# Patient Record
Sex: Female | Born: 1945 | Race: White | Hispanic: No | Marital: Married | Smoking: Former smoker
Health system: Southern US, Community
[De-identification: ages and names within clinical notes are randomized; demographics above are authoritative.]

## PROBLEM LIST (undated history)

## (undated) DIAGNOSIS — R5381 Other malaise: Secondary | ICD-10-CM

## (undated) DIAGNOSIS — K589 Irritable bowel syndrome without diarrhea: Secondary | ICD-10-CM

## (undated) DIAGNOSIS — R252 Cramp and spasm: Secondary | ICD-10-CM

## (undated) DIAGNOSIS — R569 Unspecified convulsions: Secondary | ICD-10-CM

## (undated) DIAGNOSIS — G4700 Insomnia, unspecified: Secondary | ICD-10-CM

## (undated) DIAGNOSIS — E119 Type 2 diabetes mellitus without complications: Secondary | ICD-10-CM

## (undated) DIAGNOSIS — Z8619 Personal history of other infectious and parasitic diseases: Secondary | ICD-10-CM

## (undated) DIAGNOSIS — IMO0001 Reserved for inherently not codable concepts without codable children: Secondary | ICD-10-CM

## (undated) DIAGNOSIS — C50919 Malignant neoplasm of unspecified site of unspecified female breast: Secondary | ICD-10-CM

## (undated) DIAGNOSIS — J069 Acute upper respiratory infection, unspecified: Secondary | ICD-10-CM

## (undated) DIAGNOSIS — R5383 Other fatigue: Secondary | ICD-10-CM

## (undated) DIAGNOSIS — F329 Major depressive disorder, single episode, unspecified: Secondary | ICD-10-CM

## (undated) DIAGNOSIS — Z853 Personal history of malignant neoplasm of breast: Secondary | ICD-10-CM

## (undated) DIAGNOSIS — I10 Essential (primary) hypertension: Secondary | ICD-10-CM

## (undated) DIAGNOSIS — K648 Other hemorrhoids: Secondary | ICD-10-CM

## (undated) DIAGNOSIS — K222 Esophageal obstruction: Secondary | ICD-10-CM

## (undated) DIAGNOSIS — J189 Pneumonia, unspecified organism: Secondary | ICD-10-CM

## (undated) DIAGNOSIS — K5792 Diverticulitis of intestine, part unspecified, without perforation or abscess without bleeding: Secondary | ICD-10-CM

## (undated) DIAGNOSIS — E538 Deficiency of other specified B group vitamins: Secondary | ICD-10-CM

## (undated) DIAGNOSIS — R079 Chest pain, unspecified: Secondary | ICD-10-CM

## (undated) DIAGNOSIS — G2581 Restless legs syndrome: Secondary | ICD-10-CM

## (undated) DIAGNOSIS — F3289 Other specified depressive episodes: Secondary | ICD-10-CM

## (undated) DIAGNOSIS — M1990 Unspecified osteoarthritis, unspecified site: Secondary | ICD-10-CM

## (undated) DIAGNOSIS — R51 Headache: Secondary | ICD-10-CM

## (undated) DIAGNOSIS — IMO0002 Reserved for concepts with insufficient information to code with codable children: Secondary | ICD-10-CM

## (undated) DIAGNOSIS — M792 Neuralgia and neuritis, unspecified: Secondary | ICD-10-CM

## (undated) DIAGNOSIS — K449 Diaphragmatic hernia without obstruction or gangrene: Secondary | ICD-10-CM

## (undated) DIAGNOSIS — F411 Generalized anxiety disorder: Secondary | ICD-10-CM

## (undated) HISTORY — DX: Deficiency of other specified B group vitamins: E53.8

## (undated) HISTORY — DX: Acute upper respiratory infection, unspecified: J06.9

## (undated) HISTORY — DX: Restless legs syndrome: G25.81

## (undated) HISTORY — DX: Diaphragmatic hernia without obstruction or gangrene: K44.9

## (undated) HISTORY — PX: ABDOMINAL HYSTERECTOMY: SHX81

## (undated) HISTORY — DX: Neuralgia and neuritis, unspecified: M79.2

## (undated) HISTORY — DX: Generalized anxiety disorder: F41.1

## (undated) HISTORY — DX: Other specified depressive episodes: F32.89

## (undated) HISTORY — DX: Reserved for inherently not codable concepts without codable children: IMO0001

## (undated) HISTORY — PX: MASTECTOMY: SHX3

## (undated) HISTORY — DX: Unspecified osteoarthritis, unspecified site: M19.90

## (undated) HISTORY — DX: Chest pain, unspecified: R07.9

## (undated) HISTORY — DX: Personal history of other infectious and parasitic diseases: Z86.19

## (undated) HISTORY — DX: Esophageal obstruction: K22.2

## (undated) HISTORY — DX: Insomnia, unspecified: G47.00

## (undated) HISTORY — PX: OTHER SURGICAL HISTORY: SHX169

## (undated) HISTORY — PX: TONSILLECTOMY: SUR1361

## (undated) HISTORY — DX: Other malaise: R53.81

## (undated) HISTORY — DX: Other hemorrhoids: K64.8

## (undated) HISTORY — DX: Type 2 diabetes mellitus without complications: E11.9

## (undated) HISTORY — DX: Major depressive disorder, single episode, unspecified: F32.9

## (undated) HISTORY — DX: Malignant neoplasm of unspecified site of unspecified female breast: C50.919

## (undated) HISTORY — DX: Diverticulitis of intestine, part unspecified, without perforation or abscess without bleeding: K57.92

## (undated) HISTORY — DX: Essential (primary) hypertension: I10

## (undated) HISTORY — DX: Cramp and spasm: R25.2

## (undated) HISTORY — DX: Other fatigue: R53.83

## (undated) HISTORY — DX: Irritable bowel syndrome, unspecified: K58.9

## (undated) HISTORY — PX: CHOLECYSTECTOMY: SHX55

## (undated) HISTORY — DX: Headache: R51

## (undated) HISTORY — DX: Personal history of malignant neoplasm of breast: Z85.3

## (undated) HISTORY — DX: Reserved for concepts with insufficient information to code with codable children: IMO0002

---

## 1998-05-31 ENCOUNTER — Ambulatory Visit: Admission: RE | Admit: 1998-05-31 | Discharge: 1998-05-31 | Payer: Self-pay | Admitting: Pulmonary Disease

## 1998-11-08 ENCOUNTER — Other Ambulatory Visit: Admission: RE | Admit: 1998-11-08 | Discharge: 1998-11-08 | Payer: Self-pay | Admitting: Gastroenterology

## 2001-09-15 ENCOUNTER — Encounter: Payer: Self-pay | Admitting: Internal Medicine

## 2001-09-15 ENCOUNTER — Ambulatory Visit (HOSPITAL_COMMUNITY): Admission: RE | Admit: 2001-09-15 | Discharge: 2001-09-15 | Payer: Self-pay | Admitting: Internal Medicine

## 2002-12-29 ENCOUNTER — Ambulatory Visit (HOSPITAL_COMMUNITY): Admission: RE | Admit: 2002-12-29 | Discharge: 2002-12-29 | Payer: Self-pay | Admitting: Gastroenterology

## 2002-12-29 ENCOUNTER — Encounter (INDEPENDENT_AMBULATORY_CARE_PROVIDER_SITE_OTHER): Payer: Self-pay | Admitting: Specialist

## 2002-12-29 ENCOUNTER — Encounter: Payer: Self-pay | Admitting: Gastroenterology

## 2003-09-09 ENCOUNTER — Encounter: Payer: Self-pay | Admitting: Internal Medicine

## 2003-09-09 ENCOUNTER — Encounter: Admission: RE | Admit: 2003-09-09 | Discharge: 2003-09-09 | Payer: Self-pay | Admitting: Internal Medicine

## 2004-08-16 ENCOUNTER — Ambulatory Visit (HOSPITAL_COMMUNITY): Admission: RE | Admit: 2004-08-16 | Discharge: 2004-08-16 | Payer: Self-pay | Admitting: Internal Medicine

## 2005-05-09 ENCOUNTER — Ambulatory Visit: Payer: Self-pay | Admitting: Internal Medicine

## 2005-06-05 ENCOUNTER — Ambulatory Visit (HOSPITAL_COMMUNITY): Admission: RE | Admit: 2005-06-05 | Discharge: 2005-06-05 | Payer: Self-pay | Admitting: Neurology

## 2005-07-10 ENCOUNTER — Ambulatory Visit: Payer: Self-pay | Admitting: Internal Medicine

## 2005-09-19 ENCOUNTER — Ambulatory Visit: Payer: Self-pay | Admitting: Internal Medicine

## 2006-08-19 ENCOUNTER — Ambulatory Visit: Payer: Self-pay | Admitting: Internal Medicine

## 2006-08-19 ENCOUNTER — Observation Stay (HOSPITAL_COMMUNITY): Admission: AD | Admit: 2006-08-19 | Discharge: 2006-08-20 | Payer: Self-pay | Admitting: Internal Medicine

## 2006-08-19 ENCOUNTER — Ambulatory Visit: Payer: Self-pay | Admitting: Cardiology

## 2006-08-25 ENCOUNTER — Ambulatory Visit: Payer: Self-pay

## 2006-09-02 ENCOUNTER — Ambulatory Visit: Payer: Self-pay | Admitting: Cardiology

## 2006-09-21 ENCOUNTER — Encounter: Admission: RE | Admit: 2006-09-21 | Discharge: 2006-09-21 | Payer: Self-pay | Admitting: Radiology

## 2006-09-24 ENCOUNTER — Encounter (INDEPENDENT_AMBULATORY_CARE_PROVIDER_SITE_OTHER): Payer: Self-pay | Admitting: Specialist

## 2006-09-24 ENCOUNTER — Encounter: Admission: RE | Admit: 2006-09-24 | Discharge: 2006-09-24 | Payer: Self-pay | Admitting: Radiology

## 2006-11-03 ENCOUNTER — Ambulatory Visit (HOSPITAL_COMMUNITY): Admission: RE | Admit: 2006-11-03 | Discharge: 2006-11-05 | Payer: Self-pay | Admitting: Surgery

## 2006-11-03 ENCOUNTER — Encounter (INDEPENDENT_AMBULATORY_CARE_PROVIDER_SITE_OTHER): Payer: Self-pay | Admitting: Specialist

## 2006-11-03 ENCOUNTER — Encounter (INDEPENDENT_AMBULATORY_CARE_PROVIDER_SITE_OTHER): Payer: Self-pay | Admitting: Surgery

## 2006-11-18 ENCOUNTER — Ambulatory Visit: Payer: Self-pay | Admitting: Oncology

## 2006-12-11 LAB — COMPREHENSIVE METABOLIC PANEL
ALT: 16 U/L (ref 0–35)
Albumin: 4.4 g/dL (ref 3.5–5.2)
CO2: 27 mEq/L (ref 19–32)
Calcium: 9.3 mg/dL (ref 8.4–10.5)
Chloride: 106 mEq/L (ref 96–112)
Sodium: 142 mEq/L (ref 135–145)
Total Protein: 6.5 g/dL (ref 6.0–8.3)

## 2006-12-11 LAB — CBC WITH DIFFERENTIAL/PLATELET
BASO%: 0.4 % (ref 0.0–2.0)
Eosinophils Absolute: 0.2 10*3/uL (ref 0.0–0.5)
HCT: 38.4 % (ref 34.8–46.6)
MCHC: 34.2 g/dL (ref 32.0–36.0)
MONO#: 0.3 10*3/uL (ref 0.1–0.9)
NEUT#: 2.9 10*3/uL (ref 1.5–6.5)
RBC: 4.29 10*6/uL (ref 3.70–5.32)
WBC: 5.3 10*3/uL (ref 3.9–10.0)
lymph#: 1.8 10*3/uL (ref 0.9–3.3)

## 2006-12-11 LAB — CANCER ANTIGEN 27.29: CA 27.29: 27 U/mL (ref 0–39)

## 2007-03-09 ENCOUNTER — Ambulatory Visit: Payer: Self-pay | Admitting: Oncology

## 2007-03-12 LAB — CBC WITH DIFFERENTIAL/PLATELET
BASO%: 0.2 % (ref 0.0–2.0)
Basophils Absolute: 0 10*3/uL (ref 0.0–0.1)
EOS%: 1.7 % (ref 0.0–7.0)
MCH: 30.4 pg (ref 26.0–34.0)
MCHC: 34.6 g/dL (ref 32.0–36.0)
MCV: 87.9 fL (ref 81.0–101.0)
MONO%: 6.5 % (ref 0.0–13.0)
RBC: 4.53 10*6/uL (ref 3.70–5.32)
RDW: 13.5 % (ref 11.3–14.5)

## 2007-03-12 LAB — COMPREHENSIVE METABOLIC PANEL
AST: 25 U/L (ref 0–37)
Albumin: 4.2 g/dL (ref 3.5–5.2)
Alkaline Phosphatase: 68 U/L (ref 39–117)
BUN: 28 mg/dL — ABNORMAL HIGH (ref 6–23)
Potassium: 4 mEq/L (ref 3.5–5.3)

## 2007-05-20 ENCOUNTER — Ambulatory Visit: Payer: Self-pay | Admitting: Internal Medicine

## 2007-05-21 ENCOUNTER — Encounter: Payer: Self-pay | Admitting: Internal Medicine

## 2007-05-21 LAB — CONVERTED CEMR LAB
ALT: 20 units/L (ref 0–35)
Albumin: 3.3 g/dL — ABNORMAL LOW (ref 3.5–5.2)
Alkaline Phosphatase: 55 units/L (ref 39–117)
BUN: 22 mg/dL (ref 6–23)
Basophils Relative: 0.3 % (ref 0.0–1.0)
CO2: 29 meq/L (ref 19–32)
Calcium: 8.9 mg/dL (ref 8.4–10.5)
Crystals: NEGATIVE
Eosinophils Absolute: 0.1 10*3/uL (ref 0.0–0.6)
Eosinophils Relative: 2.5 % (ref 0.0–5.0)
GFR calc Af Amer: 81 mL/min
GFR calc non Af Amer: 67 mL/min
Ketones, ur: NEGATIVE mg/dL
LDL Cholesterol: 102 mg/dL — ABNORMAL HIGH (ref 0–99)
Mucus, UA: NEGATIVE
Platelets: 227 10*3/uL (ref 150–400)
RBC: 4.33 M/uL (ref 3.87–5.11)
RDW: 12.8 % (ref 11.5–14.6)
Total CHOL/HDL Ratio: 4.8
Triglycerides: 118 mg/dL (ref 0–149)
VLDL: 24 mg/dL (ref 0–40)
Vit D, 1,25-Dihydroxy: 41 (ref 20–57)
WBC: 5.5 10*3/uL (ref 4.5–10.5)

## 2007-06-01 ENCOUNTER — Ambulatory Visit: Payer: Self-pay | Admitting: Oncology

## 2007-06-03 LAB — CBC WITH DIFFERENTIAL/PLATELET
Basophils Absolute: 0 10*3/uL (ref 0.0–0.1)
EOS%: 1.1 % (ref 0.0–7.0)
HCT: 39.7 % (ref 34.8–46.6)
HGB: 13.9 g/dL (ref 11.6–15.9)
MCH: 31.1 pg (ref 26.0–34.0)
MCV: 88.4 fL (ref 81.0–101.0)
MONO%: 5.2 % (ref 0.0–13.0)
NEUT%: 62.7 % (ref 39.6–76.8)
RDW: 13.6 % (ref 11.3–14.5)

## 2007-06-03 LAB — COMPREHENSIVE METABOLIC PANEL
AST: 19 U/L (ref 0–37)
Alkaline Phosphatase: 59 U/L (ref 39–117)
BUN: 21 mg/dL (ref 6–23)
Creatinine, Ser: 1.28 mg/dL — ABNORMAL HIGH (ref 0.40–1.20)
Total Bilirubin: 0.6 mg/dL (ref 0.3–1.2)

## 2007-06-03 LAB — APTT: aPTT: 28 seconds (ref 24–37)

## 2007-06-09 ENCOUNTER — Inpatient Hospital Stay (HOSPITAL_COMMUNITY): Admission: RE | Admit: 2007-06-09 | Discharge: 2007-06-12 | Payer: Self-pay | Admitting: Plastic Surgery

## 2007-07-23 ENCOUNTER — Encounter: Payer: Self-pay | Admitting: Internal Medicine

## 2007-07-23 DIAGNOSIS — G2581 Restless legs syndrome: Secondary | ICD-10-CM | POA: Insufficient documentation

## 2007-07-23 DIAGNOSIS — IMO0002 Reserved for concepts with insufficient information to code with codable children: Secondary | ICD-10-CM | POA: Insufficient documentation

## 2007-07-23 DIAGNOSIS — M797 Fibromyalgia: Secondary | ICD-10-CM | POA: Insufficient documentation

## 2007-07-23 DIAGNOSIS — G4700 Insomnia, unspecified: Secondary | ICD-10-CM | POA: Insufficient documentation

## 2007-07-23 DIAGNOSIS — I10 Essential (primary) hypertension: Secondary | ICD-10-CM | POA: Insufficient documentation

## 2007-09-08 ENCOUNTER — Ambulatory Visit: Payer: Self-pay | Admitting: Oncology

## 2007-09-10 ENCOUNTER — Encounter: Payer: Self-pay | Admitting: Internal Medicine

## 2007-09-10 LAB — CBC WITH DIFFERENTIAL/PLATELET
Basophils Absolute: 0 10*3/uL (ref 0.0–0.1)
Eosinophils Absolute: 0.1 10*3/uL (ref 0.0–0.5)
HGB: 12.7 g/dL (ref 11.6–15.9)
LYMPH%: 27.4 % (ref 14.0–48.0)
MCV: 85.9 fL (ref 81.0–101.0)
MONO#: 0.3 10*3/uL (ref 0.1–0.9)
MONO%: 5.9 % (ref 0.0–13.0)
NEUT#: 3.8 10*3/uL (ref 1.5–6.5)
Platelets: 255 10*3/uL (ref 145–400)
RBC: 4.38 10*6/uL (ref 3.70–5.32)
RDW: 15.3 % — ABNORMAL HIGH (ref 11.3–14.5)
WBC: 5.9 10*3/uL (ref 3.9–10.0)

## 2007-11-25 ENCOUNTER — Ambulatory Visit: Payer: Self-pay | Admitting: Internal Medicine

## 2007-11-25 DIAGNOSIS — R252 Cramp and spasm: Secondary | ICD-10-CM | POA: Insufficient documentation

## 2007-11-25 DIAGNOSIS — F32A Depression, unspecified: Secondary | ICD-10-CM | POA: Insufficient documentation

## 2007-11-25 DIAGNOSIS — F329 Major depressive disorder, single episode, unspecified: Secondary | ICD-10-CM

## 2007-11-25 DIAGNOSIS — Z853 Personal history of malignant neoplasm of breast: Secondary | ICD-10-CM | POA: Insufficient documentation

## 2007-11-25 DIAGNOSIS — F419 Anxiety disorder, unspecified: Secondary | ICD-10-CM | POA: Insufficient documentation

## 2007-11-25 DIAGNOSIS — M1990 Unspecified osteoarthritis, unspecified site: Secondary | ICD-10-CM | POA: Insufficient documentation

## 2007-11-25 DIAGNOSIS — F411 Generalized anxiety disorder: Secondary | ICD-10-CM

## 2007-12-03 ENCOUNTER — Encounter: Admission: RE | Admit: 2007-12-03 | Discharge: 2007-12-03 | Payer: Self-pay | Admitting: Oncology

## 2008-02-11 ENCOUNTER — Ambulatory Visit: Payer: Self-pay | Admitting: Internal Medicine

## 2008-02-11 LAB — CONVERTED CEMR LAB
ALT: 18 units/L (ref 0–35)
Albumin: 3.3 g/dL — ABNORMAL LOW (ref 3.5–5.2)
Alkaline Phosphatase: 55 units/L (ref 39–117)
BUN: 20 mg/dL (ref 6–23)
CO2: 30 meq/L (ref 19–32)
Calcium: 8.6 mg/dL (ref 8.4–10.5)
Eosinophils Relative: 2.7 % (ref 0.0–5.0)
GFR calc Af Amer: 64 mL/min
Glucose, Bld: 131 mg/dL — ABNORMAL HIGH (ref 70–99)
HCT: 41.3 % (ref 36.0–46.0)
Hemoglobin: 13.3 g/dL (ref 12.0–15.0)
Monocytes Absolute: 0.4 10*3/uL (ref 0.2–0.7)
Monocytes Relative: 5.9 % (ref 3.0–11.0)
Neutro Abs: 4.1 10*3/uL (ref 1.4–7.7)
RBC: 4.6 M/uL (ref 3.87–5.11)
Total Protein: 6 g/dL (ref 6.0–8.3)
WBC: 6.9 10*3/uL (ref 4.5–10.5)

## 2008-02-23 ENCOUNTER — Ambulatory Visit: Payer: Self-pay | Admitting: Internal Medicine

## 2008-03-08 ENCOUNTER — Ambulatory Visit: Payer: Self-pay | Admitting: Oncology

## 2008-03-10 ENCOUNTER — Encounter: Payer: Self-pay | Admitting: Internal Medicine

## 2008-03-10 LAB — COMPREHENSIVE METABOLIC PANEL
Albumin: 4 g/dL (ref 3.5–5.2)
BUN: 18 mg/dL (ref 6–23)
CO2: 21 mEq/L (ref 19–32)
Glucose, Bld: 143 mg/dL — ABNORMAL HIGH (ref 70–99)
Sodium: 142 mEq/L (ref 135–145)
Total Bilirubin: 0.6 mg/dL (ref 0.3–1.2)
Total Protein: 6.7 g/dL (ref 6.0–8.3)

## 2008-03-10 LAB — CBC WITH DIFFERENTIAL/PLATELET
Basophils Absolute: 0 10*3/uL (ref 0.0–0.1)
Eosinophils Absolute: 0.1 10*3/uL (ref 0.0–0.5)
HCT: 40.7 % (ref 34.8–46.6)
HGB: 13.9 g/dL (ref 11.6–15.9)
LYMPH%: 28.2 % (ref 14.0–48.0)
MCV: 86.7 fL (ref 81.0–101.0)
MONO#: 0.3 10*3/uL (ref 0.1–0.9)
MONO%: 4.1 % (ref 0.0–13.0)
NEUT#: 4.3 10*3/uL (ref 1.5–6.5)
Platelets: 247 10*3/uL (ref 145–400)
RBC: 4.69 10*6/uL (ref 3.70–5.32)
WBC: 6.6 10*3/uL (ref 3.9–10.0)

## 2008-03-10 LAB — CANCER ANTIGEN 27.29: CA 27.29: 27 U/mL (ref 0–39)

## 2008-05-05 ENCOUNTER — Ambulatory Visit: Payer: Self-pay | Admitting: Internal Medicine

## 2008-05-05 LAB — CONVERTED CEMR LAB
BUN: 21 mg/dL (ref 6–23)
Calcium: 8.7 mg/dL (ref 8.4–10.5)
GFR calc Af Amer: 64 mL/min
GFR calc non Af Amer: 53 mL/min
Potassium: 4.3 meq/L (ref 3.5–5.1)
Sodium: 144 meq/L (ref 135–145)
Vitamin B-12: 668 pg/mL (ref 211–911)

## 2008-05-11 ENCOUNTER — Ambulatory Visit: Payer: Self-pay | Admitting: Internal Medicine

## 2008-08-16 ENCOUNTER — Ambulatory Visit: Payer: Self-pay | Admitting: Internal Medicine

## 2008-08-16 DIAGNOSIS — D510 Vitamin B12 deficiency anemia due to intrinsic factor deficiency: Secondary | ICD-10-CM | POA: Insufficient documentation

## 2008-08-16 DIAGNOSIS — R5382 Chronic fatigue, unspecified: Secondary | ICD-10-CM | POA: Insufficient documentation

## 2008-08-22 LAB — CONVERTED CEMR LAB
ALT: 22 units/L (ref 0–35)
AST: 27 units/L (ref 0–37)
Albumin: 3.7 g/dL (ref 3.5–5.2)
Alkaline Phosphatase: 57 units/L (ref 39–117)
BUN: 18 mg/dL (ref 6–23)
Bacteria, UA: NEGATIVE
Bilirubin Urine: NEGATIVE
CO2: 29 meq/L (ref 19–32)
Chloride: 105 meq/L (ref 96–112)
Creatinine, Ser: 1 mg/dL (ref 0.4–1.2)
Glucose, Bld: 124 mg/dL — ABNORMAL HIGH (ref 70–99)
Ketones, ur: NEGATIVE mg/dL
Leukocytes, UA: NEGATIVE
Potassium: 4.3 meq/L (ref 3.5–5.1)
Specific Gravity, Urine: 1.02 (ref 1.000–1.03)
Urine Glucose: NEGATIVE mg/dL
Vitamin B-12: 615 pg/mL (ref 211–911)
pH: 6 (ref 5.0–8.0)

## 2008-09-20 ENCOUNTER — Encounter: Payer: Self-pay | Admitting: Internal Medicine

## 2008-10-18 ENCOUNTER — Telehealth: Payer: Self-pay | Admitting: Internal Medicine

## 2008-11-01 ENCOUNTER — Ambulatory Visit: Payer: Self-pay | Admitting: Internal Medicine

## 2008-11-01 DIAGNOSIS — R0789 Other chest pain: Secondary | ICD-10-CM | POA: Insufficient documentation

## 2008-11-01 DIAGNOSIS — R079 Chest pain, unspecified: Secondary | ICD-10-CM

## 2008-11-01 DIAGNOSIS — J069 Acute upper respiratory infection, unspecified: Secondary | ICD-10-CM | POA: Insufficient documentation

## 2008-11-22 ENCOUNTER — Telehealth: Payer: Self-pay | Admitting: Internal Medicine

## 2008-11-23 ENCOUNTER — Telehealth: Payer: Self-pay | Admitting: Internal Medicine

## 2008-11-23 DIAGNOSIS — K648 Other hemorrhoids: Secondary | ICD-10-CM | POA: Insufficient documentation

## 2008-11-23 DIAGNOSIS — K222 Esophageal obstruction: Secondary | ICD-10-CM | POA: Insufficient documentation

## 2008-11-23 DIAGNOSIS — K589 Irritable bowel syndrome without diarrhea: Secondary | ICD-10-CM | POA: Insufficient documentation

## 2008-11-23 DIAGNOSIS — K449 Diaphragmatic hernia without obstruction or gangrene: Secondary | ICD-10-CM | POA: Insufficient documentation

## 2008-11-24 ENCOUNTER — Ambulatory Visit: Payer: Self-pay | Admitting: Gastroenterology

## 2008-11-25 ENCOUNTER — Telehealth (INDEPENDENT_AMBULATORY_CARE_PROVIDER_SITE_OTHER): Payer: Self-pay | Admitting: *Deleted

## 2008-11-30 ENCOUNTER — Ambulatory Visit: Payer: Self-pay | Admitting: Gastroenterology

## 2008-11-30 ENCOUNTER — Encounter: Payer: Self-pay | Admitting: Gastroenterology

## 2008-11-30 LAB — CONVERTED CEMR LAB
Creatinine, Ser: 1 mg/dL (ref 0.4–1.2)
UREASE: NEGATIVE

## 2008-12-01 ENCOUNTER — Telehealth: Payer: Self-pay | Admitting: Gastroenterology

## 2008-12-02 ENCOUNTER — Encounter: Payer: Self-pay | Admitting: Gastroenterology

## 2008-12-05 ENCOUNTER — Encounter: Payer: Self-pay | Admitting: Gastroenterology

## 2008-12-06 ENCOUNTER — Encounter: Payer: Self-pay | Admitting: Internal Medicine

## 2008-12-06 ENCOUNTER — Telehealth: Payer: Self-pay | Admitting: Internal Medicine

## 2008-12-06 ENCOUNTER — Ambulatory Visit: Payer: Self-pay | Admitting: Cardiology

## 2008-12-07 ENCOUNTER — Encounter: Payer: Self-pay | Admitting: Gastroenterology

## 2008-12-07 DIAGNOSIS — K8020 Calculus of gallbladder without cholecystitis without obstruction: Secondary | ICD-10-CM | POA: Insufficient documentation

## 2008-12-08 ENCOUNTER — Encounter: Payer: Self-pay | Admitting: Internal Medicine

## 2008-12-12 ENCOUNTER — Telehealth: Payer: Self-pay | Admitting: Gastroenterology

## 2008-12-28 ENCOUNTER — Ambulatory Visit (HOSPITAL_COMMUNITY): Admission: RE | Admit: 2008-12-28 | Discharge: 2008-12-29 | Payer: Self-pay | Admitting: Surgery

## 2008-12-28 ENCOUNTER — Encounter (INDEPENDENT_AMBULATORY_CARE_PROVIDER_SITE_OTHER): Payer: Self-pay | Admitting: Surgery

## 2009-01-27 ENCOUNTER — Encounter: Payer: Self-pay | Admitting: Internal Medicine

## 2009-01-27 ENCOUNTER — Encounter: Payer: Self-pay | Admitting: Gastroenterology

## 2009-02-08 ENCOUNTER — Ambulatory Visit: Payer: Self-pay | Admitting: Internal Medicine

## 2009-02-08 ENCOUNTER — Telehealth: Payer: Self-pay | Admitting: Gastroenterology

## 2009-03-10 ENCOUNTER — Ambulatory Visit: Payer: Self-pay | Admitting: Oncology

## 2009-03-14 ENCOUNTER — Encounter: Payer: Self-pay | Admitting: Internal Medicine

## 2009-03-14 LAB — COMPREHENSIVE METABOLIC PANEL
Alkaline Phosphatase: 63 U/L (ref 39–117)
BUN: 19 mg/dL (ref 6–23)
CO2: 27 mEq/L (ref 19–32)
Creatinine, Ser: 0.96 mg/dL (ref 0.40–1.20)
Glucose, Bld: 131 mg/dL — ABNORMAL HIGH (ref 70–99)
Sodium: 143 mEq/L (ref 135–145)
Total Bilirubin: 0.6 mg/dL (ref 0.3–1.2)
Total Protein: 6.2 g/dL (ref 6.0–8.3)

## 2009-03-14 LAB — CBC WITH DIFFERENTIAL/PLATELET
Eosinophils Absolute: 0.1 10*3/uL (ref 0.0–0.5)
HCT: 38.5 % (ref 34.8–46.6)
LYMPH%: 31.6 % (ref 14.0–49.7)
MCV: 92.1 fL (ref 79.5–101.0)
MONO#: 0.3 10*3/uL (ref 0.1–0.9)
MONO%: 5.4 % (ref 0.0–14.0)
NEUT#: 3 10*3/uL (ref 1.5–6.5)
NEUT%: 59.5 % (ref 38.4–76.8)
Platelets: 210 10*3/uL (ref 145–400)
RBC: 4.19 10*6/uL (ref 3.70–5.45)
WBC: 5 10*3/uL (ref 3.9–10.3)

## 2009-03-27 ENCOUNTER — Telehealth: Payer: Self-pay | Admitting: Internal Medicine

## 2009-03-28 ENCOUNTER — Ambulatory Visit: Payer: Self-pay | Admitting: Internal Medicine

## 2009-03-28 DIAGNOSIS — M542 Cervicalgia: Secondary | ICD-10-CM | POA: Insufficient documentation

## 2009-03-31 ENCOUNTER — Telehealth: Payer: Self-pay | Admitting: Internal Medicine

## 2009-04-13 ENCOUNTER — Telehealth: Payer: Self-pay | Admitting: Internal Medicine

## 2009-05-11 ENCOUNTER — Telehealth: Payer: Self-pay | Admitting: Internal Medicine

## 2009-05-31 ENCOUNTER — Ambulatory Visit: Payer: Self-pay | Admitting: Internal Medicine

## 2009-05-31 LAB — CONVERTED CEMR LAB
AST: 27 units/L (ref 0–37)
Albumin: 3.7 g/dL (ref 3.5–5.2)
Alkaline Phosphatase: 58 units/L (ref 39–117)
Basophils Absolute: 0 10*3/uL (ref 0.0–0.1)
Basophils Relative: 0.4 % (ref 0.0–3.0)
Bilirubin, Direct: 0.2 mg/dL (ref 0.0–0.3)
CO2: 31 meq/L (ref 19–32)
Calcium: 8.9 mg/dL (ref 8.4–10.5)
Eosinophils Absolute: 0.1 10*3/uL (ref 0.0–0.7)
GFR calc non Af Amer: 66.7 mL/min (ref >60)
Glucose, Bld: 116 mg/dL — ABNORMAL HIGH (ref 70–99)
Hemoglobin: 13.7 g/dL (ref 12.0–15.0)
MCHC: 34.6 g/dL (ref 30.0–36.0)
MCV: 91.4 fL (ref 78.0–100.0)
Monocytes Absolute: 0.3 10*3/uL (ref 0.1–1.0)
Neutro Abs: 3.3 10*3/uL (ref 1.4–7.7)
Potassium: 4.2 meq/L (ref 3.5–5.1)
RBC: 4.34 M/uL (ref 3.87–5.11)
RDW: 12.7 % (ref 11.5–14.6)
Sodium: 145 meq/L (ref 135–145)
Total Protein: 6.3 g/dL (ref 6.0–8.3)

## 2009-06-06 ENCOUNTER — Ambulatory Visit: Payer: Self-pay | Admitting: Internal Medicine

## 2009-06-07 ENCOUNTER — Telehealth: Payer: Self-pay | Admitting: Internal Medicine

## 2009-06-09 ENCOUNTER — Telehealth (INDEPENDENT_AMBULATORY_CARE_PROVIDER_SITE_OTHER): Payer: Self-pay | Admitting: *Deleted

## 2009-07-19 ENCOUNTER — Ambulatory Visit: Payer: Self-pay | Admitting: Internal Medicine

## 2009-08-17 ENCOUNTER — Telehealth: Payer: Self-pay | Admitting: Internal Medicine

## 2009-08-31 ENCOUNTER — Telehealth: Payer: Self-pay | Admitting: Internal Medicine

## 2009-09-26 ENCOUNTER — Ambulatory Visit: Payer: Self-pay | Admitting: Internal Medicine

## 2009-10-16 ENCOUNTER — Telehealth: Payer: Self-pay | Admitting: Internal Medicine

## 2009-10-18 ENCOUNTER — Ambulatory Visit: Payer: Self-pay | Admitting: Endocrinology

## 2009-10-20 ENCOUNTER — Telehealth (INDEPENDENT_AMBULATORY_CARE_PROVIDER_SITE_OTHER): Payer: Self-pay | Admitting: *Deleted

## 2009-10-23 ENCOUNTER — Ambulatory Visit: Payer: Self-pay | Admitting: Internal Medicine

## 2009-12-12 ENCOUNTER — Ambulatory Visit: Payer: Self-pay | Admitting: Internal Medicine

## 2009-12-25 ENCOUNTER — Ambulatory Visit (HOSPITAL_BASED_OUTPATIENT_CLINIC_OR_DEPARTMENT_OTHER): Payer: Medicare Other | Admitting: Oncology

## 2009-12-27 LAB — CBC WITH DIFFERENTIAL/PLATELET
BASO%: 0.4 % (ref 0.0–2.0)
Basophils Absolute: 0 10*3/uL (ref 0.0–0.1)
Eosinophils Absolute: 0.1 10*3/uL (ref 0.0–0.5)
HCT: 40.3 % (ref 34.8–46.6)
HGB: 13.8 g/dL (ref 11.6–15.9)
MONO#: 0.3 10*3/uL (ref 0.1–0.9)
NEUT#: 3.7 10*3/uL (ref 1.5–6.5)
NEUT%: 64.3 % (ref 38.4–76.8)
Platelets: 220 10*3/uL (ref 145–400)
WBC: 5.7 10*3/uL (ref 3.9–10.3)
lymph#: 1.6 10*3/uL (ref 0.9–3.3)

## 2009-12-27 LAB — COMPREHENSIVE METABOLIC PANEL
ALT: 16 U/L (ref 0–35)
CO2: 29 mEq/L (ref 19–32)
Calcium: 8.6 mg/dL (ref 8.4–10.5)
Chloride: 113 mEq/L — ABNORMAL HIGH (ref 96–112)
Creatinine, Ser: 0.96 mg/dL (ref 0.40–1.20)
Glucose, Bld: 69 mg/dL — ABNORMAL LOW (ref 70–99)

## 2009-12-27 LAB — CANCER ANTIGEN 27.29: CA 27.29: 13 U/mL (ref 0–39)

## 2010-01-01 ENCOUNTER — Encounter: Payer: Self-pay | Admitting: Internal Medicine

## 2010-01-09 ENCOUNTER — Encounter: Payer: Self-pay | Admitting: Internal Medicine

## 2010-01-29 ENCOUNTER — Ambulatory Visit: Payer: Self-pay | Admitting: Internal Medicine

## 2010-01-29 LAB — CONVERTED CEMR LAB
BUN: 12 mg/dL (ref 6–23)
Calcium: 8.7 mg/dL (ref 8.4–10.5)
Creatinine, Ser: 0.9 mg/dL (ref 0.4–1.2)
GFR calc non Af Amer: 66.57 mL/min (ref >60)
Glucose, Bld: 92 mg/dL (ref 70–99)
TSH: 1.89 microintl units/mL (ref 0.35–5.50)

## 2010-02-06 ENCOUNTER — Ambulatory Visit: Payer: Self-pay | Admitting: Internal Medicine

## 2010-02-06 DIAGNOSIS — R42 Dizziness and giddiness: Secondary | ICD-10-CM | POA: Insufficient documentation

## 2010-02-26 ENCOUNTER — Telehealth: Payer: Self-pay | Admitting: Gastroenterology

## 2010-05-09 ENCOUNTER — Ambulatory Visit: Payer: Self-pay | Admitting: Internal Medicine

## 2010-05-09 DIAGNOSIS — R1013 Epigastric pain: Secondary | ICD-10-CM | POA: Insufficient documentation

## 2010-05-09 DIAGNOSIS — R350 Frequency of micturition: Secondary | ICD-10-CM | POA: Insufficient documentation

## 2010-05-09 DIAGNOSIS — R634 Abnormal weight loss: Secondary | ICD-10-CM | POA: Insufficient documentation

## 2010-05-14 ENCOUNTER — Telehealth: Payer: Self-pay | Admitting: Internal Medicine

## 2010-05-14 LAB — CONVERTED CEMR LAB
BUN: 23 mg/dL (ref 6–23)
CO2: 30 meq/L (ref 19–32)
Chloride: 111 meq/L (ref 96–112)
Glucose, Bld: 88 mg/dL (ref 70–99)
Potassium: 4 meq/L (ref 3.5–5.1)
Specific Gravity, Urine: 1.025 (ref 1.000–1.030)
Urine Glucose: NEGATIVE mg/dL
Urobilinogen, UA: 0.2 (ref 0.0–1.0)
Vitamin B-12: 567 pg/mL (ref 211–911)
pH: 5.5 (ref 5.0–8.0)

## 2010-05-22 ENCOUNTER — Ambulatory Visit: Payer: Self-pay | Admitting: Gastroenterology

## 2010-05-28 ENCOUNTER — Telehealth: Payer: Self-pay | Admitting: Gastroenterology

## 2010-06-13 ENCOUNTER — Ambulatory Visit: Payer: Self-pay | Admitting: Internal Medicine

## 2010-06-13 LAB — CONVERTED CEMR LAB
Ketones, ur: NEGATIVE mg/dL
Specific Gravity, Urine: 1.025 (ref 1.000–1.030)
Urine Glucose: NEGATIVE mg/dL
Urobilinogen, UA: 0.2 (ref 0.0–1.0)
pH: 6 (ref 5.0–8.0)

## 2010-06-14 ENCOUNTER — Telehealth: Payer: Self-pay | Admitting: Internal Medicine

## 2010-06-26 ENCOUNTER — Encounter: Payer: Self-pay | Admitting: Internal Medicine

## 2010-06-26 ENCOUNTER — Encounter: Payer: Self-pay | Admitting: Gastroenterology

## 2010-06-27 ENCOUNTER — Telehealth: Payer: Self-pay | Admitting: Internal Medicine

## 2010-07-16 ENCOUNTER — Ambulatory Visit: Payer: Self-pay | Admitting: Internal Medicine

## 2010-07-16 DIAGNOSIS — R109 Unspecified abdominal pain: Secondary | ICD-10-CM | POA: Insufficient documentation

## 2010-07-18 ENCOUNTER — Encounter: Admission: RE | Admit: 2010-07-18 | Discharge: 2010-07-18 | Payer: Self-pay | Admitting: Internal Medicine

## 2010-07-30 ENCOUNTER — Telehealth (INDEPENDENT_AMBULATORY_CARE_PROVIDER_SITE_OTHER): Payer: Self-pay | Admitting: *Deleted

## 2010-08-14 ENCOUNTER — Telehealth: Payer: Self-pay | Admitting: Internal Medicine

## 2010-08-27 ENCOUNTER — Ambulatory Visit: Payer: Self-pay | Admitting: Internal Medicine

## 2010-08-27 DIAGNOSIS — R319 Hematuria, unspecified: Secondary | ICD-10-CM | POA: Insufficient documentation

## 2010-08-28 ENCOUNTER — Encounter: Payer: Self-pay | Admitting: Internal Medicine

## 2010-08-28 LAB — CONVERTED CEMR LAB
Specific Gravity, Urine: 1.025 (ref 1.000–1.030)
Urobilinogen, UA: 0.2 (ref 0.0–1.0)

## 2010-09-11 ENCOUNTER — Telehealth: Payer: Self-pay | Admitting: Internal Medicine

## 2010-09-28 ENCOUNTER — Encounter: Payer: Self-pay | Admitting: Gastroenterology

## 2010-10-17 ENCOUNTER — Ambulatory Visit: Payer: Self-pay | Admitting: Internal Medicine

## 2010-10-17 DIAGNOSIS — D179 Benign lipomatous neoplasm, unspecified: Secondary | ICD-10-CM | POA: Insufficient documentation

## 2010-10-18 LAB — CONVERTED CEMR LAB
AST: 24 units/L (ref 0–37)
Alkaline Phosphatase: 62 units/L (ref 39–117)
BUN: 23 mg/dL (ref 6–23)
Basophils Absolute: 0 10*3/uL (ref 0.0–0.1)
Calcium: 8.8 mg/dL (ref 8.4–10.5)
GFR calc non Af Amer: 59.5 mL/min (ref >60)
Glucose, Bld: 101 mg/dL — ABNORMAL HIGH (ref 70–99)
Ketones, ur: NEGATIVE mg/dL
Lymphocytes Relative: 36.9 % (ref 12.0–46.0)
Lymphs Abs: 2.1 10*3/uL (ref 0.7–4.0)
Monocytes Relative: 5.3 % (ref 3.0–12.0)
Nitrite: NEGATIVE
Platelets: 204 10*3/uL (ref 150.0–400.0)
RDW: 13.6 % (ref 11.5–14.6)
Specific Gravity, Urine: 1.025 (ref 1.000–1.030)
TSH: 2.32 microintl units/mL (ref 0.35–5.50)
Total Bilirubin: 0.7 mg/dL (ref 0.3–1.2)
pH: 6 (ref 5.0–8.0)

## 2010-10-19 ENCOUNTER — Ambulatory Visit: Payer: Self-pay | Admitting: Internal Medicine

## 2010-12-09 ENCOUNTER — Encounter: Payer: Self-pay | Admitting: Neurology

## 2010-12-09 ENCOUNTER — Encounter: Payer: Self-pay | Admitting: Internal Medicine

## 2010-12-18 NOTE — Progress Notes (Signed)
Summary: Triage  Phone Note Call from Patient Call back at Home Phone [phone]   Caller: Patient Call For: Dr. Jarold Motto Reason for Call: Talk to Nurse Summary of Call: pt. is calling about a referral appt. that is to be sch'd at [facility] Initial call taken by: Karna Christmas,  May 28, 2010 10:23 AM  Follow-up for Phone Call        Appt sch for pt to see Dr. Gennie Alma on Aug 9 at 1:30.  New pt paper work will be mailed to her. Follow-up by: Ashok Cordia RN,  May 28, 2010 10:43 AM  Additional Follow-up for Phone Call Additional follow up Details #1::        Pt notified of appt. Additional Follow-up by: Ashok Cordia RN,  May 28, 2010 10:58 AM

## 2010-12-18 NOTE — Progress Notes (Signed)
Summary: Dexilant/Cymbalta  Phone Note Call from Patient Call back at Home Phone [phone]   Summary of Call: Patient was given samples of dexilant at last office visit. Pt says that she did not take the dexilant. She wants to stay on cymbalta 60mg  and needs refill thru mail order pharmacy. Also would like samples if avail.   Ok for mail order refill of cymbalta? Dexilant is not to replace cymbalta correct? It would be alt to nexium?  Initial call taken by: Lamar Sprinkles, CMA,  August 14, 2010 2:45 PM  Follow-up for Phone Call        ok cymbalta samples if we have  Follow-up by: Tresa Garter MD,  August 14, 2010 6:08 PM  Additional Follow-up for Phone Call Additional follow up Details #1::        Left detailed vm on pt's home #, samples and rx is ready Additional Follow-up by: Lamar Sprinkles, CMA,  August 15, 2010 10:57 AM    New/Updated Medications: CYMBALTA 60 MG CPEP (DULOXETINE HCL) 1 by mouth once daily for depression Prescriptions: CYMBALTA 60 MG CPEP (DULOXETINE HCL) 1 by mouth once daily for depression  #90 x 1   Entered and Authorized by:   Tresa Garter MD   Signed by:   Lamar Sprinkles, CMA on 08/15/2010   Method used:   Print then Give to Patient   RxID:   [id number]

## 2010-12-18 NOTE — Assessment & Plan Note (Signed)
Summary: yearly check up...em   History of Present Illness Visit Type: Follow-up Visit Primary GI MD: Sheryn Bison MD FACP FAGA Primary Provider: Sonda Primes, MD Requesting Provider: Sonda Primes, MD Chief Complaint: Acid reflux , having severe chest pain History of Present Illness:   65 year old Caucasian female with multiple medical problems including fibromyalgia, osteoporosis, previous breast carcinoma with bilateral mastectomies, cholelithiasis with laparoscopic cholecystectomy, chronic anxiety and depression, hyperlipidemia, chronic fatigue syndrome, IBS, and a long, long history of noncardiac chest pain with multiple recurrent cardiac and gastrointestinal workups which have been negative.  A year after day she had CT scan of the chest and abdomen that showed cholelithiasis, she underwent laparoscopic cholecystectomy by Dr. Jamey Ripa. She continues with atypical chest pain with a positive review of system in terms of pain with deep breathing, movement, swallowing, eating, etc. etc. nothing seemed to help her chest pain including twice a day Nexium, Carafate suspension, sedatives, and NSAID's,gabapentin, or Librax. Despite continued daily chest pain she has had no anorexia or weight loss.  Previous esophageal manometry and 24-hour pH probe testing years ago was unremarkable. She had endoscopy with esophageal biopsies 18 months ago that was unremarkable. Previous evaluations for age the lower abdomen negative. She has chronic anxiety and depression with recent exacerbation related to environmental situations. She also complains of a bulge in her upper abdomen, lower abdomen, and was told by her surgeon that she may have a common duct stone because of apparently a small" air bubble" seen at the time of operative cholangiogram. She's had no hepatobiliary symptomatology whatsoever.   GI Review of Systems    Reports acid reflux, chest pain, and  weight loss.      Denies abdominal pain,  belching, bloating, dysphagia with liquids, dysphagia with solids, heartburn, loss of appetite, nausea, vomiting, vomiting blood, and  weight gain.        Denies anal fissure, black tarry stools, change in bowel habit, constipation, diarrhea, diverticulosis, fecal incontinence, heme positive stool, hemorrhoids, irritable bowel syndrome, jaundice, light color stool, liver problems, rectal bleeding, and  rectal pain.    Current Medications (verified): 1)  Nexium 40 Mg  Cpdr (Esomeprazole Magnesium) .... Two Times A Day 2)  Tamoxifen Citrate 20 Mg  Tabs (Tamoxifen Citrate) .Marland Kitchen.. 1 By Mouth Qd 3)  Gabapentin 600 Mg  Tabs (Gabapentin) .Marland Kitchen.. 1 By Mouth Tid 4)  Meloxicam 15 Mg  Tabs (Meloxicam) .Marland Kitchen.. 1 By Mouth Once Daily Pc Prn 5)  Ocuvite Adult 50+   Caps (Multiple Vitamins-Minerals) .... Once Daily 6)  Zolpidem Tartrate 10 Mg  Tabs (Zolpidem Tartrate) .... 1/2 or 1 By Mouth At [facility] Prn 7)  Vitamin D3 1000 Unit  Tabs (Cholecalciferol) .Marland Kitchen.. 1 Qd 8)  Carafate 1 Gm Tabs (Sucralfate) .Marland Kitchen.. 1 By Mouth Qid  As Needed 9)  Alprazolam 0.25 Mg Tabs (Alprazolam) .Marland Kitchen.. 1 By Mouth Two Times A Day As Needed 10)  Cobal-1000 1000 Mcg/ml Soln (Cyanocobalamin) .Marland Kitchen.. 1 Ml Every 2 Wks To 1 Mth 11)  Bd Insulin Syringe Ultrafine 29g X 1/2" 1 Ml Misc (Insulin Syringe-Needle U-100) .... As Dirrected For Vit B12 12)  Loratadine 10 Mg  Tabs (Loratadine) .... Once Daily As Needed Allergies 13)  Valtrex 500 Mg Tabs (Valacyclovir Hcl) .Marland Kitchen.. 1 By Mouth Two Times A Day Prn 14)  Losartan Potassium 100 Mg Tabs (Losartan Potassium) .Marland Kitchen.. 1 By Mouth Once Daily For Blood Pressure 15)  Fluoxetine Hcl 20 Mg Tabs (Fluoxetine Hcl) .Marland Kitchen.. 1 By Mouth Qd 16)  Clidinium-Chlordiazepoxide 2.5-5  Mg Caps (Clidinium-Chlordiazepoxide) .Marland Kitchen.. 1 By Mouth Tid  As Needed 17)  Gelnique 10 % Gel (Oxybutynin Chloride) .... Use On Skin Once Daily For Bladder Issues  Allergies (verified): 1)  ! Sodium Lauryl Sulfate (Sodium Lauryl Sulfate) 2)  ! Lovastatin  (Lovastatin) 3)  ! Sulfa 4)  Symbyax (Olanzapine-Fluoxetine Hcl) 5)  Wellbutrin Xl (Bupropion Hcl)  Past History:  Past medical, surgical, family and social histories (including risk factors) reviewed for relevance to current acute and chronic problems.  Past Medical History: Reviewed history from 07/19/2009 and no changes required. Osteoarthritis Chest wall neuralgia, post-op FMS  Current Problems:  HEMORRHOIDS, INTERNAL (ICD-455.0) IBS (ICD-564.1) ESOPHAGEAL STRICTURE (ICD-530.3) HIATAL HERNIA (ICD-553.3) CHEST PAIN, UNSPECIFIED (ICD-786.50) UPPER RESPIRATORY INFECTION (URI) (ICD-465.9) B12 DEFICIENCY (ICD-266.2) FATIGUE (ICD-780.79) OSTEOARTHRITIS (ICD-715.90) CRAMPS,LEG (ICD-729.82) DEPRESSION (ICD-311) BREAST CANCER, HX OF (ICD-V10.3) ANXIETY (ICD-300.00) NEURALGIA (ICD-729.2) SYMPTOM, HEADACHE (ICD-784.0) RESTLESS LEG SYNDROME (ICD-333.94) INSOMNIA (ICD-780.52) FIBROMYALGIA (ICD-729.1) HYPERTENSION (ICD-401.9) Cold sores    Past Surgical History: Hysterectomy Tonsillectomy Mastectomy B  total Cholecystectomy 2010  Family History: Reviewed history from 11/24/2008 and no changes required. Mother: lymphoma B MG, CAD Breast cancer: Paternal grandmother Family History of Diabetes: Mother, Brother, sister Family History of Heart Disease: Sister Family History of Kidney Disease:Mother, brother  Social History: Reviewed history from 05/09/2010 and no changes required. Occupation: Psychologist, forensic - retired again 2011 Married Never Smoked Alcohol Use - no Illicit Drug Use - no Regular exercise-yes walking  Review of Systems       The patient complains of anxiety-new, arthritis/joint pain, confusion, depression-new, and fatigue.  The patient denies allergy/sinus, anemia, back pain, blood in urine, breast changes/lumps, change in vision, cough, coughing up blood, fainting, fever, headaches-new, hearing problems, heart murmur, heart rhythm changes, itching,  menstrual pain, muscle pains/cramps, night sweats, nosebleeds, pregnancy symptoms, shortness of breath, skin rash, sleeping problems, sore throat, swelling of feet/legs, swollen lymph glands, thirst - excessive , urination - excessive , urination changes/pain, urine leakage, vision changes, and voice change.    Vital Signs:  Patient profile:   65 year old female Height:      67 inches Weight:      156 pounds BMI:     24.52 BSA:     1.82 Pulse rate:   68 / minute Pulse rhythm:   regular BP sitting:   102 / 70  (left arm)  Vitals Entered By: Merri Ray CMA Duncan Dull) (May 22, 2010 11:16 AM)  Physical Exam  General:  Well developed, well nourished, no acute distress. Head:  Normocephalic and atraumatic. Eyes:  PERRLA, no icterus.exam deferred to patient's ophthalmologist.   Neck:  Supple; no masses or thyromegaly. Lungs:  Clear throughout to auscultation. Heart:  Regular rate and rhythm; no murmurs, rubs,  or bruits. Abdomen:  Soft, nontender and nondistended. No masses, hepatosplenomegaly or hernias noted. Normal bowel sounds. Extremities:  No clubbing, cyanosis, edema or deformities noted. Neurologic:  Alert and  oriented x4;  grossly normal neurologically. Cervical Nodes:  No significant cervical adenopathy. Psych:  easily distracted, poor concentration, and hyperactive.     Impression & Recommendations:  Problem # 1:  CHEST PAIN, UNSPECIFIED (ICD-786.50) Assessment Deteriorated Noncardiac chest pain refractory to all medical therapy. I placed her on p.r.n. GI cocktail and he requested a second GI opinion from Dr.Koch at [facility] in [place]. Perhaps, she would benefit from 24-hour Bravo pH probe testing and repeat endoscopy and repeat manometry. I do not think that her current symptomatology is related to any specific  hepatobiliary problem. Also, I do not think she is having significant acid reflux with continued pain on twice a day Nexium therapy. Obviously,  chronic anxiety and depression exacerbates her symptomatology.  Problem # 2:  IBS (ICD-564.1) Assessment: Unchanged Recommendations for possible acupuncture therapy has been suggested. The patient is not interested in any diagnostic or therapeutic interventions that caused any out of pocket expense.  Problem # 3:  B12 DEFICIENCY (ICD-266.2) Assessment: Improved Continue monthly parenteral replacement therapy.  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  Problem # 5:  BREAST CANCER, HX OF (ICD-V10.3) Assessment: Unchanged Status Post bilateral mastectomies on Tamoxifen 20 mg a day and follow closely by oncology and Dr. Posey Rea and primary care.  Problem # 6:  FIBROMYALGIA (ICD-729.1) Assessment: Unchanged  Patient Instructions: 1)  A prescription will will be sent to your pharmacy for a GI cocktail to use as needed. 2)  A referral will be made for you to see Dr. Alycia Rossetti.  We will call you with an appt. 3)  The medication list was reviewed and reconciled.  All changed / newly prescribed medications were explained.  A complete medication list was provided to the patient / caregiver. 4)  Copy sent to : Dr.Koch at [facility] in [place], Dr. Sonda Primes, Dr. Cyndia Bent at [facility] surgery, Dr. Fayrene Fearing love in neurology and Dr. Marikay Alar Magrinat in oncology. 5)  Please continue current medications.  Prescriptions: GI COCKTAIL (EQUAL PARTS MAALOX, DONNATAL, XYLOCAINE) 1 tbsp 1 4-6 hrs as needed  #1 pt x 3   Entered by:   Ashok Cordia RN   Authorized by:   Mardella Layman MD [facility]   Signed by:   Ashok Cordia RN on 05/22/2010   Method used:   Faxed to ...       [facility]* (retail)       [address] [place], [place]  [address]       Ph: [phone]       Fax: [phone]   RxID:   [id number]   Appended  Document: yearly check up...em    Clinical Lists Changes  Orders: Added new Test order of [facility]) - Signed

## 2010-12-18 NOTE — Progress Notes (Signed)
Summary: Cipro  Phone Note Other Incoming   Summary of Call: see append from 05-09-10 labs Initial call taken by: Lanier Prude, [facility]),  May 14, 2010 2:26 PM    New/Updated Medications: CIPRO 250 MG TABS (CIPROFLOXACIN HCL) 1 by mouth two times a day Prescriptions: CIPRO 250 MG TABS (CIPROFLOXACIN HCL) 1 by mouth two times a day  #10 x 0   Entered by:   Lanier Prude, CMA(AAMA)   Authorized by:   Tresa Garter MD   Signed by:   Lanier Prude, [facility]) on 05/14/2010   Method used:   Electronically to        [facility]* (retail)       [address] [place], [place]  [address]       Ph: [phone]       Fax: [phone]   RxID:   [id number]

## 2010-12-18 NOTE — Assessment & Plan Note (Signed)
Summary: 2 MO ROV /NWS   Vital Signs:  Patient profile:   65 year old female Weight:      167 pounds BMI:     26.25 Temp:     97.4 degrees F oral Pulse rate:   65 / minute BP sitting:   102 / 64  (left arm)  Vitals Entered By: Tora Perches (December 12, 2009 2:43 PM) CC: f/u Is Patient Diabetic? No   Primary Care Provider:  Sonda Primes, MD  CC:  f/u.  History of Present Illness: The patient presents for a follow up of FMS, back and hip pain, anxiety, depression and depression.   Preventive Screening-Counseling & Management  Alcohol-Tobacco     Smoking Status: never  Current Medications (verified): 1)  Nexium 40 Mg  Cpdr (Esomeprazole Magnesium) .... Two Times A Day 2)  Tamoxifen Citrate 20 Mg  Tabs (Tamoxifen Citrate) .Marland Kitchen.. 1 By Mouth Qd 3)  Gabapentin 600 Mg  Tabs (Gabapentin) .Marland Kitchen.. 1 By Mouth Tid 4)  Diovan 320 Mg Tabs (Valsartan) .Marland Kitchen.. 1 By Mouth Qd 5)  Meloxicam 15 Mg  Tabs (Meloxicam) .Marland Kitchen.. 1 By Mouth Once Daily Pc Prn 6)  Darvocet-N 100 100-650 Mg Tabs (Propoxyphene N-Apap) .Marland Kitchen.. 1 By Mouth Qid As Needed Pain 7)  Ocuvite Adult 50+   Caps (Multiple Vitamins-Minerals) .... Once Daily 8)  Zolpidem Tartrate 10 Mg  Tabs (Zolpidem Tartrate) .... 1/2 or 1 By Mouth At [facility] Prn 9)  Vitamin D3 1000 Unit  Tabs (Cholecalciferol) .Marland Kitchen.. 1 Qd 10)  Carafate 1 Gm Tabs (Sucralfate) .Marland Kitchen.. 1 By Mouth Qid 11)  Alprazolam 0.25 Mg Tabs (Alprazolam) .Marland Kitchen.. 1 By Mouth Bid 12)  Cobal-1000 1000 Mcg/ml Soln (Cyanocobalamin) .Marland Kitchen.. 1 Ml Every 2 Wks To 1 Mth 13)  Fluticasone Propionate 50 Mcg/act  Susp (Fluticasone Propionate) .... 2 Sprays Each Nostril Once Daily 14)  Bd Insulin Syringe Ultrafine 29g X 1/2" 1 Ml Misc (Insulin Syringe-Needle U-100) .... As Dirrected For Vit B12 15)  Loratadine 10 Mg  Tabs (Loratadine) .... Once Daily As Needed Allergies 16)  Valtrex 500 Mg Tabs (Valacyclovir Hcl) .Marland Kitchen.. 1 By Mouth Two Times A Day Prn  Allergies: 1)  ! Sodium Lauryl Sulfate (Sodium Lauryl Sulfate) 2)  !  Lovastatin (Lovastatin) 3)  Cymbalta (Duloxetine Hcl) 4)  Symbyax (Olanzapine-Fluoxetine Hcl)  Past History:  Past Medical History: Last updated: 07/19/2009 Osteoarthritis Chest wall neuralgia, post-op FMS  Current Problems:  HEMORRHOIDS, INTERNAL (ICD-455.0) IBS (ICD-564.1) ESOPHAGEAL STRICTURE (ICD-530.3) HIATAL HERNIA (ICD-553.3) CHEST PAIN, UNSPECIFIED (ICD-786.50) UPPER RESPIRATORY INFECTION (URI) (ICD-465.9) B12 DEFICIENCY (ICD-266.2) FATIGUE (ICD-780.79) OSTEOARTHRITIS (ICD-715.90) CRAMPS,LEG (ICD-729.82) DEPRESSION (ICD-311) BREAST CANCER, HX OF (ICD-V10.3) ANXIETY (ICD-300.00) NEURALGIA (ICD-729.2) SYMPTOM, HEADACHE (ICD-784.0) RESTLESS LEG SYNDROME (ICD-333.94) INSOMNIA (ICD-780.52) FIBROMYALGIA (ICD-729.1) HYPERTENSION (ICD-401.9) Cold sores    Family History: Last updated: 11/24/2008 Mother: lymphoma B MG, CAD Breast cancer: Paternal grandmother Family History of Diabetes: Mother, Brother, sister Family History of Heart Disease: Sister Family History of Kidney Disease:Mother, brother  Social History: Last updated: 11/24/2008 Occupation: Psychologist, 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  The patient denies anorexia, fever, weight loss, weight gain, and headaches.    Physical Exam  General:  Well developed, well nourished, no acute distress. Not sad  Ears:  External ear exam shows no significant lesions or deformities.  Otoscopic examination reveals clear canals, tympanic membranes are intact bilaterally without bulging, retraction, inflammation or  discharge. Hearing is grossly normal bilaterally. Mouth:  Oral mucosa and oropharynx without lesions or exudates.  Teeth in good repair. Neck:  No deformities, masses, or tenderness noted. Lungs:  Normal respiratory effort, chest expands symmetrically. Lungs are clear to auscultation, no crackles or wheezes. Heart:   Normal rate and regular rhythm. S1 and S2 normal without gallop, murmur, click, rub or other extra sounds. Abdomen:  S/NT Msk:  No deformity or scoliosis noted of thoracic or lumbar spine.  Decr ROM LS spine Extremities:  No clubbing, cyanosis, edema, or deformity noted with normal full range of motion of all joints.   Neurologic:  No cranial nerve deficits noted. Station and gait are normal. Plantar reflexes are down-going bilaterally. DTRs are symmetrical throughout. Sensory, motor and coordinative functions appear intact. Skin:  Intact without suspicious lesions or rashes Psych:  Oriented X3, not suicidal, not homicidal, and depressed affect.     Impression & Recommendations:  Problem # 1:  DEPRESSION (ICD-311) Assessment Improved  Her updated medication list for this problem includes:    Alprazolam 0.25 Mg Tabs (Alprazolam) .Marland Kitchen... 1 by mouth bid  Problem # 2:  ANXIETY (ICD-300.00) Assessment: Improved  Her updated medication list for this problem includes:    Alprazolam 0.25 Mg Tabs (Alprazolam) .Marland Kitchen... 1 by mouth bid  Problem # 3:  B12 DEFICIENCY (ICD-266.2) Assessment: Comment Only On prescription therapy   Problem # 4:  FIBROMYALGIA (ICD-729.1) Assessment: Unchanged  The following medications were removed from the medication list:    Darvocet-n 100 100-650 Mg Tabs (Propoxyphene n-apap) .Marland Kitchen... 1 by mouth qid as needed pain Her updated medication list for this problem includes:    Meloxicam 15 Mg Tabs (Meloxicam) .Marland Kitchen... 1 by mouth once daily pc prn  Problem # 5:  INSOMNIA (ICD-780.52) Assessment: Comment Only  Her updated medication list for this problem includes:    Zolpidem Tartrate 10 Mg Tabs (Zolpidem tartrate) .Marland Kitchen... 1/2 or 1 by mouth at hs prn  Complete Medication List: 1)  Nexium 40 Mg Cpdr (Esomeprazole magnesium) .... Two times a day 2)  Tamoxifen Citrate 20 Mg Tabs (Tamoxifen citrate) .Marland Kitchen.. 1 by mouth qd 3)  Gabapentin 600 Mg Tabs (Gabapentin) .Marland Kitchen.. 1 by mouth  tid 4)  Diovan 320 Mg Tabs (Valsartan) .Marland Kitchen.. 1 by mouth qd 5)  Meloxicam 15 Mg Tabs (Meloxicam) .Marland Kitchen.. 1 by mouth once daily pc prn 6)  Ocuvite Adult 50+ Caps (Multiple vitamins-minerals) .... Once daily 7)  Zolpidem Tartrate 10 Mg Tabs (Zolpidem tartrate) .... 1/2 or 1 by mouth at hs prn 8)  Vitamin D3 1000 Unit Tabs (Cholecalciferol) .Marland Kitchen.. 1 qd 9)  Carafate 1 Gm Tabs (Sucralfate) .Marland Kitchen.. 1 by mouth qid 10)  Alprazolam 0.25 Mg Tabs (Alprazolam) .Marland Kitchen.. 1 by mouth bid 11)  Cobal-1000 1000 Mcg/ml Soln (Cyanocobalamin) .Marland Kitchen.. 1 ml every 2 wks to 1 mth 12)  Fluticasone Propionate 50 Mcg/act Susp (Fluticasone propionate) .... 2 sprays each nostril once daily 13)  Bd Insulin Syringe Ultrafine 29g X 1/2" 1 Ml Misc (Insulin syringe-needle u-100) .... As dirrected for vit b12 14)  Loratadine 10 Mg Tabs (Loratadine) .... Once daily as needed allergies 15)  Valtrex 500 Mg Tabs (Valacyclovir hcl) .Marland Kitchen.. 1 by mouth two times a day prn  Patient Instructions: 1)  Please schedule a follow-up appointment in 2 months. 2)  BMP prior to visit, ICD-9: 3)  TSH prior to visit, ICD-9: 4)  Vit B12 266.20 995.20  Prescriptions: ALPRAZOLAM 0.25 MG TABS (ALPRAZOLAM) 1 by mouth bid  #  60 x 3   Entered and Authorized by:   Tresa Garter MD   Signed by:   Tresa Garter MD on 12/12/2009   Method used:   Print then Give to Patient   RxID:   [id number] ZOLPIDEM TARTRATE 10 MG  TABS (ZOLPIDEM TARTRATE) 1/2 or 1 by mouth at hs prn  #90 x 1   Entered and Authorized by:   Tresa Garter MD   Signed by:   Tresa Garter MD on 12/12/2009   Method used:   Print then Give to Patient   RxID:   [id number] NEXIUM 40 MG  CPDR (ESOMEPRAZOLE MAGNESIUM) two times a day  #180 x 3   Entered and Authorized by:   Tresa Garter MD   Signed by:   Tresa Garter MD on 12/12/2009   Method used:   Print then Give to Patient   RxID:   [id number]

## 2010-12-18 NOTE — Letter (Signed)
Summary: Out of Work  [facility]  [address] [place], [place] [address]   Phone: [phone]  Fax: [phone]    December 12, 2009   Employee:  Claudia [name]    To Whom It May Concern:   For Medical reasons, please excuse the above named employee from work for another 6 weeks past 01/18/2010:   If you need additional information, please feel free to contact our office.         Sincerely,    Tresa Garter MD

## 2010-12-18 NOTE — Procedures (Signed)
Summary: pH Study/[facility] Alycia Rossetti MD/[facility]  pH [place] Alycia Rossetti MD/[facility]   Imported By: Lester Waterloo 10/05/2010 08:45:59  _____________________________________________________________________  External Attachment:    Type:   Image     Comment:   External Document

## 2010-12-18 NOTE — Procedures (Signed)
Summary: Manometry/Kenneth Alycia Rossetti MD/[facility]  Manometry/Kenneth Alycia Rossetti MD/[facility]   Imported By: Lester Lacona 10/05/2010 08:44:20  _____________________________________________________________________  External Attachment:    Type:   Image     Comment:   External Document

## 2010-12-18 NOTE — Consult Note (Signed)
Summary: GI/[facility]  GI/[facility]   Imported By: Sherian Rein 07/24/2010 07:26:30  _____________________________________________________________________  External Attachment:    Type:   Image     Comment:   External Document

## 2010-12-18 NOTE — Assessment & Plan Note (Signed)
Summary: 2 mos f/u #/cd   Vital Signs:  Patient profile:   65 year old female Weight:      167 pounds Temp:     98.4 degrees F oral Pulse rate:   76 / minute BP sitting:   124 / 64  (left arm)  Vitals Entered By: Tora Perches (February 06, 2010 3:10 PM) CC: f/u Is Patient Diabetic? No   Primary Care Provider:  Sonda Primes, MD  CC:  f/u.  History of Present Illness: The patient presents for a follow up of back pain, anxiety,  and HTN, GERD. C/o depression. C/o vertigo off and on x wks  Preventive Screening-Counseling & Management  Alcohol-Tobacco     Smoking Status: never  Caffeine-Diet-Exercise     Does Patient Exercise: yes  Current Medications (verified): 1)  Nexium 40 Mg  Cpdr (Esomeprazole Magnesium) .... Two Times A Day 2)  Tamoxifen Citrate 20 Mg  Tabs (Tamoxifen Citrate) .Marland Kitchen.. 1 By Mouth Qd 3)  Gabapentin 600 Mg  Tabs (Gabapentin) .Marland Kitchen.. 1 By Mouth Tid 4)  Diovan 320 Mg Tabs (Valsartan) .Marland Kitchen.. 1 By Mouth Qd 5)  Meloxicam 15 Mg  Tabs (Meloxicam) .Marland Kitchen.. 1 By Mouth Once Daily Pc Prn 6)  Ocuvite Adult 50+   Caps (Multiple Vitamins-Minerals) .... Once Daily 7)  Zolpidem Tartrate 10 Mg  Tabs (Zolpidem Tartrate) .... 1/2 or 1 By Mouth At [facility] Prn 8)  Vitamin D3 1000 Unit  Tabs (Cholecalciferol) .Marland Kitchen.. 1 Qd 9)  Carafate 1 Gm Tabs (Sucralfate) .Marland Kitchen.. 1 By Mouth Qid 10)  Alprazolam 0.25 Mg Tabs (Alprazolam) .Marland Kitchen.. 1 By Mouth Bid 11)  Cobal-1000 1000 Mcg/ml Soln (Cyanocobalamin) .Marland Kitchen.. 1 Ml Every 2 Wks To 1 Mth 12)  Fluticasone Propionate 50 Mcg/act  Susp (Fluticasone Propionate) .... 2 Sprays Each Nostril Once Daily 13)  Bd Insulin Syringe Ultrafine 29g X 1/2" 1 Ml Misc (Insulin Syringe-Needle U-100) .... As Dirrected For Vit B12 14)  Loratadine 10 Mg  Tabs (Loratadine) .... Once Daily As Needed Allergies 15)  Valtrex 500 Mg Tabs (Valacyclovir Hcl) .Marland Kitchen.. 1 By Mouth Two Times A Day Prn  Allergies: 1)  ! Sodium Lauryl Sulfate (Sodium Lauryl Sulfate) 2)  ! Lovastatin (Lovastatin) 3)   Symbyax (Olanzapine-Fluoxetine Hcl) 4)  Wellbutrin Xl (Bupropion Hcl)  Past History:  Past Medical History: Last updated: 07/19/2009 Osteoarthritis Chest wall neuralgia, post-op FMS  Current Problems:  HEMORRHOIDS, INTERNAL (ICD-455.0) IBS (ICD-564.1) ESOPHAGEAL STRICTURE (ICD-530.3) HIATAL HERNIA (ICD-553.3) CHEST PAIN, UNSPECIFIED (ICD-786.50) UPPER RESPIRATORY INFECTION (URI) (ICD-465.9) B12 DEFICIENCY (ICD-266.2) FATIGUE (ICD-780.79) OSTEOARTHRITIS (ICD-715.90) CRAMPS,LEG (ICD-729.82) DEPRESSION (ICD-311) BREAST CANCER, HX OF (ICD-V10.3) ANXIETY (ICD-300.00) NEURALGIA (ICD-729.2) SYMPTOM, HEADACHE (ICD-784.0) RESTLESS LEG SYNDROME (ICD-333.94) INSOMNIA (ICD-780.52) FIBROMYALGIA (ICD-729.1) HYPERTENSION (ICD-401.9) Cold sores    Past Surgical History: Last updated: 02/08/2009 Hysterectomy Tonsillectomy Mastectomy B Cholecystectomy 2010  Family History: Last updated: 11/24/2008 Mother: lymphoma B MG, CAD Breast cancer: Paternal grandmother Family History of Diabetes: Mother, Brother, sister Family History of Heart Disease: Sister Family History of Kidney Disease:Mother, brother  Social History: Occupation: Psychologist, forensic Married Never Smoked Alcohol Use - no Illicit Drug Use - no Regular exercise-yes walking Does Patient Exercise:  yes  Review of Systems       The patient complains of depression.  The patient denies fever, chest pain, abdominal pain, and melena.         Stressed.  Physical Exam  General:  Well developed, well nourished, no acute distress. Not sad  Ears:  External ear exam shows no significant lesions  or deformities.  Otoscopic examination reveals clear canals, tympanic membranes are intact bilaterally without bulging, retraction, inflammation or discharge. Hearing is grossly normal bilaterally. Mouth:  Oral mucosa and oropharynx without lesions or exudates.  Teeth in good repair. Lungs:  Normal respiratory effort, chest expands  symmetrically. Lungs are clear to auscultation, no crackles or wheezes. Heart:  Normal rate and regular rhythm. S1 and S2 normal without gallop, murmur, click, rub or other extra sounds. Abdomen:  S/NT Msk:  No deformity or scoliosis noted of thoracic or lumbar spine.  Decr ROM LS spine Extremities:  No clubbing, cyanosis, edema, or deformity noted with normal full range of motion of all joints.   Neurologic:  No cranial nerve deficits noted. Station and gait are normal. Plantar reflexes are down-going bilaterally. DTRs are symmetrical throughout. Sensory, motor and coordinative functions appear intact. Skin:  Intact without suspicious lesions or rashes Psych:  Oriented X3, not suicidal, not homicidal, and depressed affect.     Impression & Recommendations:  Problem # 1:  FATIGUE (ICD-780.79) Assessment Improved  Problem # 2:  ANXIETY (ICD-300.00) Assessment: Improved  Her updated medication list for this problem includes:    Alprazolam 0.25 Mg Tabs (Alprazolam) .Marland Kitchen... 1 by mouth bid    Fluoxetine Hcl 20 Mg Tabs (Fluoxetine hcl) .Marland Kitchen... 1 by mouth qd The office visit took longer than 45 min with patient councelling for more than 50% of the 45 min  Problem # 3:  DEPRESSION (ICD-311) Assessment: Improved  Her updated medication list for this problem includes:    Alprazolam 0.25 Mg Tabs (Alprazolam) .Marland Kitchen... 1 by mouth bid    Fluoxetine Hcl 20 Mg Tabs (Fluoxetine hcl) .Marland Kitchen... 1 by mouth qd  Problem # 4:  B12 DEFICIENCY (ICD-266.2) Assessment: Comment Only On prescription therapy   Problem # 5:  VERTIGO (ICD-780.4) BPV Assessment: Deteriorated Francee Piccolo - Daroff exercise was given to the patient  Her updated medication list for this problem includes:    Loratadine 10 Mg Tabs (Loratadine) ..... Once daily as needed allergies  Complete Medication List: 1)  Nexium 40 Mg Cpdr (Esomeprazole magnesium) .... Two times a day 2)  Tamoxifen Citrate 20 Mg Tabs (Tamoxifen citrate) .Marland Kitchen.. 1 by mouth  qd 3)  Gabapentin 600 Mg Tabs (Gabapentin) .Marland Kitchen.. 1 by mouth tid 4)  Meloxicam 15 Mg Tabs (Meloxicam) .Marland Kitchen.. 1 by mouth once daily pc prn 5)  Ocuvite Adult 50+ Caps (Multiple vitamins-minerals) .... Once daily 6)  Zolpidem Tartrate 10 Mg Tabs (Zolpidem tartrate) .... 1/2 or 1 by mouth at hs prn 7)  Vitamin D3 1000 Unit Tabs (Cholecalciferol) .Marland Kitchen.. 1 qd 8)  Carafate 1 Gm Tabs (Sucralfate) .Marland Kitchen.. 1 by mouth qid 9)  Alprazolam 0.25 Mg Tabs (Alprazolam) .Marland Kitchen.. 1 by mouth bid 10)  Cobal-1000 1000 Mcg/ml Soln (Cyanocobalamin) .Marland Kitchen.. 1 ml every 2 wks to 1 mth 11)  Fluticasone Propionate 50 Mcg/act Susp (Fluticasone propionate) .... 2 sprays each nostril once daily 12)  Bd Insulin Syringe Ultrafine 29g X 1/2" 1 Ml Misc (Insulin syringe-needle u-100) .... As dirrected for vit b12 13)  Loratadine 10 Mg Tabs (Loratadine) .... Once daily as needed allergies 14)  Valtrex 500 Mg Tabs (Valacyclovir hcl) .Marland Kitchen.. 1 by mouth two times a day prn 15)  Losartan Potassium 100 Mg Tabs (Losartan potassium) .Marland Kitchen.. 1 by mouth once daily for blood pressure 16)  Fluoxetine Hcl 20 Mg Tabs (Fluoxetine hcl) .Marland Kitchen.. 1 by mouth qd 17)  Zithromax Z-pak 250 Mg Tabs (Azithromycin) .... As dirrected  Patient Instructions:  1)  Please schedule a follow-up appointment in 2 months. 2)  Francee Piccolo - Daroff exercise once daily  Prescriptions: ZITHROMAX Z-PAK 250 MG TABS (AZITHROMYCIN) as dirrected  #1 x 0   Entered and Authorized by:   Tresa Garter MD   Signed by:   Tresa Garter MD on 02/06/2010   Method used:   Print then Give to Patient   RxID:   [id number] FLUOXETINE HCL 20 MG TABS (FLUOXETINE HCL) 1 by mouth qd  #90 x 3   Entered and Authorized by:   Tresa Garter MD   Signed by:   Tresa Garter MD on 02/06/2010   Method used:   Print then Give to Patient   RxID:   [id number] LOSARTAN POTASSIUM 100 MG TABS (LOSARTAN POTASSIUM) 1 by mouth once daily for blood pressure  #90 x 3   Entered and Authorized by:    Tresa Garter MD   Signed by:   Tresa Garter MD on 02/06/2010   Method used:   Print then Give to Patient   RxID:   [id number]

## 2010-12-18 NOTE — Progress Notes (Signed)
Summary: PHARM ?  Phone Note From Pharmacy   Caller: [facility] - [phone] Ref # [id number] Summary of Call: There is a drug interaction between tamoxifen and cymbalta. Is MD aware? Ok for patient to fill medication?  Initial call taken by: Lamar Sprinkles, CMA,  September 11, 2010 3:13 PM  Follow-up for Phone Call        Pls call pt - we will have to change either antidepressant or Tamoxifen due to interaction potential. I can change her Cymbalta to smthg else Follow-up by: Tresa Garter MD,  September 12, 2010 2:34 PM  Additional Follow-up for Phone Call Additional follow up Details #1::        left mess to call office back..................Marland KitchenLamar Sprinkles, CMA  September 12, 2010 3:02 PM   Pt called back. She had her daughter to call [facility] Monas - they advised that interaction was the cymbalta causes tamoxifen to leave her system 5% faster. Dr Magrinat's office said there was no problem if cymbalta was working. Ok for pt to continue both meds?  Additional Follow-up by: Lamar Sprinkles, CMA,  September 13, 2010 10:44 AM    Additional Follow-up for Phone Call Additional follow up Details #2::    I'm certainly OK with it.  Follow-up by: Tresa Garter MD,  September 13, 2010 1:23 PM  Additional Follow-up for Phone Call Additional follow up Details #3:: Details for Additional Follow-up Action Taken: Patient & pharm informed.  Additional Follow-up by: Lamar Sprinkles, CMA,  September 13, 2010 4:08 PM

## 2010-12-18 NOTE — Assessment & Plan Note (Signed)
Summary: 3 mos f/u / # / cd   Vital Signs:  Patient profile:   65 year old female Height:      67 inches Weight:      156 pounds O2 Sat:      97 % on Room air Pulse rate:   63 / minute Resp:     16 per minute BP sitting:   118 / 64  (left arm) Cuff size:   regular  Vitals Entered By: Lanier Prude, CMA(AAMA) (May 09, 2010 3:16 PM)  O2 Flow:  Room air CC: f/u, Lipid Management   Primary Care Provider:  Sonda Primes, MD  CC:  f/u and Lipid Management.  History of Present Illness: C/o RUQ pain - lost 10 lbs.  Pain is worse after eating certain things. The patient presents for a follow up of OA, anxiety, depression and hyperactivity  Lipid Management History:      Positive NCEP/ATP III risk factors include female age 65 years old or older, HDL cholesterol less than 40, and hypertension.  Negative NCEP/ATP III risk factors include non-tobacco-user status.    Current Medications (verified): 1)  Nexium 40 Mg  Cpdr (Esomeprazole Magnesium) .... Two Times A Day 2)  Tamoxifen Citrate 20 Mg  Tabs (Tamoxifen Citrate) .Marland Kitchen.. 1 By Mouth Qd 3)  Gabapentin 600 Mg  Tabs (Gabapentin) .Marland Kitchen.. 1 By Mouth Tid 4)  Meloxicam 15 Mg  Tabs (Meloxicam) .Marland Kitchen.. 1 By Mouth Once Daily Pc Prn 5)  Ocuvite Adult 50+   Caps (Multiple Vitamins-Minerals) .... Once Daily 6)  Zolpidem Tartrate 10 Mg  Tabs (Zolpidem Tartrate) .... 1/2 or 1 By Mouth At [facility] Prn 7)  Vitamin D3 1000 Unit  Tabs (Cholecalciferol) .Marland Kitchen.. 1 Qd 8)  Carafate 1 Gm Tabs (Sucralfate) .Marland Kitchen.. 1 By Mouth Qid 9)  Alprazolam 0.25 Mg Tabs (Alprazolam) .Marland Kitchen.. 1 By Mouth Bid 10)  Cobal-1000 1000 Mcg/ml Soln (Cyanocobalamin) .Marland Kitchen.. 1 Ml Every 2 Wks To 1 Mth 11)  Fluticasone Propionate 50 Mcg/act  Susp (Fluticasone Propionate) .... 2 Sprays Each Nostril Once Daily 12)  Bd Insulin Syringe Ultrafine 29g X 1/2" 1 Ml Misc (Insulin Syringe-Needle U-100) .... As Dirrected For Vit B12 13)  Loratadine 10 Mg  Tabs (Loratadine) .... Once Daily As Needed Allergies 14)   Valtrex 500 Mg Tabs (Valacyclovir Hcl) .Marland Kitchen.. 1 By Mouth Two Times A Day Prn 15)  Losartan Potassium 100 Mg Tabs (Losartan Potassium) .Marland Kitchen.. 1 By Mouth Once Daily For Blood Pressure 16)  Fluoxetine Hcl 20 Mg Tabs (Fluoxetine Hcl) .Marland Kitchen.. 1 By Mouth Qd 17)  Clidinium-Chlordiazepoxide 2.5-5 Mg Caps (Clidinium-Chlordiazepoxide) .Marland Kitchen.. 1 By Mouth Tid  As Needed  Allergies (verified): 1)  ! Sodium Lauryl Sulfate (Sodium Lauryl Sulfate) 2)  ! Lovastatin (Lovastatin) 3)  Symbyax (Olanzapine-Fluoxetine Hcl) 4)  Wellbutrin Xl (Bupropion Hcl)  Past History:  Past Medical History: Last updated: 07/19/2009 Osteoarthritis Chest wall neuralgia, post-op FMS  Current Problems:  HEMORRHOIDS, INTERNAL (ICD-455.0) IBS (ICD-564.1) ESOPHAGEAL STRICTURE (ICD-530.3) HIATAL HERNIA (ICD-553.3) CHEST PAIN, UNSPECIFIED (ICD-786.50) UPPER RESPIRATORY INFECTION (URI) (ICD-465.9) B12 DEFICIENCY (ICD-266.2) FATIGUE (ICD-780.79) OSTEOARTHRITIS (ICD-715.90) CRAMPS,LEG (ICD-729.82) DEPRESSION (ICD-311) BREAST CANCER, HX OF (ICD-V10.3) ANXIETY (ICD-300.00) NEURALGIA (ICD-729.2) SYMPTOM, HEADACHE (ICD-784.0) RESTLESS LEG SYNDROME (ICD-333.94) INSOMNIA (ICD-780.52) FIBROMYALGIA (ICD-729.1) HYPERTENSION (ICD-401.9) Cold sores    Past Surgical History: Last updated: 02/08/2009 Hysterectomy Tonsillectomy Mastectomy B Cholecystectomy 2010  Social History: Last updated: 05/09/2010 Occupation: Psychologist, forensic - retired again 2011 Married Never Smoked Alcohol Use - no Illicit Drug Use - no Regular exercise-yes walking  Social History: Occupation: Psychologist, 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  The patient denies anorexia, fever, vision loss, syncope, prolonged cough, hemoptysis, melena, and severe indigestion/heartburn.          anxious  Physical Exam  General:  Well developed, well nourished, no acute distress. Not sad  Eyes:  No corneal or conjunctival inflammation noted. EOMI. Perrla. Funduscopic exam benign, without hemorrhages, exudates or papilledema. Vision grossly normal. Nose:  External nasal examination shows no deformity or inflammation. Nasal mucosa are pink and moist without lesions or exudates. Mouth:  Oral mucosa and oropharynx without lesions or exudates.  Teeth in good repair. Neck:  No deformities, masses, or tenderness noted. Lungs:  Normal respiratory effort, chest expands symmetrically. Lungs are clear to auscultation, no crackles or wheezes. Heart:  Normal rate and regular rhythm. S1 and S2 normal without gallop, murmur, click, rub or other extra sounds. Abdomen:  S/NT Msk:  No deformity or scoliosis noted of thoracic or lumbar spine.  Decr ROM LS spine Extremities:  No clubbing, cyanosis, edema, or deformity noted with normal full range of motion of all joints.   Neurologic:  No cranial nerve deficits noted. Station and gait are normal. Plantar reflexes are down-going bilaterally. DTRs are symmetrical throughout. Sensory, motor and coordinative functions appear intact. Skin:  Intact without suspicious lesions or rashes Psych:  Oriented X3, not suicidal, not homicidal, and depressed affect.  labile affect, tearful, and slightly anxious.     Impression & Recommendations:  Problem # 1:  WEIGHT LOSS (ICD-783.21) unclear etiol Assessment New Will watch Orders: TLB-BMP (Basic Metabolic Panel-BMET) (80048-METABOL) TLB-B12, Serum-Total ONLY (14782-N56) TLB-TSH (Thyroid Stimulating Hormone) (84443-TSH)  Problem # 2:  ABDOMINAL PAIN, EPIGASTRIC (ICD-789.06) - poss gastritis Assessment: New Hold Meloxicam  Problem # 3:  B12 DEFICIENCY (ICD-266.2) Assessment: Comment Only  Orders: TLB-B12, Serum-Total ONLY (21308-M57)  Problem # 4:  NEURALGIA (ICD-729.2) chest Assessment: Unchanged On  prescription drug  therapy   Problem # 5:  FREQUENCY, URINARY (ICD-788.41) Assessment: Unchanged  Orders: TLB-Udip ONLY (81003-UDIP) TLB-BMP (Basic Metabolic Panel-BMET) (80048-METABOL) TLB-B12, Serum-Total ONLY (84696-E95) TLB-TSH (Thyroid Stimulating Hormone) (84443-TSH)  Her updated medication list for this problem includes:    Gelnique 10 % Gel (Oxybutynin chloride) ..... Use on skin once daily for bladder issues  Problem # 6:  DEPRESSION (ICD-311) Assessment: Unchanged The office visit took longer than 45 min with patient councelling for more than 50% of the 45 min  Her updated medication list for this problem includes:    Alprazolam 0.25 Mg Tabs (Alprazolam) .Marland Kitchen... 1 by mouth bid    Fluoxetine Hcl 20 Mg Tabs (Fluoxetine hcl) .Marland Kitchen... 1 by mouth qd  Complete Medication List: 1)  Nexium 40 Mg Cpdr (Esomeprazole magnesium) .... Two times a day 2)  Tamoxifen Citrate 20 Mg Tabs (Tamoxifen citrate) .Marland Kitchen.. 1 by mouth qd 3)  Gabapentin 600 Mg Tabs (Gabapentin) .Marland Kitchen.. 1 by mouth tid 4)  Meloxicam 15 Mg Tabs (Meloxicam) .Marland Kitchen.. 1 by mouth once daily pc prn 5)  Ocuvite Adult 50+ Caps (Multiple vitamins-minerals) .... Once daily 6)  Zolpidem Tartrate 10 Mg Tabs (Zolpidem tartrate) .... 1/2 or 1 by mouth at hs prn 7)  Vitamin D3 1000 Unit Tabs (Cholecalciferol) .Marland Kitchen.. 1 qd 8)  Carafate 1 Gm Tabs (Sucralfate) .Marland Kitchen.. 1 by mouth qid 9)  Alprazolam 0.25 Mg Tabs (Alprazolam) .Marland Kitchen.. 1 by mouth bid 10)  Cobal-1000 1000 Mcg/ml Soln (Cyanocobalamin) .Marland Kitchen.. 1 ml every 2 wks to 1 mth 11)  Fluticasone Propionate 50 Mcg/act Susp (Fluticasone propionate) .... 2 sprays each nostril once daily 12)  Bd Insulin Syringe Ultrafine 29g X 1/2" 1 Ml Misc (Insulin syringe-needle u-100) .... As dirrected for vit b12 13)  Loratadine 10 Mg Tabs (Loratadine) .... Once daily as needed allergies 14)  Valtrex 500 Mg Tabs (Valacyclovir hcl) .Marland Kitchen.. 1 by mouth two times a day prn 15)  Losartan Potassium 100 Mg Tabs (Losartan potassium) .Marland Kitchen..  1 by mouth once daily for blood pressure 16)  Fluoxetine Hcl 20 Mg Tabs (Fluoxetine hcl) .Marland Kitchen.. 1 by mouth qd 17)  Clidinium-chlordiazepoxide 2.5-5 Mg Caps (Clidinium-chlordiazepoxide) .Marland Kitchen.. 1 by mouth tid  as needed 18)  Gelnique 10 % Gel (Oxybutynin chloride) .... Use on skin once daily for bladder issues 19)  Clidinium-chlordiazepoxide 2.5-5 Mg Caps (Clidinium-chlordiazepoxide) .Marland Kitchen.. 1 by mouth up to  qid as needed for spasms  Lipid Assessment/Plan:      Based on NCEP/ATP III, the patient's risk factor category is "2 or more risk factors and a calculated 10 year CAD risk of < 20%".  The patient's lipid goals are as follows: Total cholesterol goal is 200; LDL cholesterol goal is 130; HDL cholesterol goal is 40; Triglyceride goal is 150.     Patient Instructions: 1)  Please schedule a follow-up appointment in 3 months. Prescriptions: CIPROFLOXACIN HCL 250 MG TABS (CIPROFLOXACIN HCL) 1 by mouth two times a day for cystitis  #10 x 0   Entered by:   Lanier Prude, CMA(AAMA)   Authorized by:   Tresa Garter MD   Signed by:   Lanier Prude, [facility]) on 05/10/2010   Method used:   Electronically to        [facility]* (retail)       [address] [place], [place]  [address]       Ph: [phone]       Fax: [phone]   RxID:   [id number] CLIDINIUM-CHLORDIAZEPOXIDE 2.5-5 MG CAPS (CLIDINIUM-CHLORDIAZEPOXIDE) 1 by mouth up to  qid as needed for spasms  #90 x 3   Entered and Authorized by:   Tresa Garter MD   Signed by:   Tresa Garter MD on 05/09/2010   Method used:   Print then Give to Patient   RxID:   [id number] GELNIQUE 10 % GEL (OXYBUTYNIN CHLORIDE) use on skin once daily for bladder issues  #30 x 3   Entered and Authorized by:   Tresa Garter MD   Signed by:   Tresa Garter MD on 05/09/2010   Method used:   Print then Give to Patient   RxID:   [id number] YTKZSWFU POTASSIUM 100 MG TABS (LOSARTAN  POTASSIUM) 1 by mouth once daily for blood pressure  #90 x 3   Entered and Authorized by:   Tresa Garter MD   Signed by:   Tresa Garter MD on 05/09/2010   Method used:   Print then Give to Patient   RxID:   [id number] MELOXICAM 15 MG  TABS (MELOXICAM) 1 by mouth once daily pc prn  #90 Tablet x 2   Entered and Authorized by:   Tresa Garter MD   Signed by:   Tresa Garter MD on 05/09/2010   Method used:  Print then Give to Patient   RxID:   [id number]

## 2010-12-18 NOTE — Assessment & Plan Note (Signed)
Summary: BLOODY DISCHARGE/ UTI? /VAGINAL? /NWS  #   Vital Signs:  Patient profile:   65 year old female Height:      67 inches Weight:      155 pounds BMI:     24.36 O2 Sat:      97 % on Room air Temp:     98.5 degrees F oral Pulse rate:   60 / minute Pulse rhythm:   regular Resp:     16 per minute BP sitting:   120 / 78  (left arm) Cuff size:   regular  Vitals Entered By: Lanier Prude, CMA(AAMA) (June 13, 2010 9:38 AM)  O2 Flow:  Room air CC: RLQ abd pain/pelvic pain/ bleeding x 3 days Comments pt is not taking gen Claudia [name] or her GI cocktail   Primary Care Provider:  Sonda Primes, MD  CC:  RLQ abd pain/pelvic pain/ bleeding x 3 days.  History of Present Illness: The patient presents for a follow up of back pain, anxiety, depression and headaches. C/o urinary problems  Current Medications (verified): 1)  Nexium 40 Mg  Cpdr (Esomeprazole Magnesium) .... Two Times A Day 2)  Tamoxifen Citrate 20 Mg  Tabs (Tamoxifen Citrate) .Marland Kitchen.. 1 By Mouth Qd 3)  Gabapentin 600 Mg  Tabs (Gabapentin) .Marland Kitchen.. 1 By Mouth Tid 4)  Meloxicam 15 Mg  Tabs (Meloxicam) .Marland Kitchen.. 1 By Mouth Once Daily Pc Prn 5)  Ocuvite Adult 50+   Caps (Multiple Vitamins-Minerals) .... Once Daily 6)  Zolpidem Tartrate 10 Mg  Tabs (Zolpidem Tartrate) .... 1/2 or 1 By Mouth At [facility] Prn 7)  Vitamin D3 1000 Unit  Tabs (Cholecalciferol) .Marland Kitchen.. 1 Qd 8)  Carafate 1 Gm Tabs (Sucralfate) .Marland Kitchen.. 1 By Mouth Qid  As Needed 9)  Alprazolam 0.25 Mg Tabs (Alprazolam) .Marland Kitchen.. 1 By Mouth Two Times A Day As Needed 10)  Cobal-1000 1000 Mcg/ml Soln (Cyanocobalamin) .Marland Kitchen.. 1 Ml Every 2 Wks To 1 Mth 11)  Bd Insulin Syringe Ultrafine 29g X 1/2" 1 Ml Misc (Insulin Syringe-Needle U-100) .... As Dirrected For Vit B12 12)  Loratadine 10 Mg  Tabs (Loratadine) .... Once Daily As Needed Allergies 13)  Valtrex 500 Mg Tabs (Valacyclovir Hcl) .Marland Kitchen.. 1 By Mouth Two Times A Day Prn 14)  Losartan Potassium 100 Mg Tabs (Losartan Potassium) .Marland Kitchen.. 1 By Mouth Once Daily For  Blood Pressure 15)  Fluoxetine Hcl 20 Mg Tabs (Fluoxetine Hcl) .Marland Kitchen.. 1 By Mouth Qd 16)  Clidinium-Chlordiazepoxide 2.5-5 Mg Caps (Clidinium-Chlordiazepoxide) .Marland Kitchen.. 1 By Mouth Tid  As Needed 17)  Gelnique 10 % Gel (Oxybutynin Chloride) .... Use On Skin Once Daily For Bladder Issues 18)  Gi Cocktail (Equal Parts Maalox, Donnatal, Xylocaine) .Marland Kitchen.. 1 Tbsp 1 4-6 Hrs As Needed  Allergies (verified): 1)  ! Sodium Lauryl Sulfate (Sodium Lauryl Sulfate) 2)  ! Lovastatin (Lovastatin) 3)  ! Sulfa 4)  Symbyax (Olanzapine-Fluoxetine Hcl) 5)  Wellbutrin Xl (Bupropion Hcl)  Past History:  Past Medical History: Last updated: 07/19/2009 Osteoarthritis Chest wall neuralgia, post-op FMS  Current Problems:  HEMORRHOIDS, INTERNAL (ICD-455.0) IBS (ICD-564.1) ESOPHAGEAL STRICTURE (ICD-530.3) HIATAL HERNIA (ICD-553.3) CHEST PAIN, UNSPECIFIED (ICD-786.50) UPPER RESPIRATORY INFECTION (URI) (ICD-465.9) B12 DEFICIENCY (ICD-266.2) FATIGUE (ICD-780.79) OSTEOARTHRITIS (ICD-715.90) CRAMPS,LEG (ICD-729.82) DEPRESSION (ICD-311) BREAST CANCER, HX OF (ICD-V10.3) ANXIETY (ICD-300.00) NEURALGIA (ICD-729.2) SYMPTOM, HEADACHE (ICD-784.0) RESTLESS LEG SYNDROME (ICD-333.94) INSOMNIA (ICD-780.52) FIBROMYALGIA (ICD-729.1) HYPERTENSION (ICD-401.9) Cold sores    Past Surgical History: Last updated: 05/22/2010 Hysterectomy Tonsillectomy Mastectomy B  total Cholecystectomy 2010  Social History: Last updated: 05/09/2010 Occupation: Psychologist, 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  The patient denies weight loss, weight gain, dyspnea on exertion, melena, and hematochezia.    Physical Exam  General:  Well developed, well nourished, no acute distress. Not sad  Eyes:  No corneal or conjunctival inflammation noted. EOMI. Perrla.  Nose:  External nasal examination  shows no deformity or inflammation. Nasal mucosa are pink and moist without lesions or exudates. Mouth:  Oral mucosa and oropharynx without lesions or exudates.  Teeth in good repair. Neck:  No deformities, masses, or tenderness noted. Lungs:  Normal respiratory effort, chest expands symmetrically. Lungs are clear to auscultation, no crackles or wheezes. Heart:  Normal rate and regular rhythm. S1 and S2 normal without gallop, murmur, click, rub or other extra sounds. Abdomen:  S/NT Msk:  No deformity or scoliosis noted of thoracic or lumbar spine.  Decr ROM LS spine Extremities:  No clubbing, cyanosis, edema, or deformity noted with normal full range of motion of all joints.   Neurologic:  No cranial nerve deficits noted. Station and gait are normal. Plantar reflexes are down-going bilaterally. DTRs are symmetrical throughout. Sensory, motor and coordinative functions appear intact. Psych:  Oriented X3, not suicidal, not homicidal, and depressed affect.  labile affect, tearful, and slightly anxious.     Impression & Recommendations:  Problem # 1:  FREQUENCY, URINARY (ICD-788.41) Assessment Unchanged  Her updated medication list for this problem includes:    Gelnique 10 % Gel (Oxybutynin chloride) ..... Use on skin once daily for bladder issues  Orders: TLB-Udip ONLY (81003-UDIP)  Problem # 2:  ABDOMINAL PAIN, EPIGASTRIC (ICD-789.06) Assessment: Deteriorated Dr Jarold Motto is [facility]  Problem # 3:  WEIGHT LOSS (ICD-783.21) Assessment: Unchanged  Problem # 4:  B12 DEFICIENCY (ICD-266.2) Assessment: Comment Only On prescription drug  therapy   Problem # 5:  DEPRESSION (ICD-311) Assessment: Unchanged  Her updated medication list for this problem includes:    Alprazolam 0.25 Mg Tabs (Alprazolam) .Marland Kitchen... 1 by mouth two times a day as needed    Fluoxetine Hcl 20 Mg Tabs (Fluoxetine hcl) .Marland Kitchen... 1 by mouth qd  Problem # 6:  BREAST CANCER, HX OF (ICD-V10.3) Assessment: Comment  Only  Problem # 7:  HYPERTENSION (ICD-401.9) Assessment: Improved  Her updated medication list for this problem includes:    Losartan Potassium 100 Mg Tabs (Losartan potassium) .Marland Kitchen... 1 by mouth once daily for blood pressure  Complete Medication List: 1)  Nexium 40 Mg Cpdr (Esomeprazole magnesium) .... Two times a day 2)  Tamoxifen Citrate 20 Mg Tabs (Tamoxifen citrate) .Marland Kitchen.. 1 by mouth qd 3)  Gabapentin 600 Mg Tabs (Gabapentin) .Marland Kitchen.. 1 by mouth tid 4)  Meloxicam 15 Mg Tabs (Meloxicam) .Marland Kitchen.. 1 by mouth once daily pc prn 5)  Ocuvite Adult 50+ Caps (Multiple vitamins-minerals) .... Once daily 6)  Zolpidem Tartrate 10 Mg Tabs (Zolpidem tartrate) .... 1/2 or 1 by mouth at hs prn 7)  Vitamin D3 1000 Unit Tabs (Cholecalciferol) .Marland Kitchen.. 1 qd 8)  Carafate 1 Gm Tabs (Sucralfate) .Marland Kitchen.. 1 by mouth qid  as needed 9)  Alprazolam 0.25 Mg Tabs (Alprazolam) .Marland Kitchen.. 1 by mouth two times a day as needed 10)  Cobal-1000 1000 Mcg/ml Soln (Cyanocobalamin) .Marland Kitchen.. 1 ml every 2 wks to 1 mth 11)  Bd Insulin Syringe Ultrafine 29g X 1/2" 1 Ml Misc (Insulin syringe-needle  u-100) .... As dirrected for vit b12 12)  Loratadine 10 Mg Tabs (Loratadine) .... Once daily as needed allergies 13)  Valtrex 500 Mg Tabs (Valacyclovir hcl) .Marland Kitchen.. 1 by mouth two times a day prn 14)  Losartan Potassium 100 Mg Tabs (Losartan potassium) .Marland Kitchen.. 1 by mouth once daily for blood pressure 15)  Fluoxetine Hcl 20 Mg Tabs (Fluoxetine hcl) .Marland Kitchen.. 1 by mouth qd 16)  Clidinium-chlordiazepoxide 2.5-5 Mg Caps (Clidinium-chlordiazepoxide) .Marland Kitchen.. 1 by mouth tid  as needed 17)  Gelnique 10 % Gel (Oxybutynin chloride) .... Use on skin once daily for bladder issues 18)  Gi Cocktail (equal Parts Maalox, Donnatal, Xylocaine)  .Marland Kitchen.. 1 tbsp 1 4-6 hrs as needed  Patient Instructions: 1)  Please schedule a follow-up appointment in 3 months well w/labs v70.0 and vit B12 266.20. Prescriptions: VALTREX 500 MG TABS (VALACYCLOVIR HCL) 1 by mouth two times a day prn  #56 x 2    Entered and Authorized by:   Tresa Garter MD   Signed by:   Tresa Garter MD on 06/13/2010   Method used:   Print then Give to Patient   RxID:   [id number] ZOLPIDEM TARTRATE 10 MG  TABS (ZOLPIDEM TARTRATE) 1/2 or 1 by mouth at hs prn  #90 x 1   Entered and Authorized by:   Tresa Garter MD   Signed by:   Tresa Garter MD on 06/13/2010   Method used:   Print then Give to Patient   RxID:   [id number] TAMOXIFEN CITRATE 20 MG  TABS (TAMOXIFEN CITRATE) 1 by mouth qd  #90 x 3   Entered and Authorized by:   Tresa Garter MD   Signed by:   Tresa Garter MD on 06/13/2010   Method used:   Print then Give to Patient   RxID:   [id number]

## 2010-12-18 NOTE — Assessment & Plan Note (Signed)
Summary: 6 wk f/u / # / cd   Vital Signs:  Patient profile:   65 year old female Height:      67 inches Weight:      156 pounds BMI:     24.52 Temp:     97.8 degrees F oral Pulse rate:   84 / minute Pulse rhythm:   regular Resp:     16 per minute BP sitting:   112 / 80  (left arm) Cuff size:   regular  Vitals Entered By: Lanier Prude, CMA(AAMA) (August 27, 2010 10:09 AM) CC: 6 wk f/u Is Patient Diabetic? No Comments pt states Ambien is no longer working.  She is not taking carafate, alprazolam, valtrex, clindinium-chlor, Seroquel or the GI cocktail.   Primary Care Provider:  Sonda Primes, MD  CC:  6 wk f/u.  History of Present Illness: The patient presents for a follow up of  anxiety, depression - better  C/o blood on paper after she would wipe after urinating   Preventive Screening-Counseling & Management  Alcohol-Tobacco     Smoking Status: never  Current Medications (verified): 1)  Nexium 40 Mg  Cpdr (Esomeprazole Magnesium) .... Two Times A Day 2)  Tamoxifen Citrate 20 Mg  Tabs (Tamoxifen Citrate) .Marland Kitchen.. 1 By Mouth Qd 3)  Gabapentin 600 Mg  Tabs (Gabapentin) .Marland Kitchen.. 1 By Mouth Tid 4)  Meloxicam 15 Mg  Tabs (Meloxicam) .Marland Kitchen.. 1 By Mouth Once Daily Pc Prn 5)  Ocuvite Adult 50+   Caps (Multiple Vitamins-Minerals) .... Once Daily 6)  Zolpidem Tartrate 10 Mg  Tabs (Zolpidem Tartrate) .... 1/2 or 1 By Mouth At [facility] Prn 7)  Vitamin D3 1000 Unit  Tabs (Cholecalciferol) .Marland Kitchen.. 1 Qd 8)  Carafate 1 Gm Tabs (Sucralfate) .Marland Kitchen.. 1 By Mouth Qid  As Needed 9)  Alprazolam 0.25 Mg Tabs (Alprazolam) .Marland Kitchen.. 1 By Mouth Two Times A Day As Needed 10)  Cobal-1000 1000 Mcg/ml Soln (Cyanocobalamin) .Marland Kitchen.. 1 Ml Every 2 Wks To 1 Mth 11)  Bd Insulin Syringe Ultrafine 29g X 1/2" 1 Ml Misc (Insulin Syringe-Needle U-100) .... As Dirrected For Vit B12 12)  Loratadine 10 Mg  Tabs (Loratadine) .... Once Daily As Needed Allergies 13)  Valtrex 500 Mg Tabs (Valacyclovir Hcl) .Marland Kitchen.. 1 By Mouth Two Times A Day  Prn 14)  Losartan Potassium 100 Mg Tabs (Losartan Potassium) .Marland Kitchen.. 1 By Mouth Once Daily For Blood Pressure 15)  Clidinium-Chlordiazepoxide 2.5-5 Mg Caps (Clidinium-Chlordiazepoxide) .Marland Kitchen.. 1 By Mouth Tid  As Needed 16)  Gelnique 10 % Gel (Oxybutynin Chloride) .... Use On Skin Once Daily For Bladder Issues 17)  Cymbalta 60 Mg Cpep (Duloxetine Hcl) .Marland Kitchen.. 1 By Mouth Once Daily For Depression 18)  Gi Cocktail (Equal Parts Maalox, Donnatal, Xylocaine) .Marland Kitchen.. 1 Tbsp 1 4-6 Hrs As Needed 19)  Seroquel Xr 50 Mg Xr24h-Tab (Quetiapine Fumarate) .Marland Kitchen.. 1 By Mouth Qhs  Allergies (verified): 1)  ! Sodium Lauryl Sulfate (Sodium Lauryl Sulfate) 2)  ! Lovastatin (Lovastatin) 3)  ! Sulfa 4)  Symbyax (Olanzapine-Fluoxetine Hcl) 5)  Wellbutrin Xl (Bupropion Hcl)  Past History:  Past Medical History: Last updated: 07/19/2009 Osteoarthritis Chest wall neuralgia, post-op FMS  Current Problems:  HEMORRHOIDS, INTERNAL (ICD-455.0) IBS (ICD-564.1) ESOPHAGEAL STRICTURE (ICD-530.3) HIATAL HERNIA (ICD-553.3) CHEST PAIN, UNSPECIFIED (ICD-786.50) UPPER RESPIRATORY INFECTION (URI) (ICD-465.9) B12 DEFICIENCY (ICD-266.2) FATIGUE (ICD-780.79) OSTEOARTHRITIS (ICD-715.90) CRAMPS,LEG (ICD-729.82) DEPRESSION (ICD-311) BREAST CANCER, HX OF (ICD-V10.3) ANXIETY (ICD-300.00) NEURALGIA (ICD-729.2) SYMPTOM, HEADACHE (ICD-784.0) RESTLESS LEG SYNDROME (ICD-333.94) INSOMNIA (ICD-780.52) FIBROMYALGIA (ICD-729.1) HYPERTENSION (ICD-401.9) Cold sores  Past Surgical History: Last updated: 05/22/2010 Hysterectomy Tonsillectomy Mastectomy B  total Cholecystectomy 2010  Family History: Last updated: 11/24/2008 Mother: lymphoma B MG, CAD Breast cancer: Paternal grandmother Family History of Diabetes: Mother, Brother, sister Family History of Heart Disease: Sister Family History of Kidney Disease:Mother, brother  Social History: Last updated: 05/09/2010 Occupation: Psychologist, 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    Physical Exam  General:  Well developed, well nourished, no acute distress. Not sad  Nose:  External nasal examination shows no deformity or inflammation. Nasal mucosa are pink and moist without lesions or exudates. Mouth:  Oral mucosa and oropharynx without lesions or exudates.  Teeth in good repair. Neck:  No deformities, masses, or tenderness noted. Lungs:  Normal respiratory effort, chest expands symmetrically. Lungs are clear to auscultation, no crackles or wheezes. Heart:  Normal rate and regular rhythm. S1 and S2 normal without gallop, murmur, click, rub or other extra sounds. Abdomen:  S/NT RLQ sensitive to palpation Msk:  No deformity or scoliosis noted of thoracic or lumbar spine.  Decr ROM LS spine Neurologic:  No cranial nerve deficits noted. Station and gait are normal. Plantar reflexes are down-going bilaterally. DTRs are symmetrical throughout. Sensory, motor and coordinative functions appear intact. Skin:  Intact without suspicious lesions or rashes Psych:  Oriented X3, not suicidal, not homicidal, and depressed affect.  labile affect, tearful, and slightly anxious.  Pressured speech as usual   Impression & Recommendations:  Problem # 1:  HEMATURIA UNSPECIFIED (ICD-599.70) Assessment New  Orders: Urology Referral (Urology) Dr Annabell Howells TLB-Udip ONLY (81003-UDIP)   TRANSABDOMINAL AND TRANSVAGINAL ULTRASOUND OF PELVIS   Technique:  Both transabdominal and transvaginal ultrasound examinations of the pelvis were performed including evaluation of the uterus, ovaries, adnexal regions, and pelvic cul-de-sac. Transabdominal technique was performed for global imaging of the pelvis.  Transvaginal technique was performed for detailed evaluation of the endometrium and/or ovaries.   Comparison:  08/16/2004   Findings:   Uterus the  uterus has been surgically removed.  A normal vaginal cuff is noted.   Endometrium not applicable   Right Ovary not visualized either transabdominally or endovaginally   Left Ovary not visualized either transabdominally or endovaginally   Other Findings:  No pelvic fluid or adnexal masses are seen.   IMPRESSION: Normal posthysterectomy vaginal cuff.  Non-visualized ovaries.  Her updated medication list for this problem includes:    Ciprofloxacin Hcl 250 Mg Tabs (Ciprofloxacin hcl) .Marland Kitchen... 1 by mouth two times a day for cystitis  Problem # 2:  FREQUENCY, URINARY (ICD-788.41) Assessment: Unchanged  The following medications were removed from the medication list:    Gelnique 10 % Gel (Oxybutynin chloride) ..... Use on skin once daily for bladder issues  Problem # 3:  B12 DEFICIENCY (ICD-266.2) Assessment: Unchanged On the regimen of medicine(s) reflected in the chart    Problem # 4:  DEPRESSION (ICD-311) Assessment: Improved  Her updated medication list for this problem includes:    Alprazolam 0.25 Mg Tabs (Alprazolam) .Marland Kitchen... 1 by mouth two times a day as needed    Cymbalta 60 Mg Cpep (Duloxetine hcl) .Marland Kitchen... 1 by mouth once daily for depression  Problem # 5:  OSTEOARTHRITIS (ICD-715.90) Assessment: Unchanged See "Patient Instructions".  Her updated medication list for this problem includes:    Meloxicam 15 Mg Tabs (Meloxicam) .Marland Kitchen... 1 by mouth once  daily pc prn  Complete Medication List: 1)  Nexium 40 Mg Cpdr (Esomeprazole magnesium) .... Two times a day 2)  Tamoxifen Citrate 20 Mg Tabs (Tamoxifen citrate) .Marland Kitchen.. 1 by mouth qd 3)  Gabapentin 600 Mg Tabs (Gabapentin) .Marland Kitchen.. 1 by mouth tid 4)  Meloxicam 15 Mg Tabs (Meloxicam) .Marland Kitchen.. 1 by mouth once daily pc prn 5)  Ocuvite Adult 50+ Caps (Multiple vitamins-minerals) .... Once daily 6)  Zolpidem Tartrate 10 Mg Tabs (Zolpidem tartrate) .... 1/2 or 1 by mouth at hs prn 7)  Alprazolam 0.25 Mg Tabs (Alprazolam) .Marland Kitchen.. 1 by mouth two times a  day as needed 8)  Cobal-1000 1000 Mcg/ml Soln (Cyanocobalamin) .Marland Kitchen.. 1 ml every 2 wks to 1 mth 9)  Bd Insulin Syringe Ultrafine 29g X 1/2" 1 Ml Misc (Insulin syringe-needle u-100) .... As dirrected for vit b12 10)  Loratadine 10 Mg Tabs (Loratadine) .... Once daily as needed allergies 11)  Losartan Potassium 100 Mg Tabs (Losartan potassium) .Marland Kitchen.. 1 by mouth once daily for blood pressure 12)  Cymbalta 60 Mg Cpep (Duloxetine hcl) .Marland Kitchen.. 1 by mouth once daily for depression 13)  Vitamin D 1000 Unit Tabs (Cholecalciferol) .... 2 by mouth once daily 14)  Ciprofloxacin Hcl 250 Mg Tabs (Ciprofloxacin hcl) .Marland Kitchen.. 1 by mouth two times a day for cystitis  Patient Instructions: 1)  Start taking a chair yoga class  2)  Please schedule a follow-up appointment in 3 months well w/labs and vit b12 266.20. Prescriptions: CIPROFLOXACIN HCL 250 MG TABS (CIPROFLOXACIN HCL) 1 by mouth two times a day for cystitis  #10 x 0   Entered and Authorized by:   Tresa Garter MD   Signed by:   Tresa Garter MD on 08/27/2010   Method used:   Electronically to        [facility]* (retail)       [address] [place], [place]  [address]       Ph: [phone]       Fax: [phone]   RxID:   [id number]

## 2010-12-18 NOTE — Letter (Signed)
[facility]   Imported By: Sherian Rein 01/11/2010 12:09:06  _____________________________________________________________________  External Attachment:    Type:   Image     Comment:   External Document

## 2010-12-18 NOTE — Progress Notes (Signed)
  Phone Note Other Incoming   Request: Send information Summary of Call: Records received from [facility]. 4 pages forwarded to Dr. Posey Rea for review.

## 2010-12-18 NOTE — Progress Notes (Signed)
Summary: u/a  Phone Note Call from Patient Call back at Home Phone [phone] Call back at [phone]   Summary of Call: Patient is requesting results of u/a.  Initial call taken by: Lamar Sprinkles, CMA,  June 14, 2010 3:43 PM  Follow-up for Phone Call        Normal UA Follow-up by: Tresa Garter MD,  June 15, 2010 1:14 PM  Additional Follow-up for Phone Call Additional follow up Details #1::        Pt informed  Additional Follow-up by: Lamar Sprinkles, CMA,  June 15, 2010 1:51 PM

## 2010-12-18 NOTE — Progress Notes (Signed)
Summary: Requesting a refill  Phone Note Call from Patient Call back at Home Phone [phone]   Call For: Dr Jarold Motto Reason for Call: Refill Medication Summary of Call: Really bad reflux. Pain is so bad she cant even move her head sometimes.  Found a medicine in her drawer a medicine that helped her reflux she thinks it might be Librax.  Wonders if we can refill it for this one time-refill it without an office visit.  Helps her spasm, Uses [facility] on [address].  Started cryiing and told me she has been having problems with her insurance and job. Decided she is retiring really soon and will get Medicare and a supplemental. Promises that as soon as she has this in place she will come in and follow up like she has been for many many years with Dr Jarold Motto.  Initial call taken by: Leanor Kail [facility],  February 26, 2010 12:36 PM  Follow-up for Phone Call        Refill given on generic librax.  Pt req this for esophageal spasms.  (see OV note of 11/24/08) Follow-up by: Ashok Cordia RN,  February 26, 2010 12:53 PM    New/Updated Medications: CLIDINIUM-CHLORDIAZEPOXIDE 2.5-5 MG CAPS (CLIDINIUM-CHLORDIAZEPOXIDE) 1 by mouth tid  as needed Prescriptions: CLIDINIUM-CHLORDIAZEPOXIDE 2.5-5 MG CAPS (CLIDINIUM-CHLORDIAZEPOXIDE) 1 by mouth tid  as needed  #90 x 0   Entered by:   Ashok Cordia RN   Authorized by:   Mardella Layman MD [facility]   Signed by:   Ashok Cordia RN on 02/26/2010   Method used:   Electronically to        [facility]* (retail)       [address]       [place], [place]  [address]       Ph: [phone]       Fax: [phone]   RxID:   [id number]

## 2010-12-18 NOTE — Consult Note (Signed)
Summary: [facility]  [facility]   Imported By: Sherian Rein 07/06/2010 10:46:01  _____________________________________________________________________  External Attachment:    Type:   Image     Comment:   External Document

## 2010-12-18 NOTE — Assessment & Plan Note (Signed)
Summary: 2-3 WK AFTER STARTING NEW MEDS/NWS   Vital Signs:  Patient profile:   65 year old female Height:      67 inches Weight:      157 pounds BMI:     24.68 Temp:     98.3 degrees F oral Pulse rate:   68 / minute Pulse rhythm:   regular Resp:     16 per minute BP sitting:   118 / 72  (left arm) Cuff size:   regular  Vitals Entered By: Lanier Prude, Beverly Gust) (July 16, 2010 10:16 AM) CC: f/u   Primary Care Provider:  Sonda Primes, MD  CC:  f/u.  History of Present Illness: Patient was scheduled for apt at [facility] by Dr Jarold Motto. She went to apt yesterday. Over weekend vertigo started, exercises helped. When going to apt she became frustrated from no being familiar with parking & where to go, it was also hot. When being weighed she had some vertigo and was off balance. This became worse and she felt the "room spinning" while discussing w/GI for second opinion. MD was concerned that she was by herself. They did not let her leave and also were concerned b/c she was "rambling". This fast talking is not new for her. She was escorted to the ER by security. Labs, CT & psych evaluated her. She was d/c'd with instructions about vertigo, which she already is aware of. Psych cleared her and they let her go home. GI will be contacting patient w/apts for further testing.  C/o stress,depression. F/u anxiety, insomnia, FMS.   Current Medications (verified): 1)  Nexium 40 Mg  Cpdr (Esomeprazole Magnesium) .... Two Times A Day 2)  Tamoxifen Citrate 20 Mg  Tabs (Tamoxifen Citrate) .Marland Kitchen.. 1 By Mouth Qd 3)  Gabapentin 600 Mg  Tabs (Gabapentin) .Marland Kitchen.. 1 By Mouth Tid 4)  Meloxicam 15 Mg  Tabs (Meloxicam) .Marland Kitchen.. 1 By Mouth Once Daily Pc Prn 5)  Ocuvite Adult 50+   Caps (Multiple Vitamins-Minerals) .... Once Daily 6)  Zolpidem Tartrate 10 Mg  Tabs (Zolpidem Tartrate) .... 1/2 or 1 By Mouth At [facility] Prn 7)  Vitamin D3 1000 Unit  Tabs (Cholecalciferol) .Marland Kitchen.. 1 Qd 8)  Carafate 1 Gm Tabs (Sucralfate) .Marland Kitchen.. 1  By Mouth Qid  As Needed 9)  Alprazolam 0.25 Mg Tabs (Alprazolam) .Marland Kitchen.. 1 By Mouth Two Times A Day As Needed 10)  Cobal-1000 1000 Mcg/ml Soln (Cyanocobalamin) .Marland Kitchen.. 1 Ml Every 2 Wks To 1 Mth 11)  Bd Insulin Syringe Ultrafine 29g X 1/2" 1 Ml Misc (Insulin Syringe-Needle U-100) .... As Dirrected For Vit B12 12)  Loratadine 10 Mg  Tabs (Loratadine) .... Once Daily As Needed Allergies 13)  Valtrex 500 Mg Tabs (Valacyclovir Hcl) .Marland Kitchen.. 1 By Mouth Two Times A Day Prn 14)  Losartan Potassium 100 Mg Tabs (Losartan Potassium) .Marland Kitchen.. 1 By Mouth Once Daily For Blood Pressure 15)  Clidinium-Chlordiazepoxide 2.5-5 Mg Caps (Clidinium-Chlordiazepoxide) .Marland Kitchen.. 1 By Mouth Tid  As Needed 16)  Gelnique 10 % Gel (Oxybutynin Chloride) .... Use On Skin Once Daily For Bladder Issues 17)  Gi Cocktail (Equal Parts Maalox, Donnatal, Xylocaine) .Marland Kitchen.. 1 Tbsp 1 4-6 Hrs As Needed 18)  Cymbalta 30 Mg Cpep (Duloxetine Hcl) .Marland Kitchen.. 1 Once Daily  Allergies (verified): 1)  ! Sodium Lauryl Sulfate (Sodium Lauryl Sulfate) 2)  ! Lovastatin (Lovastatin) 3)  ! Sulfa 4)  Symbyax (Olanzapine-Fluoxetine Hcl) 5)  Wellbutrin Xl (Bupropion Hcl)  Past History:  Past Medical History: Last updated: 07/19/2009 Osteoarthritis Chest wall  neuralgia, post-op FMS  Current Problems:  HEMORRHOIDS, INTERNAL (ICD-455.0) IBS (ICD-564.1) ESOPHAGEAL STRICTURE (ICD-530.3) HIATAL HERNIA (ICD-553.3) CHEST PAIN, UNSPECIFIED (ICD-786.50) UPPER RESPIRATORY INFECTION (URI) (ICD-465.9) B12 DEFICIENCY (ICD-266.2) FATIGUE (ICD-780.79) OSTEOARTHRITIS (ICD-715.90) CRAMPS,LEG (ICD-729.82) DEPRESSION (ICD-311) BREAST CANCER, HX OF (ICD-V10.3) ANXIETY (ICD-300.00) NEURALGIA (ICD-729.2) SYMPTOM, HEADACHE (ICD-784.0) RESTLESS LEG SYNDROME (ICD-333.94) INSOMNIA (ICD-780.52) FIBROMYALGIA (ICD-729.1) HYPERTENSION (ICD-401.9) Cold sores    Past Surgical History: Last updated: 05/22/2010 Hysterectomy Tonsillectomy Mastectomy B  total Cholecystectomy  2010  Social History: Last updated: 05/09/2010 Occupation: Psychologist, forensic - retired again 2011 Married Never Smoked Alcohol Use - no Illicit Drug Use - no Regular exercise-yes walking  Review of Systems       The patient complains of abdominal pain and depression.  The patient denies fever, weight loss, weight gain, syncope, and dyspnea on exertion.         denies mania  Physical Exam  General:  Well developed, well nourished, no acute distress. Not sad  Head:  Normocephalic and atraumatic without obvious abnormalities. No apparent alopecia or balding. Eyes:  No corneal or conjunctival inflammation noted. EOMI. Perrla.  Ears:  External ear exam shows no significant lesions or deformities.  Otoscopic examination reveals clear canals, tympanic membranes are intact bilaterally without bulging, retraction, inflammation or discharge. Hearing is grossly normal bilaterally. Nose:  External nasal examination shows no deformity or inflammation. Nasal mucosa are pink and moist without lesions or exudates. Mouth:  Oral mucosa and oropharynx without lesions or exudates.  Teeth in good repair. Neck:  No deformities, masses, or tenderness noted. Chest Wall:  No deformities, masses, or tenderness noted. Lungs:  Normal respiratory effort, chest expands symmetrically. Lungs are clear to auscultation, no crackles or wheezes. Heart:  Normal rate and regular rhythm. S1 and S2 normal without gallop, murmur, click, rub or other extra sounds. Abdomen:  S/NT RLQ sensitive to palpation Msk:  No deformity or scoliosis noted of thoracic or lumbar spine.  Decr ROM LS spine Pulses:  R and L carotid,radial,femoral,dorsalis pedis and posterior tibial pulses are full and equal bilaterally Extremities:  No clubbing, cyanosis, edema, or deformity noted with normal full range of motion of all joints.   Neurologic:  No cranial nerve deficits noted. Station and gait are normal. Plantar reflexes are down-going  bilaterally. DTRs are symmetrical throughout. Sensory, motor and coordinative functions appear intact. Skin:  Intact without suspicious lesions or rashes Psych:  Oriented X3, not suicidal, not homicidal, and depressed affect.  labile affect, tearful, and slightly anxious.  Pressured speech as usual   Impression & Recommendations:  Problem # 1:  DEPRESSION (ICD-311) Assessment Improved  The following medications were removed from the medication list:    Cymbalta 30 Mg Cpep (Duloxetine hcl) .Marland Kitchen... 1 once daily Her updated medication list for this problem includes:    Alprazolam 0.25 Mg Tabs (Alprazolam) .Marland Kitchen... 1 by mouth two times a day as needed    Cymbalta 60 Mg Cpep (Duloxetine hcl) .Marland Kitchen... 1 by mouth once daily for depression  Problem # 2:  ABDOMINAL PAIN, EPIGASTRIC (ICD-789.06) Assessment: Unchanged GI w/u at [facility] is pending   Problem # 3:  B12 DEFICIENCY (ICD-266.2) Assessment: Comment Only On the regimen of medicine(s) reflected in the chart    Problem # 4:  BREAST CANCER, HX OF (ICD-V10.3) Assessment: Unchanged  Problem # 5:  FIBROMYALGIA (ICD-729.1) Assessment: Improved  Her updated medication list for this problem includes:    Meloxicam 15 Mg Tabs (Meloxicam) .Marland Kitchen... 1 by mouth once daily pc prn  Problem #  6:  WEIGHT LOSS (ICD-783.21) Assessment: Improved  Problem # 7:  Pressured speech/anxiety Assessment: Unchanged We discussed meds to try - Depakote, Lithium etc. She will think about it. We will try samples of Seroquel XR The office visit took longer than 45 min with patient councelling for more than 50% of the 45 min  Records from [facility] reviewed.  Complete Medication List: 1)  Nexium 40 Mg Cpdr (Esomeprazole magnesium) .... Two times a day 2)  Tamoxifen Citrate 20 Mg Tabs (Tamoxifen citrate) .Marland Kitchen.. 1 by mouth qd 3)  Gabapentin 600 Mg Tabs (Gabapentin) .Marland Kitchen.. 1 by mouth tid 4)  Meloxicam 15 Mg Tabs (Meloxicam) .Marland Kitchen.. 1 by mouth once daily pc prn 5)  Ocuvite  Adult 50+ Caps (Multiple vitamins-minerals) .... Once daily 6)  Zolpidem Tartrate 10 Mg Tabs (Zolpidem tartrate) .... 1/2 or 1 by mouth at hs prn 7)  Vitamin D3 1000 Unit Tabs (Cholecalciferol) .Marland Kitchen.. 1 qd 8)  Carafate 1 Gm Tabs (Sucralfate) .Marland Kitchen.. 1 by mouth qid  as needed 9)  Alprazolam 0.25 Mg Tabs (Alprazolam) .Marland Kitchen.. 1 by mouth two times a day as needed 10)  Cobal-1000 1000 Mcg/ml Soln (Cyanocobalamin) .Marland Kitchen.. 1 ml every 2 wks to 1 mth 11)  Bd Insulin Syringe Ultrafine 29g X 1/2" 1 Ml Misc (Insulin syringe-needle u-100) .... As dirrected for vit b12 12)  Loratadine 10 Mg Tabs (Loratadine) .... Once daily as needed allergies 13)  Valtrex 500 Mg Tabs (Valacyclovir hcl) .Marland Kitchen.. 1 by mouth two times a day prn 14)  Losartan Potassium 100 Mg Tabs (Losartan potassium) .Marland Kitchen.. 1 by mouth once daily for blood pressure 15)  Clidinium-chlordiazepoxide 2.5-5 Mg Caps (Clidinium-chlordiazepoxide) .Marland Kitchen.. 1 by mouth tid  as needed 16)  Gelnique 10 % Gel (Oxybutynin chloride) .... Use on skin once daily for bladder issues 17)  Cymbalta 60 Mg Cpep (Duloxetine hcl) .Marland Kitchen.. 1 by mouth once daily for depression 18)  Gi Cocktail (equal Parts Maalox, Donnatal, Xylocaine)  .Marland Kitchen.. 1 tbsp 1 4-6 hrs as needed 19)  Seroquel Xr 50 Mg Xr24h-tab (Quetiapine fumarate) .Marland Kitchen.. 1 by mouth qhs  Other Orders: Flu Vaccine 26yrs + (16109) Administration Flu vaccine - MCR (U0454) Radiology Referral (Radiology)  Patient Instructions: 1)  Please schedule a follow-up appointment in 6 weeks. Prescriptions: ZOLPIDEM TARTRATE 10 MG  TABS (ZOLPIDEM TARTRATE) 1/2 or 1 by mouth at hs prn  #90 x 1   Entered and Authorized by:   Tresa Garter MD   Signed by:   Tresa Garter MD on 07/16/2010   Method used:   Print then Give to Patient   RxID:   [id number] MELOXICAM 15 MG  TABS (MELOXICAM) 1 by mouth once daily pc prn  #90 Tablet x 3   Entered and Authorized by:   Tresa Garter MD   Signed by:   Tresa Garter MD on 07/16/2010    Method used:   Print then Give to Patient   RxID:   [id number] GABAPENTIN 600 MG  TABS (GABAPENTIN) 1 by mouth tid  #270 x 3   Entered and Authorized by:   Tresa Garter MD   Signed by:   Tresa Garter MD on 07/16/2010   Method used:   Print then Give to Patient   RxID:   [id number] TAMOXIFEN CITRATE 20 MG  TABS (TAMOXIFEN CITRATE) 1 by mouth qd  #90 x 3   Entered and Authorized by:   Tresa Garter MD   Signed by:   Macarthur Critchley  V Jamerson Vonbargen MD on 07/16/2010   Method used:   Print then Give to Patient   RxID:   [id number] NEXIUM 40 MG  CPDR (ESOMEPRAZOLE MAGNESIUM) two times a day  #180 x 3   Entered and Authorized by:   Tresa Garter MD   Signed by:   Tresa Garter MD on 07/16/2010   Method used:   Print then Give to Patient   RxID:   [id number] CYMBALTA 60 MG CPEP (DULOXETINE HCL) 1 by mouth once daily for depression  #90 x 3   Entered and Authorized by:   Tresa Garter MD   Signed by:   Tresa Garter MD on 07/16/2010   Method used:   Print then Give to Patient   RxID:   [id number] SEROQUEL XR 50 MG XR24H-TAB (QUETIAPINE FUMARATE) 1 by mouth qhs  #90 x 3   Entered and Authorized by:   Tresa Garter MD   Signed by:   Tresa Garter MD on 07/16/2010   Method used:   Print then Give to Patient   RxID:   [id number] CYMBALTA 60 MG CPEP (DULOXETINE HCL) 1 by mouth once daily for depression  #30 x 6   Entered and Authorized by:   Tresa Garter MD   Signed by:   Tresa Garter MD on 07/16/2010   Method used:   Print then Give to Patient   RxID:   [id number]  Flu Vaccine Consent Questions     Do you have a history of severe allergic reactions to this vaccine? no    Any prior history of allergic reactions to egg and/or gelatin? no    Do you have a sensitivity to the preservative Thimersol? no    Do you have a past history of Guillan-Barre Syndrome? no    Do you currently have an acute  febrile illness? no    Have you ever had a severe reaction to latex? no    Vaccine information given and explained to patient? yes    Are you currently pregnant? no    Lot Number:[id number]   Exp Date:05/18/2011   Site Given  Left Deltoid IM....Marland KitchenMarland KitchenLanier Prude, [facility])  July 16, 2010 11:55 AM

## 2010-12-18 NOTE — Letter (Signed)
Summary: Generic Letter  [facility]  [address] [place], [place] [address]   Phone: [phone]  Fax: [phone]    12/08/2008  YH:[id number] Hynes [address] [place], [place]  [address]  To Whom it may concern:  Ms Hege is unable to stand all day at work and needs to have a stool to be able to sit on it intermittentely due to her health reasons.  Sincerely,   Jacinta Shoe MD [facility]

## 2010-12-18 NOTE — Progress Notes (Signed)
Summary: [facility]  Phone Note Call from Patient Call back at Home Phone [phone]   Summary of Call: Patient was scheduled for apt at [facility] by Dr Jarold Motto. She went to apt yesterday. Over weekend vertigo started, exercises helped. When going to apt she became frustrated from no being familiar with parking & where to go, it was also hot. When being weighed she had some vertigo and was off balance. This became worse and she felt the "room spinning" while discussing w/GI for second opinion. MD was concerned that she was by herself. They did not let her leave and also were concerned b/c she was "rambling". This fast talking is not new for her. She was escorted to the ER by security. Labs, CT & psych evaluated her. She was d/c'd with instructions about vertigo, which she already is aware of. Psych cleared her and they let her go home. GI will be contacting patient w/apts for further testing.   She will have records sent to Dr Posey Rea.   Patient wants to know if she could change from prozac to cymbalta? Please advise.   Also, vertigo is better after resting and doing exercises.  Initial call taken by: Lamar Sprinkles, CMA,  June 27, 2010 12:39 PM  Follow-up for Phone Call        Sorry about her incident! I would try Cymbalta when she is feeling normal; wait at least till next wk Follow-up by: Tresa Garter MD,  June 27, 2010 1:30 PM  Additional Follow-up for Phone Call Additional follow up Details #1::        Can I give patient samples? What milligram?  Additional Follow-up by: Lamar Sprinkles, CMA,  June 27, 2010 1:30 PM    Additional Follow-up for Phone Call Additional follow up Details #2::    ok 30 mg 2 wks and ROV Follow-up by: Tresa Garter MD,  June 27, 2010 1:34 PM  Additional Follow-up for Phone Call Additional follow up Details #3:: Details for Additional Follow-up Action Taken: Pt informed, samples given, EMR updated. Additional Follow-up by: Lamar Sprinkles, CMA,  June 28, 2010 9:47 AM  New/Updated Medications: CYMBALTA 30 MG CPEP (DULOXETINE HCL) 1 once daily Prescriptions: CYMBALTA 30 MG CPEP (DULOXETINE HCL) 1 once daily  #14 x 0   Entered by:   Lamar Sprinkles, CMA   Authorized by:   Tresa Garter MD   Signed by:   Lamar Sprinkles, CMA on 06/28/2010   Method used:   Samples Given   RxID:   [id number]

## 2010-12-18 NOTE — Assessment & Plan Note (Signed)
Summary: YEARLY FU/ MEDICARE /LABS AFTER/ TO COME FASTING/ NWS  #   Vital Signs:  Patient profile:   65 year old female Height:      67 inches Weight:      164 pounds BMI:     25.78 Temp:     97.9 degrees F oral Pulse rate:   72 / minute Pulse rhythm:   regular Resp:     16 per minute BP sitting:   122 / 78  (left arm) Cuff size:   regular  Vitals Entered By: Lanier Prude, Beverly Gust) (October 17, 2010 10:44 AM) CC: MWV Is Patient Diabetic? No   Primary Care Farzana Koci:  Sonda Primes, MD  CC:  M[facility].  History of Present Illness: The patient presents for a preventive health examination  Patient past medical history, social history, and family history reviewed in detail no significant changes.  Patient is physically active. Depression is negative and mood is good. Hearing is normal, and able to perform activities of daily living. Risk of falling is negligible and home safety has been reviewed and is appropriate. Patient has normal height, weight, and visual acuity is good w/glasses. Patient has been counseled on age-appropriate routine health concerns for screening and prevention. Education, counseling done.  F/u Vit B12 def, FMS, depression C/o epig discomfort and L side of abd bulge  Preventive Screening-Counseling & Management  Alcohol-Tobacco     Alcohol drinks/day: 0     Smoking Status: quit > 6 months  Caffeine-Diet-Exercise     Caffeine use/day: 1 cup     Does Patient Exercise: yes     Type of exercise: walking     Times/week: 7  Hep-HIV-STD-Contraception     Hepatitis Risk: no risk noted     Dental Visit-last 6 months yes     SBE monthly: yes     Sun Exposure-Excessive: yes  Safety-Violence-Falls     Seat Belt Use: yes     Helmet Use: n/a     Firearms in the Home: firearms in the home     Smoke Detectors: yes     Violence in the Home: no risk noted     Sexual Abuse: no     Fall Risk: no      Sexual History:  currently monogamous.    Current Medications  (verified): 1)  Nexium 40 Mg  Cpdr (Esomeprazole Magnesium) .... Two Times A Day 2)  Tamoxifen Citrate 20 Mg  Tabs (Tamoxifen Citrate) .Marland Kitchen.. 1 By Mouth Qd 3)  Gabapentin 600 Mg  Tabs (Gabapentin) .Marland Kitchen.. 1 By Mouth Tid 4)  Meloxicam 15 Mg  Tabs (Meloxicam) .Marland Kitchen.. 1 By Mouth Once Daily Pc Prn 5)  Ocuvite Adult 50+   Caps (Multiple Vitamins-Minerals) .... Once Daily 6)  Zolpidem Tartrate 10 Mg  Tabs (Zolpidem Tartrate) .... 1/2 or 1 By Mouth At [facility] Prn 7)  Alprazolam 0.25 Mg Tabs (Alprazolam) .Marland Kitchen.. 1 By Mouth Two Times A Day As Needed 8)  Cobal-1000 1000 Mcg/ml Soln (Cyanocobalamin) .Marland Kitchen.. 1 Ml Every 2 Wks To 1 Mth 9)  Bd Insulin Syringe Ultrafine 29g X 1/2" 1 Ml Misc (Insulin Syringe-Needle U-100) .... As Dirrected For Vit B12 10)  Loratadine 10 Mg  Tabs (Loratadine) .... Once Daily As Needed Allergies 11)  Losartan Potassium 100 Mg Tabs (Losartan Potassium) .Marland Kitchen.. 1 By Mouth Once Daily For Blood Pressure 12)  Cymbalta 60 Mg Cpep (Duloxetine Hcl) .Marland Kitchen.. 1 By Mouth Once Daily For Depression 13)  Vitamin D 1000 Unit Tabs (Cholecalciferol) .Marland KitchenMarland KitchenMarland Kitchen  2 By Mouth Once Daily  Allergies (verified): 1)  ! Sodium Lauryl Sulfate (Sodium Lauryl Sulfate) 2)  ! Lovastatin (Lovastatin) 3)  ! Sulfa 4)  Symbyax (Olanzapine-Fluoxetine Hcl) 5)  Wellbutrin Xl (Bupropion Hcl)  Past History:  Past Medical History: Last updated: 07/19/2009 Osteoarthritis Chest wall neuralgia, post-op FMS  Current Problems:  HEMORRHOIDS, INTERNAL (ICD-455.0) IBS (ICD-564.1) ESOPHAGEAL STRICTURE (ICD-530.3) HIATAL HERNIA (ICD-553.3) CHEST PAIN, UNSPECIFIED (ICD-786.50) UPPER RESPIRATORY INFECTION (URI) (ICD-465.9) B12 DEFICIENCY (ICD-266.2) FATIGUE (ICD-780.79) OSTEOARTHRITIS (ICD-715.90) CRAMPS,LEG (ICD-729.82) DEPRESSION (ICD-311) BREAST CANCER, HX OF (ICD-V10.3) ANXIETY (ICD-300.00) NEURALGIA (ICD-729.2) SYMPTOM, HEADACHE (ICD-784.0) RESTLESS LEG SYNDROME (ICD-333.94) INSOMNIA (ICD-780.52) FIBROMYALGIA  (ICD-729.1) HYPERTENSION (ICD-401.9) Cold sores    Past Surgical History: Last updated: 05/22/2010 Hysterectomy Tonsillectomy Mastectomy B  total Cholecystectomy 2010  Family History: Last updated: 11/24/2008 Mother: lymphoma B MG, CAD Breast cancer: Paternal grandmother Family History of Diabetes: Mother, Brother, sister Family History of Heart Disease: Sister Family History of Kidney Disease:Mother, brother  Social History: Last updated: 05/09/2010 Occupation: Psychologist, forensic - retired again 2011 Married Never Smoked Alcohol Use - no Illicit Drug Use - no Regular exercise-yes walking  Social History: Smoking Status:  quit > 6 months Caffeine use/day:  1 cup Dental Care w/in 6 mos.:  yes Sun Exposure-Excessive:  yes Seat Belt Use:  yes Fall Risk:  no Hepatitis Risk:  no risk noted Sexual History:  currently monogamous  Review of Systems       The patient complains of abdominal pain.  The patient denies anorexia, fever, weight loss, weight gain, vision loss, decreased hearing, hoarseness, chest pain, syncope, dyspnea on exertion, peripheral edema, prolonged cough, headaches, hemoptysis, melena, hematochezia, severe indigestion/heartburn, hematuria, incontinence, genital sores, muscle weakness, suspicious skin lesions, transient blindness, difficulty walking, depression, unusual weight change, abnormal bleeding, enlarged lymph nodes, angioedema, and breast masses.    Physical Exam  General:  Well-developed,well-nourished,in no acute distress; alert,appropriate and cooperative throughout examination Head:  Normocephalic and atraumatic without obvious abnormalities. No apparent alopecia or balding. Eyes:  No corneal or conjunctival inflammation noted. EOMI. Perrla.  Ears:  External ear exam shows no significant lesions or deformities.  Otoscopic examination reveals clear canals, tympanic membranes are intact bilaterally without bulging, retraction, inflammation or  discharge. Hearing is grossly normal bilaterally. Nose:  External nasal examination shows no deformity or inflammation. Nasal mucosa are pink and moist without lesions or exudates. Mouth:  Oral mucosa and oropharynx without lesions or exudates.  Teeth in good repair. Neck:  No deformities, masses, or tenderness noted. Lungs:  Normal respiratory effort, chest expands symmetrically. Lungs are clear to auscultation, no crackles or wheezes. Heart:  Normal rate and regular rhythm. S1 and S2 normal without gallop, murmur, click, rub or other extra sounds. Abdomen:  S/NT RLQ sensitive to palpation Msk:  No deformity or scoliosis noted of thoracic or lumbar spine.  Decr ROM LS spine. NT  Pulses:  R and L carotid,radial,femoral,dorsalis pedis and posterior tibial pulses are full and equal bilaterally Extremities:  No clubbing, cyanosis, edema, or deformity noted with normal full range of motion of all joints.   Neurologic:  No cranial nerve deficits noted. Station and gait are normal. Plantar reflexes are down-going bilaterally. DTRs are symmetrical throughout. Sensory, motor and coordinative functions appear intact. Skin:  Intact without suspicious lesions or rashes L mid abd 4 cm oval palpabale subcutaneouse NT mass - [place] is c/w lipoma Cervical Nodes:  No lymphadenopathy noted Inguinal Nodes:  No significant adenopathy Psych:  Oriented X3, not suicidal, not homicidal, and  depressed affect.  labile affect, tearful, and slightly anxious.  Pressured speech as usual   Impression & Recommendations:  Problem # 1:  HEALTH MAINTENANCE EXAM (ICD-V70.0) Assessment New Overall doing well, age appropriate education and counseling updated and referral for appropriate preventive services done unless declined, immunizations up to date or declined, diet counseling done if overweight, urged to quit smoking if smokes, most recent labs reviewed and current ordered if appropriate, ecg reviewed or declined  (interpretation per ECG scanned in the EMR if done); information regarding Medicare Preventation requirements given if appropriate.  I have personally reviewed the Medicare Annual Wellness questionnaire and have noted 1.   The patient's medical and social history 2.   Their use of alcohol, tobacco or illicit drugs 3.   Their current medications and supplements 4.   The patient's functional ability including ADL's, fall risks, home safety risks and hearing or visual             impairment. 5.   Diet and physical activities 6.   Evidence for depression or mood disorders  The patients weight, height, BMI and visual acuity have been recorded in the chart I have made referrals, counseling and provided education to the patient based review of the above and I have provided the pt with a written personalized care plan for preventive services.    Orders: TLB-BMP (Basic Metabolic Panel-BMET) (80048-METABOL) TLB-CBC Platelet - w/Differential (85025-CBCD) TLB-Hepatic/Liver Function Pnl (80076-HEPATIC) TLB-TSH (Thyroid Stimulating Hormone) (84443-TSH) TLB-Udip ONLY (81003-UDIP) Medicare -1st Annual Wellness Visit [phone])  Problem # 2:  ABDOMINAL PAIN, EPIGASTRIC (ICD-789.06) Assessment: Improved Treat GERD prn  Problem # 3:  B12 DEFICIENCY (ICD-266.2) Assessment: Improved  Orders: Vit B12 1000 mcg (J3420) Admin of Therapeutic Inj  intramuscular or subcutaneous (27253)  Problem # 4:  FIBROMYALGIA (ICD-729.1) Assessment: Improved  Her updated medication list for this problem includes:    Meloxicam 15 Mg Tabs (Meloxicam) .Marland Kitchen... 1 by mouth once daily pc prn  Problem # 5:  FREQUENCY, URINARY (ICD-788.41) Assessment: Unchanged UA is abn - her urine cx was neg. Will not treat w/abx  Problem # 6:  LIPOMA (ICD-214.9) L abd Assessment: Unchanged Bedside US was c/w lipoma  Complete Medication List: 1)  Nexium 40 Mg Cpdr (Esomeprazole magnesium) .... Two times a day 2)  Tamoxifen Citrate 20 Mg  Tabs (Tamoxifen citrate) .Marland Kitchen.. 1 by mouth qd 3)  Gabapentin 600 Mg Tabs (Gabapentin) .Marland Kitchen.. 1 by mouth tid 4)  Meloxicam 15 Mg Tabs (Meloxicam) .Marland Kitchen.. 1 by mouth once daily pc prn 5)  Ocuvite Adult 50+ Caps (Multiple vitamins-minerals) .... Once daily 6)  Zolpidem Tartrate 10 Mg Tabs (Zolpidem tartrate) .... 1/2 or 1 by mouth at hs prn 7)  Alprazolam 0.25 Mg Tabs (Alprazolam) .Marland Kitchen.. 1 by mouth two times a day as needed 8)  Cobal-1000 1000 Mcg/ml Soln (Cyanocobalamin) .Marland Kitchen.. 1 ml every 2 wks to 1 mth 9)  Bd Insulin Syringe Ultrafine 29g X 1/2" 1 Ml Misc (Insulin syringe-needle u-100) .... As dirrected for vit b12 10)  Loratadine 10 Mg Tabs (Loratadine) .... Once daily as needed allergies 11)  Losartan Potassium 100 Mg Tabs (Losartan potassium) .Marland Kitchen.. 1 by mouth once daily for blood pressure 12)  Cymbalta 60 Mg Cpep (Duloxetine hcl) .Marland Kitchen.. 1 by mouth once daily for depression 13)  Vitamin D 1000 Unit Tabs (Cholecalciferol) .... 2 by mouth once daily  Patient Instructions: 1)  Please schedule a follow-up appointment in 4 months. 2)  Vit B12 3)  BMET  266.20  995.20 Prescriptions: COBAL-1000 1000 MCG/ML SOLN (CYANOCOBALAMIN) 1 ml every 2 wks to 1 mth  #10 ml x 6   Entered and Authorized by:   Tresa Garter MD   Signed by:   Tresa Garter MD on 10/17/2010   Method used:   Electronically to        [facility]* (retail)       [address] [place], [place]  [address]       Ph: [phone]       Fax: [phone]   RxID:   [id number]    Medication Administration  Injection # 1:    Medication: Vit B12 1000 mcg    Diagnosis: B12 DEFICIENCY (ICD-266.2)    Route: IM    Site: L deltoid    Exp Date: 08/01/203    Lot #: [id number]    Mfr: [employer] Regent    Patient tolerated injection without complications    Given by: Lanier Prude, CMA(AAMA) (October 17, 2010 11:38 AM)  Orders Added: 1)  TLB-BMP (Basic Metabolic Panel-BMET) [80048-METABOL] 2)  TLB-CBC  Platelet - w/Differential [85025-CBCD] 3)  TLB-Hepatic/Liver Function Pnl [80076-HEPATIC] 4)  TLB-TSH (Thyroid Stimulating Hormone) [84443-TSH] 5)  TLB-Udip ONLY [81003-UDIP] 6)  Vit B12 1000 mcg [J3420] 7)  Admin of Therapeutic Inj  intramuscular or subcutaneous [96372] 8)  Medicare -1st Annual Wellness Visit [G0438] 9)  Est. Patient Level IV [84132]

## 2010-12-27 ENCOUNTER — Encounter (HOSPITAL_BASED_OUTPATIENT_CLINIC_OR_DEPARTMENT_OTHER): Payer: Medicare Other | Admitting: Oncology

## 2010-12-27 DIAGNOSIS — Z170 Estrogen receptor positive status [ER+]: Secondary | ICD-10-CM

## 2010-12-27 DIAGNOSIS — C50919 Malignant neoplasm of unspecified site of unspecified female breast: Secondary | ICD-10-CM

## 2010-12-27 LAB — CBC WITH DIFFERENTIAL/PLATELET
Basophils Absolute: 0 10*3/uL (ref 0.0–0.1)
Eosinophils Absolute: 0.2 10*3/uL (ref 0.0–0.5)
HGB: 13.4 g/dL (ref 11.6–15.9)
MCV: 93.7 fL (ref 79.5–101.0)
MONO#: 0.3 10*3/uL (ref 0.1–0.9)
NEUT#: 2.6 10*3/uL (ref 1.5–6.5)
RDW: 12.9 % (ref 11.2–14.5)
lymph#: 1.6 10*3/uL (ref 0.9–3.3)

## 2010-12-27 LAB — COMPREHENSIVE METABOLIC PANEL
Albumin: 3.8 g/dL (ref 3.5–5.2)
BUN: 21 mg/dL (ref 6–23)
CO2: 27 mEq/L (ref 19–32)
Calcium: 8.5 mg/dL (ref 8.4–10.5)
Chloride: 105 mEq/L (ref 96–112)
Glucose, Bld: 134 mg/dL — ABNORMAL HIGH (ref 70–99)
Potassium: 4.2 mEq/L (ref 3.5–5.3)

## 2010-12-28 ENCOUNTER — Encounter: Payer: Self-pay | Admitting: Internal Medicine

## 2011-01-16 ENCOUNTER — Encounter: Payer: Self-pay | Admitting: Internal Medicine

## 2011-01-23 ENCOUNTER — Encounter: Payer: Self-pay | Admitting: Internal Medicine

## 2011-01-24 NOTE — Letter (Signed)
Summary: [facility]  [facility]   Imported By: Sherian Rein 01/18/2011 12:14:30  _____________________________________________________________________  External Attachment:    Type:   Image     Comment:   External Document

## 2011-01-29 NOTE — Miscellaneous (Signed)
Summary: mammogram 2012  Clinical Lists Changes  Observations: Added new observation of MAMMOGRAM: normal (01/16/2011 9:53)      Preventive Care Screening  Mammogram:    Date:  01/16/2011    Results:  normal

## 2011-02-07 ENCOUNTER — Other Ambulatory Visit: Payer: Self-pay

## 2011-02-18 ENCOUNTER — Ambulatory Visit: Payer: Self-pay | Admitting: Internal Medicine

## 2011-03-05 LAB — URINALYSIS, ROUTINE W REFLEX MICROSCOPIC
Nitrite: NEGATIVE
Specific Gravity, Urine: 1.013 (ref 1.005–1.030)
Urobilinogen, UA: 0.2 mg/dL (ref 0.0–1.0)
pH: 5.5 (ref 5.0–8.0)

## 2011-03-05 LAB — URINE MICROSCOPIC-ADD ON

## 2011-03-05 LAB — COMPREHENSIVE METABOLIC PANEL
BUN: 22 mg/dL (ref 6–23)
CO2: 26 mEq/L (ref 19–32)
Chloride: 108 mEq/L (ref 96–112)
Creatinine, Ser: 1.05 mg/dL (ref 0.4–1.2)
GFR calc non Af Amer: 53 mL/min — ABNORMAL LOW (ref >60)
Glucose, Bld: 92 mg/dL (ref 70–99)
Total Bilirubin: 1.1 mg/dL (ref 0.3–1.2)

## 2011-03-05 LAB — DIFFERENTIAL
Basophils Absolute: 0 10*3/uL (ref 0.0–0.1)
Basophils Relative: 0 % (ref 0–1)
Lymphocytes Relative: 32 % (ref 12–46)
Neutro Abs: 3.5 10*3/uL (ref 1.7–7.7)
Neutrophils Relative %: 59 % (ref 43–77)

## 2011-03-05 LAB — BASIC METABOLIC PANEL
CO2: 25 mEq/L (ref 19–32)
Calcium: 9.1 mg/dL (ref 8.4–10.5)
Chloride: 108 mEq/L (ref 96–112)
Creatinine, Ser: 1.03 mg/dL (ref 0.4–1.2)
Glucose, Bld: 86 mg/dL (ref 70–99)
Sodium: 141 mEq/L (ref 135–145)

## 2011-03-05 LAB — CBC
Hemoglobin: 13 g/dL (ref 12.0–15.0)
Platelets: 192 10*3/uL (ref 150–400)
RDW: 13.6 % (ref 11.5–15.5)
WBC: 5.9 10*3/uL (ref 4.0–10.5)

## 2011-03-26 ENCOUNTER — Other Ambulatory Visit: Payer: Self-pay | Admitting: Internal Medicine

## 2011-03-29 ENCOUNTER — Encounter: Payer: Self-pay | Admitting: Internal Medicine

## 2011-04-01 ENCOUNTER — Ambulatory Visit: Payer: Medicare Other | Admitting: Internal Medicine

## 2011-04-02 NOTE — Discharge Summary (Signed)
NAMEJESSELYN, RASK               ACCOUNT NO.:  [id number]   MEDICAL RECORD NO.:  [id number]          PATIENT TYPE:  INP   LOCATION:  5710                         FACILITY:  [facility]   PHYSICIAN:  Etter Sjogren, M.D.     DATE OF BIRTH:  1946/04/22   DATE OF ADMISSION:  06/09/2007  DATE OF DISCHARGE:  06/12/2007                               DISCHARGE SUMMARY   FINAL DIAGNOSIS:  1. Breast cancer.  2. Bilateral [place] breast.   PROCEDURE:  Bilateral breast reconstruction with transverse rectus  abdominis myocutaneous flaps.   SUMMARY OF HISTORY AND PHYSICAL:  A 65 year old woman who has had breast  cancers had bilateral mastectomy and desired breast reconstruction using  her own tissue.  She presents at this time for the breast  reconstruction.  These procedures were discussed with her in great  detail, and she understood them and the risks associated, and wished to  proceed.  For further details of the history and physical, please see  the chart.   COURSE IN THE HOSPITAL:  On admission she was taken to surgery, at which  time the TRAM flaps were performed.  She tolerated that procedure well.  This doctor believed the tissue had excellent color, good capillary  refill at 2 to 2 seconds.  Abdomen looked good.  Drain was functioning.  She was ambulated on the first postoperative day.  She had a low-grade  fever to just over 100 degrees, but that responded to incentive  spirometry.  At the present time she is afebrile.  Vital signs are  stable.  Her postoperative blood count:  Hemoglobin 10, hematocrit 29.3.  Tolerating diet well.  Pain pump catheter was removed today.  Drains are  functioning.  I felt that she was ready to be discharged.   DISPOSITION:  No lifting.  No shower.  She is to use her spirometer at  least 5 times a day at home.  Empty the drains and record the amounts.  Follow up in the office next week for recheck.   PRESCRIPTIONS:  Keflex 500 mg p.o. q.i.d., Robaxin  500 mg 1 p.o. q.12  p.r.n. muscle spasm, and Percocet 5 mg (total of 30) one p.o. q.4 hours  p.r.n. pain.      Etter Sjogren, M.D.  Electronically Signed     DB/MEDQ  D:  06/12/2007  T:  06/12/2007  Job:  [id number]

## 2011-04-02 NOTE — Op Note (Signed)
Claudia [name], Claudia [name]               ACCOUNT NO.:  [id number]   MEDICAL RECORD NO.:  [id number]          PATIENT TYPE:  INP   LOCATION:  2899                         FACILITY:  [facility]   PHYSICIAN:  Etter Sjogren, M.D.     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   PROCEDURE:  1. Right breast reconstruction with a transverse rectus abdominis      myocutaneous flap.  2. Left breast reconstruction with a transverse rectus abdominis      myocutaneous flap.  3. Placement of an On-Q pump.   SURGEON:  Odis Luster   ASSISTANT:  Holderness   ANESTHESIA:  General.   ESTIMATED BLOOD LOSS:  150 mL.   DRAINS:  1. Two Blakes, abdomen.  2. One Blake each breast.   RECONSTRUCTION CLINICAL NOTE:  This 65 year old woman has had breast  cancer, has had bilateral mastectomy, and desires breast reconstruction.  She has undergone delay and preferred to use her own tissue using the  TRAM flap, rather than tissue expansion followed by implant.  Procedures, risks, plus complications discussed with her in great detail  on several occasions.  She understood those risks and wished to proceed.   DESCRIPTION OF PROCEDURE:  The patient was placed in a full standing  position in the holding area and marked for the TRAM flap.  Inframammary  folds also marked.  She was taken to the operating room and placed  supine.  After successful induction of general anesthesia, she was  prepped with Betadine and draped with sterile drapes.  The old  mastectomy scars were opened and the defects recreated, raising superior  and inferior mastectomy flaps.  In addition, the dissection was begun  down the subcutaneous tunnel towards the abdomen where the TRAM flaps  will be  passed through to the chest.  Moist lap was then placed and the  incision made around the umbilicus, leaving a generous steady stalk  around the umbilicus in order to insure its viability.  The upper  incision was made.  The dissection carried out in a cephalad direction,  taking great care to avoid damage to underlying fascia.  This was  dissected just over the rectus muscles only in order to have the best  blood supply possible to the lower abdominal wall at the time of  closure.  The subcutaneous tunnels were connected through-and-through,  achieving hemostasis with electrocautery.  A check was made and it was  found that the defect could be closed with the upper abdominal flap.  The lower limb of the incision was then made and the midline incision  was made and the dissection was carried down to the fascia.  The flaps  were raised laterally up to the lateral row of perforators using  electrocautery.  A parallel incision was made in the anterior fascia  bilaterally, leaving a 2.5-cm wide  strip of fascia on the muscle in  order to preserve blood flow and avoid injury in the underlying blood  flow.  Muscles were then dissected free from the surrounding fascial  attachments very carefully using a scalpel and a bipolar.  The muscles  having been freed completely, attention was then directed back to the  chest, which was irrigated thoroughly with saline, achieving hemostasis  with electrocautery.  Blake drains were positioned and brought out  through separate stab wounds laterally and inferiorly and secured with 3-  0 Prolene sutures.  Moist flaps replaced and the attention directed back  to the abdomen.   The muscles were divided.  The deep inferior gastric vessels were double  Ligaclipped.  The lateral row of perforating vessels to the muscles had  also been Ligaclipped and divided as the flaps were dissected free.  Flaps were passed through the subcutaneous tunnels and into the chest.   They had excellent color, bright red bleeding around the periphery  consistent with viability.  They were temporarily secured with skin  staples.  Check was made to make sure that the muscles were not torqued  or twisted in any manner and they were not.  They were covered with dry  sterile towels in order to maintain some warmth and attention directed  back to the abdomen.  The abdomen was irrigated thoroughly with saline.  The fascia was closed as far as possible using 0-Prolene interrupted  figure-of-8 sutures, taking great care to avoid damage to underlying  intra-abdominal contents.  The abdomen again irrigated with saline and  mesh was placed over this and was secured with 0 Prolene interrupted  horizontal mattress sutures, again taking great care to avoid damage to  the underlying intra-abdominal cavity.  These were placed along the  borders of the fascia and then a 2-0 Prolene simple running suture  around the periphery.  Again, irrigation with saline and umbilicus  brought through the central aspect of the Marlex mesh and the patient  placed in a semi-Fowler's position.  The abdominal closure was achieved  without tension using 2-0 PDS interrupted sutures for the Scarpa's  layer, 2-0 Vicryl interrupted for the dermal sutures, and a running 3-0  Monocryl subcuticular suture.  An incision made in the midline and the  umbilicus brought this.  Again, it was found to be viable.  It was then  set with 3-0 Monocryl interrupted inverted simple sutures.  The  umbilicus had excellent color, as did the abdominal flap closure.  Prior  to the abdominal closure, Blake drains were positioned and brought out  through separate stab wounds inferiorly and secured with 3-0 Prolene  sutures and the On-Q pump catheter was also placed.  We checked for  patency and were coiled in the right lower quadrant below the incision  and secured with Steri-Strips and Tegaderm.   Attention was directed back to the  chest.  The flaps looked very  healthy.  The inferior borders of the flap were then set with 3-0  Monocryl inverted deep dermal sutures.  The markings placed.  The  epithelialization was performed and the flaps were suspended with 3-0  Vicryl sutures, horizontal mattress, to the underlying pectoralis major  muscles along the superior aspect of the mastectomy defects.  The  remaining closures with 3-0 Monocryl inverted deep sutures and 3-0  Monocryl running subcuticular suture.  Again, the flaps continued to  have excellent color and bright red bleeding around periphery,  consistent with viability.  Steri-Strips, dry sterile dressings applied.  She was transferred to recovery stable, having tolerated the procedure  well.      Etter Sjogren, M.D.  Electronically Signed     DB/MEDQ  D:  06/09/2007  T:  06/10/2007  Job:  [id number]   cc:   Etter Sjogren, M.D.

## 2011-04-02 NOTE — Op Note (Signed)
Claudia [name], Claudia [name]               ACCOUNT NO.:  [id number]   MEDICAL RECORD NO.:  [id number]          PATIENT TYPE:  AMB   LOCATION:  DAY                          FACILITY:  [facility]   PHYSICIAN:  Currie Paris, M.D.DATE OF BIRTH:  Feb 11, 1946   DATE OF PROCEDURE:  12/28/2008  DATE OF DISCHARGE:                               OPERATIVE REPORT   PREOPERATIVE DIAGNOSES:  Chronic calculous cholecystitis.   POSTOPERATIVE DIAGNOSES:  Chronic calculous cholecystitis.   OPERATION:  Laparoscopic cholecystectomy with operative cholangiogram.   SURGEON:  Dr. Cyndia Bent   ASSISTANT:  Dr. Wenda Low   ANESTHESIA:  General.   CLINICAL HISTORY:  This is a 65 year old lady, status post bilateral  mastectomies and bilateral TRAM flaps, who has developed biliary type  symptoms, has been evaluated by her gastroenterologist, Dr. Sheryn Bison.  After thorough evaluation, it was felt that at least a  portion of her GI symptoms were attributable to biliary tract disease  and cholecystectomy was recommended.  After discussion with the patient,  she agreed to proceed to laparoscopic cholecystectomy.   DESCRIPTION OF PROCEDURE:  I saw the patient in the holding area and she  had no further questions.  We confirmed the surgery as noted above.   The patient was taken to the operating room.  After satisfactory general  endotracheal anesthesia had been obtained the abdomen was prepped and  draped.  The time-out was done.   I used 0.25% plain Marcaine for each incision.  I made a short incision  just below the umbilicus, identified the fascia, and there was mesh at  this point, which I opened and was able to enter the peritoneal cavity  under direct vision.  I put a pursestring, using 0 Prolene, in and  introduced the Hasson cannula and insufflated the abdomen to 15.   The patient was then placed in reverse Trendelenburg and tilted to the  left.  A 10/11-trocar was placed in the  epigastrium and two 5-mm  laterally.  The peritoneum over the cystic duct area was opened.  I  identified the cystic duct and the cystic artery, made a nice window and  could see the anatomy clearly.  I put one clip on the artery and one on  the duct.   I introduced a Cook catheter percutaneously and I was able to insert  this in the cystic duct and did operative cholangiography.  This  basically was normal with good filling of the common duct, some filling  of the pancreatic duct, no filling defects noted and filling of hepatic  radicles.   The catheter was removed and three clips placed on the stay side of the  cystic duct and it was divided.  Two additional clips were placed on the  cystic artery and it was divided.  Another small branch was also  clipped.  The gallbladder was removed from below to above with  coagulation current with cautery.  Near the top of the gallbladder there  was a little bit of venous oozing from the liver and just prior to  completely disconnecting the gallbladder,  I used cautery on here and  then put some FloSeal plus some Surgicel on to achieve hemostasis.  The  gallbladder was then disconnected.  It was placed in a bag and brought  out the umbilical site.  There abdomen was re-insufflated.  There did  not appear to be any other problems.  The lateral ports were removed  under direct vision.  The umbilical site was closed with a pursestring  plus another suture of 0 Prolene to be sure we had the mesh well-closed.  The abdomen was deflated through the epigastric port.  Skin was closed  with 4-0 Monocryl subcuticular plus Dermabond.   The patient tolerated the procedure well.  There were no operative  complications.  All counts were correct.      Currie Paris, M.D.  Electronically Signed     CJS/MEDQ  D:  12/28/2008  T:  12/28/2008  Job:  [id number]   cc:   Georgina Quint. Plotnikov, MD  [address]  [place]  [place] [address]   Vania Rea.  Jarold Motto, MD, FACG, FACP, FAGA  [address]  [place]  [place] [address]

## 2011-04-05 NOTE — Op Note (Signed)
Claudia [name], Claudia [name]               ACCOUNT NO.:  [id number]   MEDICAL RECORD NO.:  [id number]          PATIENT TYPE:  OIB   LOCATION:  5702                         FACILITY:  [facility]   PHYSICIAN:  Etter Sjogren, M.D.     DATE OF BIRTH:  11/11/1946   DATE OF PROCEDURE:  11/03/2006  DATE OF DISCHARGE:                               OPERATIVE REPORT   PREOPERATIVE DIAGNOSIS:  Right breast cancer.   POSTOPERATIVE DIAGNOSIS:  Right breast cancer with complicated wound of  the abdomen, secondary to delayed procedure, wound greater than 22 cm.   PROCEDURE PERFORMED:  Complex wound closure of the abdomen, greater than  22 cm and PLIF TRAM flap.   SURGEON:  Etter Sjogren, M.D.   ANESTHESIA:  General.   ESTIMATED BLOOD LOSS:  Minimal.   DRAINS:  One Harrison Mons was left.   CLINICAL NOTE:  A 65 year old woman has breast cancer.  She will be  having bilateral mastectomy, including a prophylactic mastectomy as  well.  She is somewhat heavy and it was felt that she should have weight  loss, and because of her weight, delay of the TRAM flap was indicated.  Next, procedure and risks were discussed and she understood and wished  to proceed.   DESCRIPTION OF PROCEDURE:  The patient was in the operating room.  She  had already prepped with Betadine and draped with sterile drapes.  The  incision was made in the crease underlying the panniculus, and  dissection carried down through the subcutaneous tissue with  electrocautery.  Meticulous hemostasis was with the electrocautery,  underlying fascia was identified and a small incision made bilaterally,  at the lateral border of the rectus muscles.  The underlying deep  inferior epigastric vessels identified, triple ligated with clips and  divided.  Excellent hemostasis was confirmed.  Clips in good position.  Irrigation with saline.  The fascial closure with 3-0 Prolene,  interrupted figure-of-eight sutures and the Scarpa closed with 2-0  Monocryl  interrupted inverted deep sutures, subcutaneous and with 2-0  Monocryl interrupted inverted deep dermal sutures, and a running 3-0  Monocryl subcuticular suture completed the closure.  Steri-Strips, dry-  sterile dressing, and an abdominal binder position.  Tolerated well.  Prior to the closure, a Blake drain was positioned in the depth of the  wound and brought through the stab wound inferolaterally and secured  with 3-0 Prolene suture.      Etter Sjogren, M.D.  Electronically Signed    DB/MEDQ  D:  11/03/2006  T:  11/04/2006  Job:  [id number]

## 2011-04-05 NOTE — Consult Note (Signed)
Claudia [name], [name]               ACCOUNT NO.:  [id number]   MEDICAL RECORD NO.:  [id number]          PATIENT TYPE:  INP   LOCATION:  4706                         FACILITY:  [facility]   PHYSICIAN:  Claudia [name], ANP DATE OF BIRTH:  April 24, 1946   DATE OF CONSULTATION:  08/19/2006  DATE OF DISCHARGE:                                   CONSULTATION   The patient  is being seen by Dr. Antoine [name] today.  She thinks she has been  seen by [facility] cardiologist in the past but is not sure as to who.   PATIENT PROFILE:  A 65-year married white female with prior history of  atypical chest pain who presents with atypical chest pain.   PROBLEM LIST:  1. Atypical chest pain.      a.     Prior history of  negative GI and cardiac evals in the past with       a negative stress test approximately 5 years ago or more per patient.  2. Hypertension.  3. Fibromyalgia.  4. GERD.  5. History of left carotid bruit.  6. Anxiety and depression.  7. Irritable bowel syndrome.  8. Diverticulitis.   HISTORY OF PRESENT ILLNESS:  A 65 year old married white female with history  of atypical chest pain dating back approximately 5 years or more with  reported negative Myoview in the past.  She experiences intermittent left  chest and shoulder pain approximately 10 times per year, lasting several  days at a time and resolving spontaneously.  She recently returned from  [place] on a long flight the last week of August and noted significant  lower extremity swelling and pain after getting off the airplane.  Approximately 1 week ago she began to experience constant 3 out of 10 left  chest pain described as stabbing, piercing and grinding with radiation to  the left shoulder, similar in character to what she has experienced in the  past.  Symptoms are worsened with turning of the head to the left as well as  palpation of the left chest and deep breathing.  She has no associated  symptoms such as shortness of  breath, nausea, vomiting or diaphoresis.  Her  discomfort has been constant over the past week.  She saw her primary care  Claudia [name] today and decision was made to admit her secondary to concern over  possible pulmonary embolism  and we have been asked to consult for  additional evaluation of chest pain.  To this point, her ECG shows no acute  changes and cardiac markers are negative x1.  Chest CT was performed and  shows no evidence of pulmonary embolism.  She continues to complain of 3 out  of 10 chest pain.   ALLERGIES:  SULFA.   CURRENT MEDICATIONS:  1. Diovan HCT 320/25 mg daily.  2. Temazepam 15-30 mg p.r.n. q.h.s.  3. Aspirin 325 mg daily.  4. Mobic 7.5-15 mg daily.  5. Protonix 40 mg daily.   FAMILY HISTORY:  Mother is age 13 with a history of lymphoma and diabetes.  Father died in a plane crash at age  27 as he was learning to fly.  She has a  sister who has a history of multiple CVAs and diabetes, another sister who  has a pacemaker and a brother who has a history of diabetes, CAD and CABG.   SOCIAL HISTORY:  She lives in [place] with her husband.  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  She has one grown  daughter.  She smoked an occasional cigarette prior to the age of 28, but  nothing since then.  She denies any alcohol or drugs.  She does not  routinely exercise.   Review of systems positive for chest pain and lower extremity edema as  outlined in the HPI.  She also reports a history of depression, anxiety as  well as an intermittent bouts of diarrhea and constipation.  All other  systems reviewed and negative.   PHYSICAL EXAMINATION:  VITAL SIGNS:  Temperature 97.7, heart rate 59,  respirations 18, blood pressure 122/57, pulse ox 98% in room air.  GENERAL:  Pleasant white female in no acute distress, awake, alert and  oriented x3.  NECK:  Normal carotid upstrokes with a soft left carotid bruit.  No JVD.  LUNGS:  Respirations  free and unlabored.  Clear to auscultation.  CARDIAC:  Regular S1 and S2.  No S3, S4 or murmurs.  ABDOMEN:  Round, soft, nontender, nondistended.  Bowel sounds present x4.  CHEST:  Left upper chest wall is tender to palpation.  The skin in that area  is without any erythema or breakdown.  EXTREMITIES:  Warm, dry, pink.  No clubbing, cyanosis or edema.  Dorsalis  pedis, posterior tibial pulses 2+ and equal bilaterally.  HEENT:  Atraumatic, normocephalic.  NEURO:  Cranial nerves II-XII are grossly intact and strength is 5 out of 5  in all extremities.   ACCESSORY CLINICAL FINDINGS:  Chest CT shows no evidence of pulmonary  embolism with minimal atelectasis.  No significant lung disease and a  sclerotic lesion at C7, (?) atypical hemangioma.  EKG shows sinus  bradycardia with no acute ST-T changes.   LAB WORK:  Hemoglobin 14, hematocrit 41.1, WBC 5.8, platelets 306.  Sodium  141, potassium 3.7, chloride 106, CO2 29, BUN 16, creatinine 1.1, glucose  124, total bilirubin 1.1, alkaline phosphatase 80, AST 26, ALT 27, total  protein 6.5, albumin 3.9, CK 175, mB 1.9, troponin-I less than 0.1. PT 12.9,  INR 1, calcium 9.2 .   ASSESSMENT AND PLAN:  1. Atypical chest pain.  She has a long history of intermittent chest pain      with negative stress test in the past per her report.  Pain has      primarily pleuritic and musculoskeletal features.  It has been constant      to varying degrees over the past week and despite this, her cardiac      markers are negative and ECG is without any acute changes.  Cardiac      markers negative x1  Would recommend continuation of rule out by      cardiac enzymes and we have arranged an outpatient exercise Myoview on      October 8 at 11:45 a.m. to further exclude a cardiac etiology of her      atypical chest pain.  Question if she would benefit from a      rheumatologic/fibromyalgia evaluation as an outpatient. 2. Hypertension, stable.  3. GERD.  Symptoms  are not clearly GER-like as  they are reproducible.      Continue PPI.  4. History of left carotid bruit that has been followed by Dr. Posey [name]      in the outpatient setting.  5. Anxiety and depression.  The patient does report to increased stress      over the past week with the death of a      family member and also raised concern by an aunt regarding a family      history of coronary disease.  She does note that stress has played a      role in worsening of her chest pain symptoms in the past.  Management      per primary team.           ______________________________  Claudia [name], ANP     CB/MEDQ  D:  08/19/2006  T:  08/20/2006  Job:  [id number]   cc:   Georgina Quint. Plotnikov, MD

## 2011-04-05 NOTE — Discharge Summary (Signed)
Claudia, [name]               ACCOUNT NO.:  [id number]   MEDICAL RECORD NO.:  [id number]          PATIENT TYPE:  INP   LOCATION:  4706                         FACILITY:  [facility]   PHYSICIAN:  Valerie A. Felicity Coyer, MDDATE OF BIRTH:  [birth year]   DATE OF ADMISSION:  08/19/2006  DATE OF DISCHARGE:  08/20/2006                                 DISCHARGE SUMMARY   DISCHARGE DIAGNOSES:  1. Atypical chest pain.  2. Fibromyalgia   HISTORY OF PRESENT ILLNESS:  Claudia [name] is a 65 year old white female  admitted on 08/19/2006 with chest pain which was recurrent an atypical in  nature.  She was admitted for her serial cardiac enzymes and a 24-hour  observation.   PAST MEDICAL HISTORY:  1. Depression.  2. Gastroesophageal reflux disease  3  Insomnia.  1. Fibromyalgia.  2. Left carotid bruit.  3. Anxiety/depression.  4. Irritable bowel syndrome.  5. Diverticulitis.   COURSE OF HOSPITALIZATION:  1 - Atypical chest pain.  The patient was  admitted and was evaluated by [facility] cardiology, Rollene Rotunda, MD, and  she underwent serial cardiac enzymes which were negative x3.  CT angio chest  was performed which was negative for PE. It did note dependent atelectasis  in the superior depression of L3 which was thought to be possibly secondary  to a Schmorl's node or a nodal injury.  However, did not appear to be acute.  The patient's oxygen saturation is stable at 97% on room air.  She has been  set up by [facility] for an exercise stress test which will be  performed on 08/25/2006 as well as follow up with Dr. Antoine Poche October 16.  The patient's chest pain appears to be musculoskeletal, may be related to  her fibromyalgia.  She will be given a small prescription for Vicodin p.r.n.   PERTINENT LABORATORY DATA:  Discharge cardiac enzymes negative x3.  BUN 16,  creatinine 1.1, hemoglobin of 14, hematocrit 41.1.   MEDICATIONS AT DISCHARGE:  1. Diovan 300/25 1 tablet p.o. daily.  2.  Nexium 40 mg p.o. b.i.d.  3. Mobic. The patient is to continue same dose as prior to admission.  4. Aspirin 325 mg p.o. daily.  5. Vicodin 5/500 1 tablet p.o. q.4 h as needed.   DISPOSITION:  Plan transfer the patient to home.   FOLLOW UP:  The patient instructed follow up on 08/25/2006 11:45 a.m. at  [facility] for an exercise stress test and to follow up with Dr.  Antoine Poche October 16 at 9:15 a.m. She is also instructed to follow up with  Dr. Posey Rea in 1 week and contact his office for an appointment.  She is  instructed to return to the ER should she develop any worsening chest pain.     ______________________________  Sandford Craze, NP      Raenette Rover. Felicity Coyer, MD  Electronically Signed    MO/MEDQ  D:  08/20/2006  T:  08/21/2006  Job:  [id number]   cc:   Georgina Quint. Plotnikov, MD

## 2011-04-05 NOTE — Assessment & Plan Note (Signed)
[facility]                              CARDIOLOGY OFFICE NOTE   NAME:Claudia [name], Claudia [name]                      MRN:          [id number]  DATE:09/02/2006                            DOB:          05/05/1946    PRIMARY CARE PHYSICIAN:  Georgina Quint. Plotnikov, MD.   REASON FOR PRESENTATION:  Evaluate patient with chest pain.   HISTORY OF PRESENT ILLNESS:  The patient was hospitalized earlier this month  with chest discomfort.  She had had a long plane trip, and so she had a  workup to exclude a pulmonary embolism, DVT.  She subsequently ruled out for  myocardial infarction.  We did send her for a stress perfusion study because  of her cardiovascular risk factors.  She was found to have an EF of 78% with  no evidence of ischemia or infarct.  She now presents, and she is quite  tearful because she continues to have pain.  She has pain when she moves her  head, pain when she takes a deep breath, pain at rest or with exertion.  It  is left-sided and sharp, and radiates through to her back.  There has been  no change in this pattern.   PAST MEDICAL HISTORY:  1. Anxiety/depression.  2. Irritable bowel syndrome.  3. Diverticulitis.  4. Fibromyalgia.  5. Insomnia.  6. Gastroesophageal reflux disease.   ALLERGIES:  SULFA, LOVASTATIN cause myalgias.   MEDICATIONS:  1. Nexium 40 mg [name].i.d.  2. Niaspan.  3. Diovan 320/25 mg daily.  4. Meloxicam.  5. Multivitamin.  6. Calcium.   REVIEW OF SYSTEMS:  As stated in the HPI, negative for other pertinent  systems.   PHYSICAL EXAMINATION:  The patient is in no distress, though tearful.  Blood  pressure 124/75, heart rate is 73 and regular, weight 216 pounds.  NECK:  No jugular venous distention at 45 degrees, carotid upstroke brisk  and symmetric.  LUNGS:  Clear to auscultation bilaterally.  HEART:  PMI not displaced or sustained, S1 and S2 within normal limits, no  S3, no S4, no murmurs.  ABDOMEN:  Flat,  positive bowel sounds, normal in frequency and pitch, no  bruits, no rebound, no guarding, no midline pulsatile mass, no organomegaly.  SKIN:  No rashes, no nodules.  EXTREMITIES:  2+ pulses, no edema.   EKG sinus rhythm, rate 70, axis within normal limits, intervals within  normal limits, no QS or T wave changes.   ASSESSMENT AND PLAN:  1. Chest discomfort.  The patient's chest discomfort is atypical.  There      has been no objective evidence of ischemia, and a negative stress      perfusion study.  No further cardiovascular testing is suggested.  She      can continue with primary risk reduction.  2. Followup.  The patient can come back as needed.  I am going to help her      schedule an appointment with Dr. Posey Rea to discuss further      evaluation of her chest pain.  ______________________________  Rollene Rotunda, MD, [facility]     JH/MedQ  DD:  09/02/2006  DT:  09/03/2006  Job #:  [id number]   cc:   Georgina Quint. Plotnikov, MD

## 2011-04-05 NOTE — Op Note (Signed)
NAMEJANEAH, KOVACICH               ACCOUNT NO.:  [id number]   MEDICAL RECORD NO.:  [id number]          PATIENT TYPE:  OIB   LOCATION:  5702                         FACILITY:  [facility]   PHYSICIAN:  Currie Paris, M.D.DATE OF BIRTH:  Jun 03, 1946   DATE OF PROCEDURE:  11/03/2006  DATE OF DISCHARGE:                               OPERATIVE REPORT   PREOPERATIVE DIAGNOSES:  1. 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  2. Left breast calcifications of uncertain etiology.   POSTOPERATIVE DIAGNOSES:  1. 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  2. Left breast calcifications of uncertain etiology.   OPERATION:  1. Right total mastectomy with blue dye injection and sentinel lymph      node biopsy (1 node).  2. Left total mastectomy.   SURGEON:  Currie Paris, M.D.   ASSISTANT:  Francina Ames, M.D.   ANESTHESIA:  General endotracheal.   CLINICAL HISTORY:  Ms. Crothers is a 65 year old woman who has been found  to have a fairly diffuse area of DCIS in her right breast, as manifested  by of calcifications and a biopsy of DCIS.  There were some  abnormalities found in the left breast which were initially scheduled  for biopsy, but the patient elected to have a prophylactic left  mastectomy done along with TRAM flap reconstruction, with the plans for  a delayed TRAM being performed today and the final reconstruction in  about 3 months.   DESCRIPTION:  The patient was seen in the holding area and she had no  further questions.  We confirmed that the right breast was the site of  the breast cancer and therefore the side for the sentinel lymph node  biopsy, with plans for a left total mastectomy.  Unfortunately, prior to  my arrival, the patient had had the left breast injected with her  radioisotope.  This was reviewed with the patient and she understood  that the incorrect side had been injected and we planned for a  subsequent injection of the right side.  The  patient had no further  questions.   The patient was then taken to the operating room and after satisfactory  general endotracheal anesthesia had been obtained, both breasts and the  abdomen were prepped and draped as a single sterile field.  I initially  injected some methylene blue (2 mL of methylene blue diluted with 3 mL  of injectable saline) circumareolarly.  I began doing the prophylactic  left mastectomy first.  I made an elliptical incision and began by  raising a skin flap superiorly to the clavicle, medially to the sternum  and inferior to the inframammary fold.  At about this point, nuclear  medicine technician arrived with additional technetium.  I then, using  separate gloves, was able to inject the right breast with 1 mCi of  filtered technetium sulfur colloid.  This was done intradermally in 4  sites.   The left mastectomy was then completed by finishing the flap out  laterally to the latissimus.  The breast was removed from the underlying  muscle using cautery.  The axilla  proper was not entered.   I spent several minutes making sure everything was dry and then placed  some moist packs.  Attention was then turned to the right side.  Using  the Neoprobe, I identified a hot area in the right axilla and marked the  overlying skin.  I then made an elliptical incision and raised a  superior skin flap identical to that on the left side.  As we got  towards the axilla and using the Neoprobe, I did find a hot lymph node;  that was sent for touch preps and was subsequently reported negative.  I  did not see any blue dye entering and no lymph nodes were identified.  There were no palpably abnormal nodes noted.   I then completed the inferior flap and removed the right breast with the  cautery.  This wound was copiously irrigated and hemostasis achieved.  We then placed 4 Blake drains, 2 on each side, and secured them with 2-0  silks.  A final check was made for hemostasis and  the breast incisions  closed bilaterally with skin staples.   While I was performing my portion of the procedure, Dr. Odis Luster scrubbed  in and did the delayed TRAM on the abdomen simultaneously.  This was  dictated separately by him.   At the completion of the case, the patient was awakened to be taken to  the recovery room in satisfactory condition.  There were no operative  complications.  All counts were correct.      Currie Paris, M.D.     CJS/MEDQ  D:  11/03/2006  T:  11/04/2006  Job:  [id number]   cc:   Georgina Quint. Plotnikov, MD  Emilee Hero MD  Etter Sjogren, M.D.

## 2011-04-10 ENCOUNTER — Encounter: Payer: Self-pay | Admitting: Internal Medicine

## 2011-04-10 ENCOUNTER — Ambulatory Visit (INDEPENDENT_AMBULATORY_CARE_PROVIDER_SITE_OTHER): Payer: Medicare Other | Admitting: Internal Medicine

## 2011-04-10 DIAGNOSIS — R635 Abnormal weight gain: Secondary | ICD-10-CM

## 2011-04-10 DIAGNOSIS — I10 Essential (primary) hypertension: Secondary | ICD-10-CM

## 2011-04-10 DIAGNOSIS — E538 Deficiency of other specified B group vitamins: Secondary | ICD-10-CM

## 2011-04-10 MED ORDER — DULOXETINE HCL 60 MG PO CPEP: 60.0000 mg | ORAL_CAPSULE | Freq: Every day | ORAL | Status: DC

## 2011-04-10 MED ORDER — LORATADINE 10 MG PO TABS: 10.0000 mg | ORAL_TABLET | Freq: Every day | ORAL | Status: DC

## 2011-04-10 MED ORDER — GABAPENTIN 600 MG PO TABS: 600.0000 mg | ORAL_TABLET | Freq: Three times a day (TID) | ORAL | Status: DC

## 2011-04-10 MED ORDER — TAMOXIFEN CITRATE 20 MG PO TABS: 20.0000 mg | ORAL_TABLET | Freq: Every day | ORAL | Status: DC

## 2011-04-10 MED ORDER — MELOXICAM 15 MG PO TABS: 15.0000 mg | ORAL_TABLET | Freq: Every day | ORAL | Status: DC

## 2011-04-10 MED ORDER — ESOMEPRAZOLE MAGNESIUM 40 MG PO CPDR: 40.0000 mg | DELAYED_RELEASE_CAPSULE | Freq: Two times a day (BID) | ORAL | Status: DC

## 2011-04-10 MED ORDER — LOSARTAN POTASSIUM 100 MG PO TABS: 100.0000 mg | ORAL_TABLET | Freq: Every day | ORAL | Status: DC

## 2011-04-10 NOTE — Progress Notes (Signed)
  Subjective:    Patient ID: Claudia [name], female    DOB: September 26, 1946, 65 y.o.   MRN: [id number]  HPI   The patient is here to follow up on chronic depression, anxiety, headaches and chronic moderate fibromyalgia symptoms not controlled to well with medicines, diet and exercise. Better on Cymbalta C/o Wt loss  Review of Systems  Constitutional: Positive for unexpected weight change.  HENT: Negative for nosebleeds and mouth sores.   Respiratory: Negative for chest tightness.   Gastrointestinal: Negative for abdominal pain.  Genitourinary: Negative for flank pain.  Musculoskeletal: Negative for joint swelling and gait problem.  Skin: Negative for pallor and rash.  Neurological: Negative for light-headedness.  Psychiatric/Behavioral: Positive for sleep disturbance. Negative for suicidal ideas and confusion. The patient is nervous/anxious.        Objective:   Physical Exam  Constitutional: She appears well-developed and well-nourished. No distress.  HENT:  Head: Normocephalic.  Right Ear: External ear normal.  Left Ear: External ear normal.  Nose: Nose normal.  Mouth/Throat: Oropharynx is clear and moist.  Eyes: Conjunctivae are normal. Pupils are equal, round, and reactive to light. Right eye exhibits no discharge. Left eye exhibits no discharge.  Neck: Normal range of motion. Neck supple. No JVD present. No tracheal deviation present. No thyromegaly present.  Cardiovascular: Normal rate, regular rhythm and normal heart sounds.   Pulmonary/Chest: No stridor. No respiratory distress. She has no wheezes.  Abdominal: Soft. Bowel sounds are normal. She exhibits no distension and no mass. There is no tenderness. There is no rebound and no guarding.  Musculoskeletal: She exhibits no edema and no tenderness.  Lymphadenopathy:    She has no cervical adenopathy.  Neurological: She displays normal reflexes. No cranial nerve deficit. She exhibits normal muscle tone. Coordination normal.    Skin: No rash noted. No erythema.  Psychiatric: Her behavior is normal. Judgment and thought content normal.       anxious       Lab Results  Component Value Date   WBC 5.8 10/17/2010   HGB 13.4 12/27/2010   HCT 40.1 12/27/2010   PLT 199 12/27/2010   CHOL 158 05/21/2007   TRIG 118 05/21/2007   HDL 32.8* 05/21/2007   ALT 10 12/27/2010   AST 20 12/27/2010   NA 141 12/27/2010   K 4.2 12/27/2010   CL 105 12/27/2010   CREATININE 1.01 12/27/2010   BUN 21 12/27/2010   CO2 27 12/27/2010   TSH 2.32 10/17/2010      Assessment & Plan:  B12 DEFICIENCY Cont Rx  HYPERTENSION On Rx    Cramps Discussed  Depression  On Rx See Meds

## 2011-04-10 NOTE — Assessment & Plan Note (Signed)
Cont Rx 

## 2011-04-10 NOTE — Assessment & Plan Note (Signed)
On Rx 

## 2011-04-15 ENCOUNTER — Encounter: Payer: Self-pay | Admitting: Internal Medicine

## 2011-07-16 ENCOUNTER — Ambulatory Visit: Payer: Medicare Other | Admitting: Internal Medicine

## 2011-08-02 ENCOUNTER — Ambulatory Visit: Payer: Medicare Other | Admitting: Internal Medicine

## 2011-08-20 ENCOUNTER — Encounter: Payer: Self-pay | Admitting: Internal Medicine

## 2011-08-20 ENCOUNTER — Ambulatory Visit (INDEPENDENT_AMBULATORY_CARE_PROVIDER_SITE_OTHER): Payer: Medicare Other | Admitting: Internal Medicine

## 2011-08-20 ENCOUNTER — Other Ambulatory Visit (INDEPENDENT_AMBULATORY_CARE_PROVIDER_SITE_OTHER): Payer: Medicare Other

## 2011-08-20 VITALS — BP 110/70 | HR 84 | Temp 97.9°F | Resp 16 | Wt 181.0 lb

## 2011-08-20 DIAGNOSIS — IMO0001 Reserved for inherently not codable concepts without codable children: Secondary | ICD-10-CM

## 2011-08-20 DIAGNOSIS — I10 Essential (primary) hypertension: Secondary | ICD-10-CM

## 2011-08-20 DIAGNOSIS — Z23 Encounter for immunization: Secondary | ICD-10-CM

## 2011-08-20 DIAGNOSIS — F329 Major depressive disorder, single episode, unspecified: Secondary | ICD-10-CM

## 2011-08-20 DIAGNOSIS — E538 Deficiency of other specified B group vitamins: Secondary | ICD-10-CM

## 2011-08-20 DIAGNOSIS — F3289 Other specified depressive episodes: Secondary | ICD-10-CM

## 2011-08-20 DIAGNOSIS — Z853 Personal history of malignant neoplasm of breast: Secondary | ICD-10-CM

## 2011-08-20 LAB — COMPREHENSIVE METABOLIC PANEL
ALT: 19 U/L (ref 0–35)
CO2: 29 mEq/L (ref 19–32)
Calcium: 8.8 mg/dL (ref 8.4–10.5)
Chloride: 107 mEq/L (ref 96–112)
GFR: 54.26 mL/min — ABNORMAL LOW (ref >60.00)
Glucose, Bld: 109 mg/dL — ABNORMAL HIGH (ref 70–99)
Sodium: 143 mEq/L (ref 135–145)
Total Protein: 6.6 g/dL (ref 6.0–8.3)

## 2011-08-20 LAB — CK: Total CK: 102 U/L (ref 7–177)

## 2011-08-20 LAB — SEDIMENTATION RATE: Sed Rate: 9 mm/hr (ref 0–22)

## 2011-08-20 MED ORDER — CYANOCOBALAMIN 1000 MCG/ML IJ SOLN: 1000.0000 ug | INTRAMUSCULAR | Status: DC

## 2011-08-20 MED ORDER — TAMOXIFEN CITRATE 20 MG PO TABS: 20.0000 mg | ORAL_TABLET | Freq: Every day | ORAL | Status: DC

## 2011-08-20 MED ORDER — MELOXICAM 15 MG PO TABS: 15.0000 mg | ORAL_TABLET | Freq: Every day | ORAL | Status: DC

## 2011-08-20 MED ORDER — GABAPENTIN 600 MG PO TABS: 600.0000 mg | ORAL_TABLET | Freq: Three times a day (TID) | ORAL | Status: DC

## 2011-08-20 MED ORDER — DULOXETINE HCL 60 MG PO CPEP: 60.0000 mg | ORAL_CAPSULE | Freq: Every day | ORAL | Status: DC

## 2011-08-20 MED ORDER — LOSARTAN POTASSIUM 100 MG PO TABS: 100.0000 mg | ORAL_TABLET | Freq: Every day | ORAL | Status: DC

## 2011-08-20 MED ORDER — ESOMEPRAZOLE MAGNESIUM 40 MG PO CPDR: 40.0000 mg | DELAYED_RELEASE_CAPSULE | Freq: Two times a day (BID) | ORAL | Status: DC

## 2011-08-20 MED ORDER — LORATADINE 10 MG PO TABS: 10.0000 mg | ORAL_TABLET | Freq: Every day | ORAL | Status: DC

## 2011-08-20 NOTE — Assessment & Plan Note (Signed)
Continue with current prescription therapy as reflected on the Med list.  

## 2011-08-20 NOTE — Progress Notes (Signed)
  Subjective:    Patient ID: Claudia [name], female    DOB: [birth year], 65 y.o.   MRN: [id number]  HPI   The patient is here to follow up on chronic depression, anxiety, headaches and chronic moderate fibromyalgia symptoms controlled with medicines, diet and exercise. Wt Readings from Last 3 Encounters:  08/20/11 181 lb (82.101 kg)  04/10/11 179 lb (81.194 kg)  10/17/10 164 lb (74.39 kg)     Review of Systems  Constitutional: Negative for chills, activity change, appetite change, fatigue and unexpected weight change.  HENT: Negative for congestion, mouth sores and sinus pressure.   Eyes: Negative for visual disturbance.  Respiratory: Negative for cough and chest tightness.   Gastrointestinal: Negative for nausea and abdominal pain.  Genitourinary: Negative for frequency, difficulty urinating and vaginal pain.  Musculoskeletal: Negative for back pain and gait problem.  Skin: Negative for pallor and rash.  Neurological: Negative for dizziness, tremors, weakness, numbness and headaches.  Psychiatric/Behavioral: Negative for suicidal ideas, confusion and sleep disturbance.       Objective:   Physical Exam  Constitutional: She appears well-developed. No distress.       A little obese  HENT:  Head: Normocephalic.  Right Ear: External ear normal.  Left Ear: External ear normal.  Nose: Nose normal.  Mouth/Throat: Oropharynx is clear and moist.  Eyes: Conjunctivae are normal. Pupils are equal, round, and reactive to light. Right eye exhibits no discharge. Left eye exhibits no discharge.  Neck: Normal range of motion. Neck supple. No JVD present. No tracheal deviation present. No thyromegaly present.  Cardiovascular: Normal rate, regular rhythm and normal heart sounds.   Pulmonary/Chest: No stridor. No respiratory distress. She has no wheezes.  Abdominal: Soft. Bowel sounds are normal. She exhibits no distension and no mass. There is no tenderness. There is no rebound and no guarding.   Musculoskeletal: She exhibits no edema and no tenderness.  Lymphadenopathy:    She has no cervical adenopathy.  Neurological: She displays normal reflexes. No cranial nerve deficit. She exhibits normal muscle tone. Coordination normal.  Skin: No rash noted. No erythema.  Psychiatric: She has a normal mood and affect. Her behavior is normal. Judgment and thought content normal.          Assessment & Plan:

## 2011-08-20 NOTE — Patient Instructions (Signed)
Wt Readings from Last 3 Encounters:  08/20/11 181 lb (82.101 kg)  04/10/11 179 lb (81.194 kg)  10/17/10 164 lb (74.39 kg)

## 2011-08-21 ENCOUNTER — Telehealth: Payer: Self-pay | Admitting: Internal Medicine

## 2011-08-21 NOTE — Telephone Encounter (Signed)
[name], please, inform patient that all labs are normal Thx 

## 2011-08-21 NOTE — Telephone Encounter (Signed)
Left detailed mess informing pt of below.  

## 2011-08-23 ENCOUNTER — Encounter: Payer: Self-pay | Admitting: Internal Medicine

## 2011-09-02 LAB — URINALYSIS, ROUTINE W REFLEX MICROSCOPIC
Bilirubin Urine: NEGATIVE
Glucose, UA: NEGATIVE
Hgb urine dipstick: NEGATIVE
Ketones, ur: NEGATIVE
Protein, ur: NEGATIVE
Urobilinogen, UA: 0.2

## 2011-09-02 LAB — CBC
MCHC: 34.3
MCV: 88.9
RBC: 3.29 — ABNORMAL LOW
RDW: 13.5

## 2011-09-02 LAB — BASIC METABOLIC PANEL
BUN: 16
CO2: 28
Chloride: 105
Creatinine, Ser: 1.04
Glucose, Bld: 113 — ABNORMAL HIGH

## 2011-09-03 ENCOUNTER — Telehealth: Payer: Self-pay | Admitting: *Deleted

## 2011-09-03 NOTE — Telephone Encounter (Signed)
Pt called and states she has her yearly sinus infection. Pt states she has symptoms of runny nose, head congestion, thick mucous coming from her nose (yellow green in color), no fever. Pt states she gets these every year and Dr Macario Golds usually sends in a medication for her. Pt states she doesn't have time to come in (office visit was offered) what do you advise for pt?

## 2011-09-03 NOTE — Telephone Encounter (Signed)
Informed pt .

## 2011-09-03 NOTE — Telephone Encounter (Signed)
For her symptoms she can take antihistamine, e.g. Claritin, sudafed 30 mg tid, mucinex 1200 mg bid. No telephone antibiotics - especially since her symptoms do not suggest a bacterial infection. For fever or unrelieved symptoms she will need OV

## 2011-10-23 ENCOUNTER — Other Ambulatory Visit (INDEPENDENT_AMBULATORY_CARE_PROVIDER_SITE_OTHER): Payer: Medicare Other

## 2011-10-23 ENCOUNTER — Encounter: Payer: Self-pay | Admitting: Internal Medicine

## 2011-10-23 ENCOUNTER — Ambulatory Visit (INDEPENDENT_AMBULATORY_CARE_PROVIDER_SITE_OTHER): Payer: Medicare Other | Admitting: Internal Medicine

## 2011-10-23 ENCOUNTER — Other Ambulatory Visit: Payer: Self-pay | Admitting: Internal Medicine

## 2011-10-23 ENCOUNTER — Telehealth: Payer: Self-pay | Admitting: Internal Medicine

## 2011-10-23 VITALS — BP 122/70 | HR 80 | Temp 97.9°F | Resp 16 | Wt 185.0 lb

## 2011-10-23 DIAGNOSIS — IMO0001 Reserved for inherently not codable concepts without codable children: Secondary | ICD-10-CM

## 2011-10-23 DIAGNOSIS — Z136 Encounter for screening for cardiovascular disorders: Secondary | ICD-10-CM

## 2011-10-23 DIAGNOSIS — R634 Abnormal weight loss: Secondary | ICD-10-CM

## 2011-10-23 DIAGNOSIS — G4700 Insomnia, unspecified: Secondary | ICD-10-CM

## 2011-10-23 DIAGNOSIS — M26649 Arthritis of unspecified temporomandibular joint: Secondary | ICD-10-CM

## 2011-10-23 DIAGNOSIS — E538 Deficiency of other specified B group vitamins: Secondary | ICD-10-CM

## 2011-10-23 DIAGNOSIS — I10 Essential (primary) hypertension: Secondary | ICD-10-CM

## 2011-10-23 DIAGNOSIS — M2669 Other specified disorders of temporomandibular joint: Secondary | ICD-10-CM

## 2011-10-23 DIAGNOSIS — Z23 Encounter for immunization: Secondary | ICD-10-CM

## 2011-10-23 DIAGNOSIS — Z0000 Encounter for general adult medical examination without abnormal findings: Secondary | ICD-10-CM

## 2011-10-23 DIAGNOSIS — IMO0002 Reserved for concepts with insufficient information to code with codable children: Secondary | ICD-10-CM

## 2011-10-23 LAB — CBC WITH DIFFERENTIAL/PLATELET
Basophils Relative: 0.1 % (ref 0.0–3.0)
Eosinophils Absolute: 0.1 10*3/uL (ref 0.0–0.7)
Eosinophils Relative: 2.1 % (ref 0.0–5.0)
Lymphocytes Relative: 19.1 % (ref 12.0–46.0)
Neutrophils Relative %: 71.9 % (ref 43.0–77.0)
Platelets: 202 10*3/uL (ref 150.0–400.0)
RBC: 4.35 Mil/uL (ref 3.87–5.11)
WBC: 5.9 10*3/uL (ref 4.5–10.5)

## 2011-10-23 LAB — HEPATIC FUNCTION PANEL
ALT: 19 U/L (ref 0–35)
Albumin: 3.9 g/dL (ref 3.5–5.2)
Bilirubin, Direct: 0.1 mg/dL (ref 0.0–0.3)
Total Protein: 6.8 g/dL (ref 6.0–8.3)

## 2011-10-23 LAB — BASIC METABOLIC PANEL
BUN: 17 mg/dL (ref 6–23)
Chloride: 106 mEq/L (ref 96–112)
GFR: 67.08 mL/min (ref >60.00)
Glucose, Bld: 101 mg/dL — ABNORMAL HIGH (ref 70–99)
Potassium: 4.1 mEq/L (ref 3.5–5.1)
Sodium: 140 mEq/L (ref 135–145)

## 2011-10-23 LAB — URINALYSIS, ROUTINE W REFLEX MICROSCOPIC
Leukocytes, UA: NEGATIVE
Nitrite: NEGATIVE
Specific Gravity, Urine: 1.02 (ref 1.000–1.030)
Urine Glucose: NEGATIVE
Urobilinogen, UA: 0.2 (ref 0.0–1.0)

## 2011-10-23 LAB — LIPID PANEL
Cholesterol: 188 mg/dL (ref 0–200)
HDL: 51.8 mg/dL (ref >39.00)
LDL Cholesterol: 117 mg/dL — ABNORMAL HIGH (ref 0–99)
VLDL: 18.8 mg/dL (ref 0.0–40.0)

## 2011-10-23 MED ORDER — LOSARTAN POTASSIUM 100 MG PO TABS: 100.0000 mg | ORAL_TABLET | Freq: Every day | ORAL | Status: DC

## 2011-10-23 MED ORDER — TAMOXIFEN CITRATE 20 MG PO TABS: 20.0000 mg | ORAL_TABLET | Freq: Every day | ORAL | Status: DC

## 2011-10-23 MED ORDER — ESOMEPRAZOLE MAGNESIUM 40 MG PO CPDR: 40.0000 mg | DELAYED_RELEASE_CAPSULE | Freq: Two times a day (BID) | ORAL | Status: DC

## 2011-10-23 MED ORDER — GABAPENTIN 600 MG PO TABS: 600.0000 mg | ORAL_TABLET | Freq: Three times a day (TID) | ORAL | Status: DC

## 2011-10-23 MED ORDER — MELOXICAM 15 MG PO TABS: 15.0000 mg | ORAL_TABLET | Freq: Every day | ORAL | Status: DC

## 2011-10-23 MED ORDER — DULOXETINE HCL 60 MG PO CPEP: 60.0000 mg | ORAL_CAPSULE | Freq: Every day | ORAL | Status: DC

## 2011-10-23 NOTE — Assessment & Plan Note (Signed)
Wt Readings from Last 3 Encounters:  10/23/11 185 lb (83.915 kg)  08/20/11 181 lb (82.101 kg)  04/10/11 179 lb (81.194 kg)

## 2011-10-23 NOTE — Assessment & Plan Note (Signed)
Continue with current prescription therapy as reflected on the Med list.  

## 2011-10-23 NOTE — Progress Notes (Signed)
  Subjective:    Patient ID: Claudia [name], female    DOB: Jan 01, 1946, 65 y.o.   MRN: [id number]  HPI The patient is here for a wellness exam. The patient has been doing well overall without major physical or psychological issues going on lately. The patient needs to address  chronic hypertension that has been well controlled with medicines; to address chronic  hyperlipidemia controlled with medicines as well; and to address anxiety, controlled with medical treatment and diet. C/o TMJ    Review of Systems  Constitutional: Negative for fever, chills, diaphoresis, activity change, appetite change, fatigue and unexpected weight change.  HENT: Negative for hearing loss, ear pain, congestion, sore throat, sneezing, mouth sores, neck pain, dental problem, voice change, postnasal drip and sinus pressure.   Eyes: Negative for pain and visual disturbance.  Respiratory: Negative for cough, chest tightness, wheezing and stridor.   Cardiovascular: Negative for chest pain, palpitations and leg swelling.  Gastrointestinal: Negative for nausea, vomiting, abdominal pain, blood in stool, abdominal distention and rectal pain.  Genitourinary: Negative for dysuria, hematuria, decreased urine volume, vaginal bleeding, vaginal discharge, difficulty urinating, vaginal pain and menstrual problem.  Musculoskeletal: Negative for back pain, joint swelling and gait problem.  Skin: Negative for color change, rash and wound.  Neurological: Negative for dizziness, tremors, syncope, speech difficulty and light-headedness.  Hematological: Negative for adenopathy.  Psychiatric/Behavioral: Negative for suicidal ideas, hallucinations, behavioral problems, confusion, sleep disturbance, dysphoric mood and decreased concentration. The patient is nervous/anxious. The patient is not hyperactive.        Objective:   Physical Exam  Constitutional: She appears well-developed and well-nourished. No distress.  HENT:  Head:  Normocephalic.  Right Ear: External ear normal.  Left Ear: External ear normal.  Nose: Nose normal.  Mouth/Throat: Oropharynx is clear and moist.  Eyes: Conjunctivae are normal. Pupils are equal, round, and reactive to light. Right eye exhibits no discharge. Left eye exhibits no discharge.  Neck: Normal range of motion. Neck supple. No JVD present. No tracheal deviation present. No thyromegaly present.  Cardiovascular: Normal rate, regular rhythm and normal heart sounds.   Pulmonary/Chest: No stridor. No respiratory distress. She has no wheezes.  Abdominal: Soft. Bowel sounds are normal. She exhibits no distension and no mass. There is no tenderness. There is no rebound and no guarding.  Musculoskeletal: She exhibits no edema and no tenderness.       TMJ is tender on R  Lymphadenopathy:    She has no cervical adenopathy.  Neurological: She displays normal reflexes. No cranial nerve deficit. She exhibits normal muscle tone. Coordination normal.  Skin: No rash noted. No erythema.  Psychiatric: Her behavior is normal. Judgment and thought content normal.       anxious          Assessment & Plan:

## 2011-10-23 NOTE — Telephone Encounter (Signed)
[name], please, inform patient that all labs are ok Thx 

## 2011-10-23 NOTE — Assessment & Plan Note (Signed)
The patient is here for annual Medicare wellness examination and management of other chronic and acute problems.   The risk factors are reflected in the social history.  The roster of all physicians providing medical care to patient - is listed in the Snapshot section of the chart.  Activities of daily living:  The patient is 100% inedpendent in all ADLs: dressing, toileting, feeding as well as independent mobility  Home safety : The patient has smoke detectors in the home. They wear seatbelts.No firearms at home ( firearms are present in the home, kept in a safe fashion). There is no violence in the home.   There is no risks for hepatitis, STDs or HIV. There is no   history of blood transfusion. They have no travel history to infectious disease endemic areas of the world.  The patient has (has not) seen their dentist in the last six month. They have (not) seen their eye doctor in the last year. They deny (admit to) any hearing difficulty and have not had audiologic testing in the last year.  They do not  have excessive sun exposure. Discussed the need for sun protection: hats, long sleeves and use of sunscreen if there is significant sun exposure.   Diet: the importance of a healthy diet is discussed. They do have a healthy (unhealthy-high fat/fast food) diet.  The patient has a regular exercise program: walking 2-3 per week.  The benefits of regular aerobic exercise were discussed.  Depression screen: there are no signs or vegative symptoms of depression- irritability, change in appetite, anhedonia, sadness/tearfullness.  Cognitive assessment: the patient manages all their financial and personal affairs and is actively engaged. They could relate day,date,year and events; recalled 3/3 objects at 3 minutes; performed clock-face test normally.  The following portions of the patient's history were reviewed and updated as appropriate: allergies, current medications, past family history, past  medical history,  past surgical history, past social history  and problem list.  Vision, hearing, body mass index were assessed and reviewed.   During the course of the visit the patient was educated and counseled about appropriate screening and preventive services including : fall prevention , diabetes screening, nutrition counseling, colorectal cancer screening, and recommended immunizations.   

## 2011-10-24 NOTE — Telephone Encounter (Signed)
Pt informed

## 2011-11-28 ENCOUNTER — Ambulatory Visit: Payer: Medicare Other | Admitting: Internal Medicine

## 2011-12-07 ENCOUNTER — Telehealth: Payer: Self-pay | Admitting: Oncology

## 2011-12-07 NOTE — Telephone Encounter (Signed)
S/w the pt regarding her appt d/t change for feb 2013

## 2011-12-30 ENCOUNTER — Other Ambulatory Visit (HOSPITAL_BASED_OUTPATIENT_CLINIC_OR_DEPARTMENT_OTHER): Payer: Medicare Other | Admitting: Lab

## 2011-12-30 ENCOUNTER — Other Ambulatory Visit: Payer: Medicare Other

## 2011-12-30 ENCOUNTER — Ambulatory Visit: Payer: Medicare Other | Admitting: Oncology

## 2011-12-30 ENCOUNTER — Ambulatory Visit (HOSPITAL_BASED_OUTPATIENT_CLINIC_OR_DEPARTMENT_OTHER): Payer: Medicare Other | Admitting: Oncology

## 2011-12-30 VITALS — BP 138/68 | HR 77 | Temp 98.6°F | Wt 184.3 lb

## 2011-12-30 DIAGNOSIS — Z853 Personal history of malignant neoplasm of breast: Secondary | ICD-10-CM

## 2011-12-30 DIAGNOSIS — Z170 Estrogen receptor positive status [ER+]: Secondary | ICD-10-CM

## 2011-12-30 DIAGNOSIS — C50919 Malignant neoplasm of unspecified site of unspecified female breast: Secondary | ICD-10-CM

## 2011-12-30 DIAGNOSIS — Z79810 Long term (current) use of selective estrogen receptor modulators (SERMs): Secondary | ICD-10-CM

## 2011-12-30 LAB — CBC WITH DIFFERENTIAL/PLATELET
BASO%: 0.4 % (ref 0.0–2.0)
Basophils Absolute: 0 10*3/uL (ref 0.0–0.1)
EOS%: 2.9 % (ref 0.0–7.0)
HCT: 40.9 % (ref 34.8–46.6)
HGB: 13.9 g/dL (ref 11.6–15.9)
LYMPH%: 28.5 % (ref 14.0–49.7)
MCH: 31.3 pg (ref 25.1–34.0)
MCHC: 34 g/dL (ref 31.5–36.0)
MCV: 92.3 fL (ref 79.5–101.0)
NEUT%: 63.9 % (ref 38.4–76.8)
Platelets: 186 10*3/uL (ref 145–400)
lymph#: 1.5 10*3/uL (ref 0.9–3.3)

## 2011-12-30 LAB — COMPREHENSIVE METABOLIC PANEL
ALT: 16 U/L (ref 0–35)
CO2: 26 mEq/L (ref 19–32)
Calcium: 9.1 mg/dL (ref 8.4–10.5)
Chloride: 105 mEq/L (ref 96–112)
Glucose, Bld: 163 mg/dL — ABNORMAL HIGH (ref 70–99)
Sodium: 141 mEq/L (ref 135–145)
Total Protein: 6.4 g/dL (ref 6.0–8.3)

## 2011-12-30 NOTE — Progress Notes (Signed)
Claudia [name]  DOB: 1946-09-11  MR#: [id number]  CSN#: [id number]   Interval History:   Shelly Rubenstein is doing "much better". She attributes this to her antidepressants. From our point of view she is tolerating the tamoxifen well. The only side effect she has had from this medication are mild hot flashes.  ROS:  She reports no new symptoms. She continues to have insomnia, epistaxis, urinary leakage, stinging in the right breast, which has not more frequent or more intense, forgetfulness, and anxiety and depression issues. Overall a detailed review of systems today was stable  Allergies  Allergen Reactions  . Bupropion Hcl     REACTION: CP  . Lovastatin     REACTION: myalgial  . Olanzapine-Fluoxetine Hcl     REACTION: Leg swelling  . Sodium Lauryl Sulfate   . Sulfonamide Derivatives     Current Outpatient Prescriptions  Medication Sig Dispense Refill  . cholecalciferol (VITAMIN D) 1000 UNITS tablet Take 2,000 Units by mouth daily.        . cyanocobalamin (,VITAMIN B-12,) 1000 MCG/ML injection Inject 1 mL (1,000 mcg total) into the muscle every 30 (thirty) days. IM every 2 weeks to 1 month  10 mL  3  . DULoxetine (CYMBALTA) 60 MG capsule Take 1 capsule (60 mg total) by mouth daily. For depression   90 capsule  3  . esomeprazole (NEXIUM) 40 MG capsule Take 1 capsule (40 mg total) by mouth 2 (two) times daily.  180 capsule  3  . gabapentin (NEURONTIN) 600 MG tablet Take 1 tablet (600 mg total) by mouth 3 (three) times daily.  270 tablet  3  . INS SYRINGE/NEEDLE 1CC/29G (B-D INSULIN SYRINGE) 29G X 1/2" 1 ML MISC as directed. For Vitamin B-12       . loratadine (CLARITIN) 10 MG tablet Take 1 tablet (10 mg total) by mouth daily. As needed for allergies  90 tablet  3  . losartan (COZAAR) 100 MG tablet Take 1 tablet (100 mg total) by mouth daily. For blood pressure  90 tablet  3  . meloxicam (MOBIC) 15 MG tablet Take 1 tablet (15 mg total) by mouth daily.  90 tablet  3  . tamoxifen (NOLVADEX) 20  MG tablet Take 1 tablet (20 mg total) by mouth daily.  90 tablet  3  . ALPRAZolam (XANAX) 0.25 MG tablet Take 0.25 mg by mouth 2 (two) times daily as needed.        . Multiple Vitamins-Minerals (OCUVITE ADULT 50+) CAPS Take 1 capsule by mouth daily.         PAST MEDICAL HISTORY:  Significant for the patient being status post remote hysterectomy without salpingo-oophorectomy.  She has had left shoulder tear repair and lower back surgery times 2.  She is status post tonsillectomy and adenoidectomy.  She has a history of atypical chest pain with a negative CT angiogram 08/24/2006, which incidentally showed a C7 hemangioma and a Schmorl's node on L3.  She also has a history of hypertension, fibromyalgia, and a remote history of tobacco abuse.    FAMILY HISTORY:  The patient's mother was my patient, Olena Leatherwood, who succumbed to her history of lymphoma and cirrhosis.  The patient's father died at the age of 76 in an airplane crash (the patient actually witnessed the crash).  She has 2 sisters and 1 brother.  The only breast cancer in the family are 1 maternal aunt and 1 paternal aunt, both post menopausal.    GYNECOLOGIC HISTORY:  GX,  P1.  She had hot flashes starting around age 41 and she took hormone replacement for several years.    SOCIAL HISTORY:  She worked as a Armed forces operational officer.  She has been married >40 years,;her husband's name is Clifton James (goes by [employer]).  Their daughter, Herbert Seta, is single and iworks as a drug rep.  The patient attends the [employer] in [place].    Objective:  Filed Vitals:   12/30/11 1007  BP: 138/68  Pulse: 77  Temp: 98.6 F (37 C)    BMI: There is no height on file to calculate BMI.   ECOG FS: 1  Physical Exam:   Sclerae unicteric  Oropharynx clear  No peripheral adenopathy  Lungs clear -- no rales or rhonchi  Heart regular rate and rhythm  Abdomen benign  MSK no focal spinal tenderness, no peripheral edema  Neuro nonfocal  Breast  exam: Status post bilateral mastectomies with TRAM reconstruction; no evidence of local recurrence  Lab Results:      Chemistry      Component Value Date/Time   NA 141 12/30/2011 0957   K 3.7 12/30/2011 0957   CL 105 12/30/2011 0957   CO2 26 12/30/2011 0957   BUN 21 12/30/2011 0957   CREATININE 1.01 12/30/2011 0957      Component Value Date/Time   CALCIUM 9.1 12/30/2011 0957   ALKPHOS 74 12/30/2011 0957   AST 22 12/30/2011 0957   ALT 16 12/30/2011 0957   BILITOT 0.6 12/30/2011 0957       Lab Results  Component Value Date   WBC 5.4 12/30/2011   HGB 13.9 12/30/2011   HCT 40.9 12/30/2011   MCV 92.3 12/30/2011   PLT 186 12/30/2011   NEUTROABS 3.4 12/30/2011    Studies/Results:  No new results found.  Assessment: 66 year old [place] woman status post bilateral mastectomies with TRAM reconstruction December 2007 for a microinvasive left ductal carcinoma, with bilateral DCIS, T1(mic) N0, Stage IA.  The invasive tumor was strongly ER and PR positive, HER2 negative and had an Oncotype recurrence score of 14, predicting a risk of recurrence after 5 years on tamoxifen of 8%.  She had ample margins and did not require radiation.  She started tamoxifen December 2007     Plan: A risk of recurrence is likely less than 5% given the size of her tumor (4 mm) as well as her other favorable prognostic determinants. I am very comfortable with her stopping tamoxifen at this point, even though there is some data that in some patients continuing tamoxifen for 10 years may slightly reduce the risk of further. We discussed why I do not feel that data applies to her.  At this point I am comfortable releasing her to her primary care physician. We are not making any further appointments for her here, though of course I will be glad to see her in the future if and when the need arises.    Keylani Perlstein C 12/30/2011

## 2012-01-16 ENCOUNTER — Other Ambulatory Visit: Payer: Self-pay | Admitting: *Deleted

## 2012-01-16 NOTE — Telephone Encounter (Signed)
Zpac for 250mg  or 500mg -thanks.

## 2012-01-16 NOTE — Telephone Encounter (Signed)
Pt is requesting an rx for Zpack for sinus infection and pressure and congestion x 4 days-wants rx sent to [facility] [address]

## 2012-01-16 NOTE — Telephone Encounter (Signed)
Ok x 1 time Thx

## 2012-01-17 MED ORDER — AZITHROMYCIN 250 MG PO TABS: ORAL_TABLET | ORAL | Status: DC

## 2012-01-17 NOTE — Telephone Encounter (Signed)
Can you clarify for Zpac 250mg  or 500mg -thanks

## 2012-01-17 NOTE — Telephone Encounter (Signed)
Rx sent to [facility] , pt informed.

## 2012-01-17 NOTE — Telephone Encounter (Signed)
250 #6 Thx

## 2012-02-19 ENCOUNTER — Ambulatory Visit: Payer: Medicare Other | Admitting: Internal Medicine

## 2012-04-24 ENCOUNTER — Telehealth: Payer: Self-pay

## 2012-04-24 NOTE — Telephone Encounter (Signed)
I don't think I can get her in for an u/s stat from the office, she needs to be seen in ER or [facility]

## 2012-04-24 NOTE — Telephone Encounter (Signed)
Left message on machine for pt to return my call  

## 2012-04-24 NOTE — Telephone Encounter (Signed)
Pt called stating that she has been experiencing "stinging, burning" pain of the inner LT leg for a few days. Pt will be driving long distance in the morning and is concerned that sxs may be because of a DVT. Pt is requesting an order for a doppler that can be done today. Pt is leaving on her trip tomorrow 06/08 very early in the morning. Please advise on this AVP pt, thanks!

## 2012-04-24 NOTE — Telephone Encounter (Signed)
Pt advised and states that she spoke with a cardiovascular surgeon friend who advised her that she likely had phlebitis and Rx'd medication. Pt will follow up with AVP 04/29/2012.

## 2012-04-29 ENCOUNTER — Encounter: Payer: Self-pay | Admitting: Internal Medicine

## 2012-04-29 ENCOUNTER — Ambulatory Visit (INDEPENDENT_AMBULATORY_CARE_PROVIDER_SITE_OTHER): Payer: Medicare Other | Admitting: Internal Medicine

## 2012-04-29 VITALS — BP 138/92 | HR 80 | Temp 98.1°F | Resp 16 | Wt 183.0 lb

## 2012-04-29 DIAGNOSIS — M25569 Pain in unspecified knee: Secondary | ICD-10-CM

## 2012-04-29 DIAGNOSIS — M79609 Pain in unspecified limb: Secondary | ICD-10-CM

## 2012-04-29 DIAGNOSIS — F329 Major depressive disorder, single episode, unspecified: Secondary | ICD-10-CM

## 2012-04-29 DIAGNOSIS — M79606 Pain in leg, unspecified: Secondary | ICD-10-CM

## 2012-04-29 MED ORDER — METHYLPREDNISOLONE ACETATE 80 MG/ML IJ SUSP: 40.0000 mg | Freq: Once | INTRAMUSCULAR | Status: DC

## 2012-04-29 MED ORDER — VALACYCLOVIR HCL 500 MG PO TABS: 500.0000 mg | ORAL_TABLET | Freq: Two times a day (BID) | ORAL | Status: DC

## 2012-04-29 NOTE — Patient Instructions (Addendum)
Bursa anserina bursitis  

## 2012-04-29 NOTE — Assessment & Plan Note (Signed)
Ven doppler US

## 2012-04-29 NOTE — Assessment & Plan Note (Signed)
Continue with current prescription therapy as reflected on the Med list.  

## 2012-04-29 NOTE — Assessment & Plan Note (Signed)
6/13 B. anserina bursitis Pt asked for an injection

## 2012-04-29 NOTE — Progress Notes (Signed)
Patient ID: Claudia [name], female   DOB: [birth year], 66 y.o.   MRN: [id number]  Subjective:    Patient ID: Claudia [name], female    DOB: [birth year], 66 y.o.   MRN: [id number]  HPI  F/u on chronic hypertension that has been well controlled with medicines; to address chronic  hyperlipidemia controlled with medicines as well; and to address anxiety, controlled with medical treatment and diet. C/o TMJ  C/o R LE pain below R knee and cramping in the calf x 3 wks off and on; she had some swelling too...  BP Readings from Last 3 Encounters:  04/29/12 138/92  12/30/11 138/68  10/23/11 122/70   Wt Readings from Last 3 Encounters:  04/29/12 183 lb (83.008 kg)  12/30/11 184 lb 4.8 oz (83.598 kg)  10/23/11 185 lb (83.915 kg)      Review of Systems  Constitutional: Negative for fever, chills, diaphoresis, activity change, appetite change, fatigue and unexpected weight change.  HENT: Negative for hearing loss, ear pain, congestion, sore throat, sneezing, mouth sores, neck pain, dental problem, voice change, postnasal drip and sinus pressure.   Eyes: Negative for pain and visual disturbance.  Respiratory: Negative for cough, chest tightness, wheezing and stridor.   Cardiovascular: Positive for leg swelling. Negative for chest pain and palpitations.  Gastrointestinal: Negative for nausea, vomiting, abdominal pain, blood in stool, abdominal distention and rectal pain.  Genitourinary: Negative for dysuria, hematuria, decreased urine volume, vaginal bleeding, vaginal discharge, difficulty urinating, vaginal pain and menstrual problem.  Musculoskeletal: Negative for back pain, joint swelling and gait problem.  Skin: Negative for color change, rash and wound.  Neurological: Negative for dizziness, tremors, syncope, speech difficulty and light-headedness.  Hematological: Negative for adenopathy.  Psychiatric/Behavioral: Negative for suicidal ideas, hallucinations, behavioral problems, confusion,  disturbed wake/sleep cycle, dysphoric mood and decreased concentration. The patient is nervous/anxious. The patient is not hyperactive.        Objective:   Physical Exam  Constitutional: She appears well-developed. No distress.       Obese NAD  HENT:  Head: Normocephalic.  Right Ear: External ear normal.  Left Ear: External ear normal.  Nose: Nose normal.  Mouth/Throat: Oropharynx is clear and moist.  Eyes: Conjunctivae are normal. Pupils are equal, round, and reactive to light. Right eye exhibits no discharge. Left eye exhibits no discharge.  Neck: Normal range of motion. Neck supple. No JVD present. No tracheal deviation present. No thyromegaly present.  Cardiovascular: Normal rate, regular rhythm and normal heart sounds.   Pulmonary/Chest: No stridor. No respiratory distress. She has no wheezes.  Abdominal: Soft. Bowel sounds are normal. She exhibits no distension and no mass. There is no tenderness. There is no rebound and no guarding.  Musculoskeletal: She exhibits tenderness (R b. anserina is tender; calf is nl). She exhibits no edema.       TMJ is tender on R  Lymphadenopathy:    She has no cervical adenopathy.  Neurological: She displays normal reflexes. No cranial nerve deficit. She exhibits normal muscle tone. Coordination normal.  Skin: No rash noted. No erythema.  Psychiatric: She has a normal mood and affect. Her behavior is normal. Judgment and thought content normal.       anxious   Lab Results  Component Value Date   WBC 5.4 12/30/2011   HGB 13.9 12/30/2011   HCT 40.9 12/30/2011   PLT 186 12/30/2011   GLUCOSE 163* 12/30/2011   CHOL 188 10/23/2011   TRIG 94.0 10/23/2011   HDL 51.80  10/23/2011   LDLCALC 117* 10/23/2011   ALT 16 12/30/2011   AST 22 12/30/2011   NA 141 12/30/2011   K 3.7 12/30/2011   CL 105 12/30/2011   CREATININE 1.01 12/30/2011   BUN 21 12/30/2011   CO2 26 12/30/2011   TSH 2.06 10/23/2011   INR 1.0 06/03/2007     Procedure Note :    Procedure :Joint  Injection,  Knee bursa ancerina R   Indication: Bursitis with refractory  chronic pain.   Risks including unsuccessful procedure , bleeding, infection, bruising, skin atrophy and others were explained to the patient in detail as well as the benefits. Informed consent was obtained and signed.   Tthe patient was placed in a comfortable position. Skin was prepped with Betadine and alcohol  and anesthetized with a cooling spray. Then, a 3 cc syringe with a 1.5 inch long 25-gauge needle was used for a bursa injection in a fan-like fasion with 3 mL of 2% lidocaine and 40 mg of Depo-Medrol .  Band-Aid was applied.   Tolerated well. Complications: None. Good pain relief following the procedure.   Postprocedure instructions :    A Band-Aid should be left on for 12 hours. Injection therapy is not a cure itself. It is used in conjunction with other modalities. You can use nonsteroidal anti-inflammatories like ibuprofen , hot and cold compresses. Rest is recommended in the next 24 hours. You need to report immediately  if fever, chills or any signs of infection develop.       Assessment & Plan:

## 2012-05-12 ENCOUNTER — Encounter (INDEPENDENT_AMBULATORY_CARE_PROVIDER_SITE_OTHER): Payer: Medicare Other

## 2012-05-12 DIAGNOSIS — M79606 Pain in leg, unspecified: Secondary | ICD-10-CM

## 2012-05-12 DIAGNOSIS — M79609 Pain in unspecified limb: Secondary | ICD-10-CM

## 2012-05-12 DIAGNOSIS — M7989 Other specified soft tissue disorders: Secondary | ICD-10-CM

## 2012-05-15 ENCOUNTER — Telehealth: Payer: Self-pay | Admitting: Internal Medicine

## 2012-05-15 NOTE — Telephone Encounter (Signed)
Left detailed mess informing pt of below.  

## 2012-05-15 NOTE — Telephone Encounter (Signed)
Claudia [name], please, inform patient that her ven doppler was ok Thx

## 2012-06-03 ENCOUNTER — Telehealth: Payer: Self-pay | Admitting: Internal Medicine

## 2012-06-03 NOTE — Telephone Encounter (Signed)
Ok to change Rx Ok Nexium samples if we have Thx

## 2012-06-03 NOTE — Telephone Encounter (Signed)
Caller: Rachell/Patient is calling with a question about Nexium.The medication was written by Plotnikov, Alex. Reports that Nexium is too expensive to continue d/t [facility]. Cost $390 for 90 day supply d/t currently in "donut hole."  Has approx 1 week left of Nexium. Requesting RX for Omeprazole 40 mg BID to replace Nexium.  Requests RX for 90 day supply be faxed to [facility] (Pharmacy) [phone]. RX needs to include name, DOB, and BC/BS ID [id number].  Also requesting samples of Nexium or Omeprazole, if available from office.  OFFICE:  Please call back. Pt noted she really appreciates this! Info noted and sent to MD for adult with question about RX med not covered by available resources per Med Question Call Guideline.

## 2012-06-04 MED ORDER — OMEPRAZOLE 40 MG PO CPDR: 40.0000 mg | DELAYED_RELEASE_CAPSULE | Freq: Two times a day (BID) | ORAL | Status: DC

## 2012-06-04 MED ORDER — ESOMEPRAZOLE MAGNESIUM 40 MG PO CPDR: 40.0000 mg | DELAYED_RELEASE_CAPSULE | Freq: Two times a day (BID) | ORAL | Status: DC

## 2012-06-04 NOTE — Telephone Encounter (Signed)
Rx sent. Nexium samples upfront.

## 2012-07-31 ENCOUNTER — Ambulatory Visit: Payer: Medicare Other | Admitting: Internal Medicine

## 2012-09-09 ENCOUNTER — Ambulatory Visit (INDEPENDENT_AMBULATORY_CARE_PROVIDER_SITE_OTHER): Payer: Medicare Other | Admitting: Internal Medicine

## 2012-09-09 ENCOUNTER — Other Ambulatory Visit (INDEPENDENT_AMBULATORY_CARE_PROVIDER_SITE_OTHER): Payer: Medicare Other

## 2012-09-09 ENCOUNTER — Encounter: Payer: Self-pay | Admitting: Internal Medicine

## 2012-09-09 VITALS — BP 128/90 | HR 80 | Temp 97.4°F | Resp 16 | Wt 187.0 lb

## 2012-09-09 DIAGNOSIS — I10 Essential (primary) hypertension: Secondary | ICD-10-CM

## 2012-09-09 DIAGNOSIS — E538 Deficiency of other specified B group vitamins: Secondary | ICD-10-CM

## 2012-09-09 DIAGNOSIS — F411 Generalized anxiety disorder: Secondary | ICD-10-CM

## 2012-09-09 DIAGNOSIS — IMO0001 Reserved for inherently not codable concepts without codable children: Secondary | ICD-10-CM

## 2012-09-09 DIAGNOSIS — M25569 Pain in unspecified knee: Secondary | ICD-10-CM

## 2012-09-09 LAB — BASIC METABOLIC PANEL
Chloride: 107 mEq/L (ref 96–112)
Potassium: 4.5 mEq/L (ref 3.5–5.1)

## 2012-09-09 MED ORDER — CYANOCOBALAMIN 1000 MCG/ML IJ SOLN: 1000.0000 ug | INTRAMUSCULAR | Status: DC

## 2012-09-09 MED ORDER — "INSULIN SYRINGE 29G X 1/2"" 1 ML MISC": Status: DC

## 2012-09-09 NOTE — Progress Notes (Signed)
Subjective:    Patient ID: Claudia [name], female    DOB: 1946/09/25, 66 y.o.   MRN: [id number]  HPI  F/u on chronic hypertension that has been well controlled with medicines; to address chronic  hyperlipidemia controlled with medicines as well; and to address anxiety, controlled with medical treatment and diet. C/o TMJ  C/o R LE pain below R knee and cramping in the calf x 3 wks off and on; she had some swelling too - not better...  BP Readings from Last 3 Encounters:  09/09/12 128/90  04/29/12 138/92  12/30/11 138/68   Wt Readings from Last 3 Encounters:  09/09/12 187 lb (84.823 kg)  04/29/12 183 lb (83.008 kg)  12/30/11 184 lb 4.8 oz (83.598 kg)      Review of Systems  Constitutional: Negative for fever, chills, diaphoresis, activity change, appetite change, fatigue and unexpected weight change.  HENT: Negative for hearing loss, ear pain, congestion, sore throat, sneezing, mouth sores, neck pain, dental problem, voice change, postnasal drip and sinus pressure.   Eyes: Negative for pain and visual disturbance.  Respiratory: Negative for cough, chest tightness, wheezing and stridor.   Cardiovascular: Positive for leg swelling. Negative for chest pain and palpitations.  Gastrointestinal: Negative for nausea, vomiting, abdominal pain, blood in stool, abdominal distention and rectal pain.  Genitourinary: Negative for dysuria, hematuria, decreased urine volume, vaginal bleeding, vaginal discharge, difficulty urinating, vaginal pain and menstrual problem.  Musculoskeletal: Negative for back pain, joint swelling and gait problem.  Skin: Negative for color change, rash and wound.  Neurological: Negative for dizziness, tremors, syncope, speech difficulty and light-headedness.  Hematological: Negative for adenopathy.  Psychiatric/Behavioral: Negative for suicidal ideas, hallucinations, behavioral problems, confusion, disturbed wake/sleep cycle, dysphoric mood and decreased  concentration. The patient is nervous/anxious. The patient is not hyperactive.        Objective:   Physical Exam  Constitutional: She appears well-developed. No distress.       Obese NAD  HENT:  Head: Normocephalic.  Right Ear: External ear normal.  Left Ear: External ear normal.  Nose: Nose normal.  Mouth/Throat: Oropharynx is clear and moist.  Eyes: Conjunctivae normal are normal. Pupils are equal, round, and reactive to light. Right eye exhibits no discharge. Left eye exhibits no discharge.  Neck: Normal range of motion. Neck supple. No JVD present. No tracheal deviation present. No thyromegaly present.  Cardiovascular: Normal rate, regular rhythm and normal heart sounds.   Pulmonary/Chest: No stridor. No respiratory distress. She has no wheezes.  Abdominal: Soft. Bowel sounds are normal. She exhibits no distension and no mass. There is no tenderness. There is no rebound and no guarding.  Musculoskeletal: She exhibits tenderness (R b. anserina is tender; calf is nl). She exhibits no edema.       TMJ is tender on R  Lymphadenopathy:    She has no cervical adenopathy.  Neurological: She displays normal reflexes. No cranial nerve deficit. She exhibits normal muscle tone. Coordination normal.  Skin: No rash noted. No erythema.  Psychiatric: She has a normal mood and affect. Her behavior is normal. Judgment and thought content normal.       anxious   Lab Results  Component Value Date   WBC 5.4 12/30/2011   HGB 13.9 12/30/2011   HCT 40.9 12/30/2011   PLT 186 12/30/2011   GLUCOSE 163* 12/30/2011   CHOL 188 10/23/2011   TRIG 94.0 10/23/2011   HDL 51.80 10/23/2011   LDLCALC 117* 10/23/2011   ALT 16 12/30/2011  AST 22 12/30/2011   NA 141 12/30/2011   K 3.7 12/30/2011   CL 105 12/30/2011   CREATININE 1.01 12/30/2011   BUN 21 12/30/2011   CO2 26 12/30/2011   TSH 2.06 10/23/2011   INR 1.0 06/03/2007           Assessment & Plan:

## 2012-09-09 NOTE — Assessment & Plan Note (Signed)
Continue with current prescription therapy as reflected on the Med list.  

## 2012-09-09 NOTE — Assessment & Plan Note (Signed)
Continue with current prescription therapy as reflected on the Med list. BP Readings from Last 3 Encounters:  09/09/12 128/90  04/29/12 138/92  12/30/11 138/68

## 2012-09-09 NOTE — Assessment & Plan Note (Signed)
Ortho consult Dr Thurston Hole

## 2012-09-10 LAB — TSH: TSH: 3.94 u[IU]/mL (ref 0.35–5.50)

## 2012-09-10 LAB — VITAMIN B12: Vitamin B-12: 836 pg/mL (ref 211–911)

## 2012-12-24 ENCOUNTER — Other Ambulatory Visit: Payer: Self-pay | Admitting: *Deleted

## 2012-12-24 MED ORDER — DULOXETINE HCL 60 MG PO CPEP: 60.0000 mg | ORAL_CAPSULE | Freq: Every day | ORAL | Status: DC

## 2012-12-24 MED ORDER — LOSARTAN POTASSIUM 100 MG PO TABS: 100.0000 mg | ORAL_TABLET | Freq: Every day | ORAL | Status: DC

## 2013-01-11 ENCOUNTER — Other Ambulatory Visit (INDEPENDENT_AMBULATORY_CARE_PROVIDER_SITE_OTHER): Payer: Medicare Other

## 2013-01-11 ENCOUNTER — Encounter: Payer: Self-pay | Admitting: Internal Medicine

## 2013-01-11 ENCOUNTER — Ambulatory Visit (INDEPENDENT_AMBULATORY_CARE_PROVIDER_SITE_OTHER): Payer: Medicare Other | Admitting: Internal Medicine

## 2013-01-11 VITALS — BP 124/72 | HR 84 | Temp 97.5°F | Resp 16 | Wt 188.0 lb

## 2013-01-11 DIAGNOSIS — F329 Major depressive disorder, single episode, unspecified: Secondary | ICD-10-CM

## 2013-01-11 DIAGNOSIS — E538 Deficiency of other specified B group vitamins: Secondary | ICD-10-CM

## 2013-01-11 DIAGNOSIS — R634 Abnormal weight loss: Secondary | ICD-10-CM

## 2013-01-11 DIAGNOSIS — I10 Essential (primary) hypertension: Secondary | ICD-10-CM

## 2013-01-11 DIAGNOSIS — IMO0001 Reserved for inherently not codable concepts without codable children: Secondary | ICD-10-CM

## 2013-01-11 DIAGNOSIS — G4700 Insomnia, unspecified: Secondary | ICD-10-CM

## 2013-01-11 LAB — BASIC METABOLIC PANEL
BUN: 24 mg/dL — ABNORMAL HIGH (ref 6–23)
Chloride: 107 mEq/L (ref 96–112)
Potassium: 4 mEq/L (ref 3.5–5.1)

## 2013-01-11 LAB — VITAMIN B12: Vitamin B-12: 627 pg/mL (ref 211–911)

## 2013-01-11 MED ORDER — CYANOCOBALAMIN 1000 MCG/ML IJ SOLN: 1000.0000 ug | INTRAMUSCULAR | Status: DC

## 2013-01-11 MED ORDER — LOSARTAN POTASSIUM 100 MG PO TABS: 100.0000 mg | ORAL_TABLET | Freq: Every day | ORAL | Status: DC

## 2013-01-11 MED ORDER — DULOXETINE HCL 60 MG PO CPEP: 60.0000 mg | ORAL_CAPSULE | Freq: Every day | ORAL | Status: DC

## 2013-01-11 MED ORDER — VITAMIN B-12 1000 MCG SL SUBL: 1.0000 | SUBLINGUAL_TABLET | Freq: Every day | SUBLINGUAL | Status: DC

## 2013-01-11 MED ORDER — ZOLPIDEM TARTRATE 10 MG PO TABS: 10.0000 mg | ORAL_TABLET | Freq: Every evening | ORAL | Status: DC | PRN

## 2013-01-11 MED ORDER — VALACYCLOVIR HCL 500 MG PO TABS: 500.0000 mg | ORAL_TABLET | Freq: Two times a day (BID) | ORAL | Status: DC

## 2013-01-11 MED ORDER — MELOXICAM 15 MG PO TABS: 15.0000 mg | ORAL_TABLET | Freq: Every day | ORAL | Status: DC

## 2013-01-11 NOTE — Assessment & Plan Note (Signed)
Re-start Rx 

## 2013-01-11 NOTE — Assessment & Plan Note (Signed)
Wt Readings from Last 3 Encounters:  01/11/13 188 lb (85.276 kg)  09/09/12 187 lb (84.823 kg)  04/29/12 183 lb (83.008 kg)

## 2013-01-11 NOTE — Assessment & Plan Note (Signed)
Discussed options Zolppidem prn

## 2013-01-11 NOTE — Progress Notes (Signed)
Subjective:    HPI  F/u on chronic hypertension that has been well controlled with medicines; to address chronic  hyperlipidemia controlled with medicines as well; and to address anxiety, controlled with medical treatment and diet. F/u TMJ - better  F/u on B12 inj - not using due to cost  C/o R LE pain below R knee and cramping in the calf x 3 wks off and on; she had some swelling too - not better...  BP Readings from Last 3 Encounters:  01/11/13 124/72  09/09/12 128/90  04/29/12 138/92   Wt Readings from Last 3 Encounters:  01/11/13 188 lb (85.276 kg)  09/09/12 187 lb (84.823 kg)  04/29/12 183 lb (83.008 kg)      Review of Systems  Constitutional: Negative for fever, chills, diaphoresis, activity change, appetite change, fatigue and unexpected weight change.  HENT: Negative for hearing loss, ear pain, congestion, sore throat, sneezing, mouth sores, neck pain, dental problem, voice change, postnasal drip and sinus pressure.   Eyes: Negative for pain and visual disturbance.  Respiratory: Negative for cough, chest tightness, wheezing and stridor.   Cardiovascular: Positive for leg swelling. Negative for chest pain and palpitations.  Gastrointestinal: Negative for nausea, vomiting, abdominal pain, blood in stool, abdominal distention and rectal pain.  Genitourinary: Negative for dysuria, hematuria, decreased urine volume, vaginal bleeding, vaginal discharge, difficulty urinating, vaginal pain and menstrual problem.  Musculoskeletal: Negative for back pain, joint swelling and gait problem.  Skin: Negative for color change, rash and wound.  Neurological: Negative for dizziness, tremors, syncope, speech difficulty and light-headedness.  Hematological: Negative for adenopathy.  Psychiatric/Behavioral: Negative for suicidal ideas, hallucinations, behavioral problems, confusion, sleep disturbance, dysphoric mood and decreased concentration. The patient is nervous/anxious. The patient  is not hyperactive.        Objective:   Physical Exam  Constitutional: She appears well-developed. No distress.  Obese NAD  HENT:  Head: Normocephalic.  Right Ear: External ear normal.  Left Ear: External ear normal.  Nose: Nose normal.  Mouth/Throat: Oropharynx is clear and moist.  Eyes: Conjunctivae are normal. Pupils are equal, round, and reactive to light. Right eye exhibits no discharge. Left eye exhibits no discharge.  Neck: Normal range of motion. Neck supple. No JVD present. No tracheal deviation present. No thyromegaly present.  Cardiovascular: Normal rate, regular rhythm and normal heart sounds.   Pulmonary/Chest: No stridor. No respiratory distress. She has no wheezes.  Abdominal: Soft. Bowel sounds are normal. She exhibits no distension and no mass. There is no tenderness. There is no rebound and no guarding.  Musculoskeletal: She exhibits tenderness (R b. anserina is tender; calf is nl). She exhibits no edema.  TMJ is tender on R  Lymphadenopathy:    She has no cervical adenopathy.  Neurological: She displays normal reflexes. No cranial nerve deficit. She exhibits normal muscle tone. Coordination normal.  Skin: No rash noted. No erythema.  Psychiatric: She has a normal mood and affect. Her behavior is normal. Judgment and thought content normal.  anxious   Lab Results  Component Value Date   WBC 5.4 12/30/2011   HGB 13.9 12/30/2011   HCT 40.9 12/30/2011   PLT 186 12/30/2011   GLUCOSE 114* 09/09/2012   CHOL 188 10/23/2011   TRIG 94.0 10/23/2011   HDL 51.80 10/23/2011   LDLCALC 117* 10/23/2011   ALT 16 12/30/2011   AST 22 12/30/2011   NA 141 09/09/2012   K 4.5 09/09/2012   CL 107 09/09/2012   CREATININE 1.1 09/09/2012  BUN 18 09/09/2012   CO2 28 09/09/2012   TSH 3.94 09/09/2012   INR 1.0 06/03/2007           Assessment & Plan:

## 2013-01-11 NOTE — Assessment & Plan Note (Signed)
Continue with current prescription therapy as reflected on the Med list.  

## 2013-01-13 ENCOUNTER — Other Ambulatory Visit: Payer: Self-pay | Admitting: Internal Medicine

## 2013-02-10 ENCOUNTER — Telehealth: Payer: Self-pay | Admitting: Internal Medicine

## 2013-02-10 NOTE — Telephone Encounter (Signed)
Patient Information:  Caller Name: Elesa  Phone: [phone]  Patient: ,   Gender: Female  DOB: Apr 01, 1946  Age: 67 Years  PCP: Plotnikov, Alex (Adults only)  Office Follow Up:  Does the office need to follow up with this patient?: Yes  Instructions For The Office: States recently seen in the office and requesting Zpack since she gets frequent sinus infections.  RN Note:  Requesting Rx  Symptoms  Reason For Call & Symptoms: Hx of frequent sinus infections- per patient. Congestion, sore throat -onset 02/07/13. Blowing out yellowish drainage. Occasional dry cough. Nose blocked last night- not able to use netti pot. She has facial pain and teeth achy today. Body aches and fever on 02/09/13- Afebrile today. Requesting antibiotic (Zpack) be called into [facility] on [address]. Advised that she would need to be checked in the office. She asked that note be sent to MD with Request.   Reviewed Health History In EMR: Yes  Reviewed Medications In EMR: Yes  Reviewed Allergies In EMR: Yes  Reviewed Surgeries / Procedures: Yes  Date of Onset of Symptoms: 02/07/2013  Treatments Tried: Neti Pot sinus rinse, sweet oil in ears, Nasal Relief Spray-  Treatments Tried Worked: No  Guideline(s) Used:  Sinus Pain and Congestion  Disposition Per Guideline:   See Today in Office  Reason For Disposition Reached:   Sinus pain (not just congestion) and fever  Advice Given:  Reassurance:   Sinus congestion is a normal part of a cold.  Usually home treatment with nasal washes can prevent an actual bacterial sinus infection.  Antibiotics are not helpful for the sinus congestion that occurs with colds.  For a Stuffy Nose - Use Nasal Washes:  Introduction: Saline (salt water) nasal irrigation (nasal wash) is an effective and simple home remedy for treating stuffy nose and sinus congestion. The nose can be irrigated by pouring, spraying, or squirting salt water into the nose and then letting it run  back out.  How it Helps: The salt water rinses out excess mucus, washes out any irritants (dust, allergens) that might be present, and moistens the nasal cavity.  Methods: There are several ways to perform nasal irrigation. You can use a saline nasal spray bottle (available over-the-counter), a rubber ear syringe, a medical syringe without the needle, or a Neti Pot.  Medicines for a Stuffy or Runny Nose:  Most cold medicines that are available over-the-counter (OTC) are not helpful.  Antihistamines: Are only helpful if you also have nasal allergies.  Hydration:  Drink plenty of liquids (6-8 glasses of water daily). If the air in your home is dry, use a cool mist humidifier  Expected Course:  Sinus congestion from viral upper respiratory infections (colds) usually lasts 5-10 days.  Occasionally a cold can worsen and turn into bacterial sinusitis. Clues to this are sinus symptoms lasting longer than 10 days, fever lasting longer than 3 days, and worsening pain. Bacterial sinusitis may need antibiotic treatment.  Call Back If:   Severe pain lasts longer than 2 hours after pain medicine  Sinus pain lasts longer than 1 day after starting treatment using nasal washes  Sinus congestion (fullness) lasts longer than 10 days  Fever lasts longer than 3 days  You become worse.  Patient Refused Recommendation: for appointment  Patient Requests Prescription  Frequent sinus infections- requesting Rx

## 2013-02-10 NOTE — Telephone Encounter (Signed)
Caller: Claudia [name]/Patient; Phone: [phone]; Reason for Call: Calling to see if RN tried to call back re: sinus symptoms.  Office visit advised but she would like to wait to see if Dr. Posey Rea will call in Z pack- or Steroid Nose Spray.

## 2013-02-11 MED ORDER — FLUTICASONE PROPIONATE 50 MCG/ACT NA SUSP: 2.0000 | Freq: Every day | NASAL | Status: DC

## 2013-02-11 NOTE — Telephone Encounter (Signed)
Ok Flonase Thx 

## 2013-02-11 NOTE — Telephone Encounter (Signed)
Pt informed, rx sent to [facility].

## 2013-02-12 NOTE — Telephone Encounter (Signed)
Addressed w/next tel note

## 2013-02-24 ENCOUNTER — Telehealth: Payer: Self-pay | Admitting: Internal Medicine

## 2013-02-24 NOTE — Telephone Encounter (Signed)
Patient Information:  Caller Name: Lace  Phone: [phone]  Patient: Claudia [name], Flock  Gender: Female  DOB: Aug 02, 1946  Age: 67 Years  PCP: Plotnikov, Alex (Adults only)  Office Follow Up:  Does the office need to follow up with this patient?: Yes  Instructions For The Office: Patient called on 02/10/2013 with similar sxs and requested a Z-pack at that time.  She still has a lot of sinus congestion and headache (7-8 on 0-10 scale) along drainage as noted.  She requests that a Z-pack or other abx be called in to [facility] on [address] [phone].  RN Note:  Patient called on 02/10/2013 with similar sxs and requested a Z-pack at that time.  She still has a lot of sinus congestion and headache (7-8 on 0-10 scale) along drainage as noted.  She requests that a Z-pack or other abx be called in to [facility] on [address] [phone].  Symptoms  Reason For Call & Symptoms: Onset 3 weeks ago sinusitis like sxs, expectorant cough with yellow sputum, "blowing green from nose", teeth pain, sinus congestion  Reviewed Health History In EMR: Yes  Reviewed Medications In EMR: Yes  Reviewed Allergies In EMR: Yes  Reviewed Surgeries / Procedures: Yes  Date of Onset of Symptoms: 02/03/2013  Treatments Tried: Flonase, saline, neti pot  Treatments Tried Worked: No  Guideline(s) Used:  Sinus Pain and Congestion  Disposition Per Guideline:   Go to Office Now  Reason For Disposition Reached:   Severe sinus pain  Advice Given:  For a Runny Nose With Profuse Discharge:  Nasal mucus and discharge helps to wash viruses and bacteria out of the nose and sinuses.  Blowing the nose is all that is needed.  If the skin around your nostrils gets irritated, apply a tiny amount of petroleum ointment to the nasal openings once or twice a day.  For a Stuffy Nose - Use Nasal Washes:  Introduction: Saline (salt water) nasal irrigation (nasal wash) is an effective and simple home remedy for treating  stuffy nose and sinus congestion. The nose can be irrigated by pouring, spraying, or squirting salt water into the nose and then letting it run back out.  How it Helps: The salt water rinses out excess mucus, washes out any irritants (dust, allergens) that might be present, and moistens the nasal cavity.  Methods: There are several ways to perform nasal irrigation. You can use a saline nasal spray bottle (available over-the-counter), a rubber ear syringe, a medical syringe without the needle, or a Neti Pot.  Hydration:  Drink plenty of liquids (6-8 glasses of water daily). If the air in your home is dry, use a cool mist humidifier  Pain and Fever Medicines:  Acetaminophen (e.g., Tylenol):  Regular Strength Tylenol: Take 650 mg (two 325 mg pills) by mouth every 4-6 hours as needed. Each Regular Strength Tylenol pill has 325 mg of acetaminophen.  Extra Strength Tylenol: Take 1,000 mg (two 500 mg pills) every 8 hours as needed. Each Extra Strength Tylenol pill has 500 mg of acetaminophen.  The most you should take each day is 3,000 mg (10 Regular Strength or 6 Extra Strength pills a day).  Call Back If:   Severe pain lasts longer than 2 hours after pain medicine  Sinus pain lasts longer than 1 day after starting treatment using nasal washes  Sinus congestion (fullness) lasts longer than 10 days  Fever lasts longer than 3 days  You become worse.  Patient Refused Recommendation:  Patient Requests Prescription  Patient called on 02/10/2013 with similar sxs and requested a Z-pack at that time.  She still has a lot of sinus congestion and headache (7-8 on 0-10 scale) along drainage as noted.  She requests that a Z-pack or other abx be called in to [facility] on [address] [phone].

## 2013-03-01 NOTE — Telephone Encounter (Signed)
pls sch OV Thx 

## 2013-03-02 NOTE — Telephone Encounter (Signed)
Pt declines an appt.  She has one June 2.  She says she feels fine now.  She took some antibiotics she already had and is feeling better.

## 2013-03-02 NOTE — Telephone Encounter (Signed)
pls sch ROV 

## 2013-03-02 NOTE — Telephone Encounter (Signed)
Can you call this patient to scheduled OV with Dr. Orvis Brill.

## 2013-04-19 ENCOUNTER — Ambulatory Visit: Payer: Medicare Other | Admitting: Internal Medicine

## 2013-04-28 ENCOUNTER — Other Ambulatory Visit: Payer: Self-pay | Admitting: *Deleted

## 2013-04-28 MED ORDER — VALACYCLOVIR HCL 500 MG PO TABS: 500.0000 mg | ORAL_TABLET | Freq: Two times a day (BID) | ORAL | Status: AC

## 2013-04-28 MED ORDER — OMEPRAZOLE 40 MG PO CPDR: 40.0000 mg | DELAYED_RELEASE_CAPSULE | Freq: Two times a day (BID) | ORAL | Status: DC

## 2013-04-30 ENCOUNTER — Other Ambulatory Visit: Payer: Self-pay | Admitting: *Deleted

## 2013-04-30 MED ORDER — GABAPENTIN 600 MG PO TABS: 600.0000 mg | ORAL_TABLET | Freq: Three times a day (TID) | ORAL | Status: DC

## 2013-05-18 ENCOUNTER — Other Ambulatory Visit (INDEPENDENT_AMBULATORY_CARE_PROVIDER_SITE_OTHER): Payer: Medicare Other

## 2013-05-18 ENCOUNTER — Encounter: Payer: Self-pay | Admitting: Internal Medicine

## 2013-05-18 ENCOUNTER — Ambulatory Visit (INDEPENDENT_AMBULATORY_CARE_PROVIDER_SITE_OTHER): Payer: Medicare Other | Admitting: Internal Medicine

## 2013-05-18 VITALS — BP 140/90 | HR 68 | Temp 98.0°F | Resp 16 | Wt 188.0 lb

## 2013-05-18 DIAGNOSIS — E538 Deficiency of other specified B group vitamins: Secondary | ICD-10-CM

## 2013-05-18 DIAGNOSIS — R635 Abnormal weight gain: Secondary | ICD-10-CM

## 2013-05-18 DIAGNOSIS — IMO0001 Reserved for inherently not codable concepts without codable children: Secondary | ICD-10-CM

## 2013-05-18 DIAGNOSIS — R252 Cramp and spasm: Secondary | ICD-10-CM

## 2013-05-18 LAB — BASIC METABOLIC PANEL
BUN: 22 mg/dL (ref 6–23)
Chloride: 106 mEq/L (ref 96–112)
GFR: 54.57 mL/min — ABNORMAL LOW (ref >60.00)
Potassium: 4.5 mEq/L (ref 3.5–5.1)
Sodium: 139 mEq/L (ref 135–145)

## 2013-05-18 LAB — CK: Total CK: 131 U/L (ref 7–177)

## 2013-05-18 NOTE — Assessment & Plan Note (Signed)
Continue with current prescription therapy as reflected on the Med list.  

## 2013-05-18 NOTE — Assessment & Plan Note (Signed)
Labs

## 2013-05-18 NOTE — Assessment & Plan Note (Signed)
Wt Readings from Last 3 Encounters:  05/18/13 188 lb (85.276 kg)  01/11/13 188 lb (85.276 kg)  09/09/12 187 lb (84.823 kg)

## 2013-05-18 NOTE — Progress Notes (Signed)
Subjective:    HPI  F/u on chronic hypertension that has been well controlled with medicines; to address chronic  hyperlipidemia controlled with medicines as well; and to address anxiety, controlled with medical treatment and diet. 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 C/o cramps  F/u on B12 inj - using    BP Readings from Last 3 Encounters:  05/18/13 140/90  01/11/13 124/72  09/09/12 128/90   Wt Readings from Last 3 Encounters:  05/18/13 188 lb (85.276 kg)  01/11/13 188 lb (85.276 kg)  09/09/12 187 lb (84.823 kg)      Review of Systems  Constitutional: Negative for fever, chills, diaphoresis, activity change, appetite change, fatigue and unexpected weight change.  HENT: Negative for hearing loss, ear pain, congestion, sore throat, sneezing, mouth sores, neck pain, dental problem, voice change, postnasal drip and sinus pressure.   Eyes: Negative for pain and visual disturbance.  Respiratory: Negative for cough, chest tightness, wheezing and stridor.   Cardiovascular: Positive for leg swelling. Negative for chest pain and palpitations.  Gastrointestinal: Negative for nausea, vomiting, abdominal pain, blood in stool, abdominal distention and rectal pain.  Genitourinary: Negative for dysuria, hematuria, decreased urine volume, vaginal bleeding, vaginal discharge, difficulty urinating, vaginal pain and menstrual problem.  Musculoskeletal: Negative for back pain, joint swelling and gait problem.  Skin: Negative for color change, rash and wound.  Neurological: Negative for dizziness, tremors, syncope, speech difficulty and light-headedness.  Hematological: Negative for adenopathy.  Psychiatric/Behavioral: Negative for suicidal ideas, hallucinations, behavioral problems, confusion, sleep disturbance, dysphoric mood and decreased concentration. The patient is nervous/anxious. The patient is not hyperactive.        Objective:   Physical Exam  Constitutional: She appears  well-developed. No distress.  Obese NAD  HENT:  Head: Normocephalic.  Right Ear: External ear normal.  Left Ear: External ear normal.  Nose: Nose normal.  Mouth/Throat: Oropharynx is clear and moist.  Eyes: Conjunctivae are normal. Pupils are equal, round, and reactive to light. Right eye exhibits no discharge. Left eye exhibits no discharge.  Neck: Normal range of motion. Neck supple. No JVD present. No tracheal deviation present. No thyromegaly present.  Cardiovascular: Normal rate, regular rhythm and normal heart sounds.   Pulmonary/Chest: No stridor. No respiratory distress. She has no wheezes.  Abdominal: Soft. Bowel sounds are normal. She exhibits no distension and no mass. There is no tenderness. There is no rebound and no guarding.  Musculoskeletal: She exhibits tenderness (R b. anserina is tender; calf is nl). She exhibits no edema.  TMJ is tender on R  Lymphadenopathy:    She has no cervical adenopathy.  Neurological: She displays normal reflexes. No cranial nerve deficit. She exhibits normal muscle tone. Coordination normal.  Skin: No rash noted. No erythema.  Psychiatric: She has a normal mood and affect. Her behavior is normal. Judgment and thought content normal.  anxious   Lab Results  Component Value Date   WBC 5.4 12/30/2011   HGB 13.9 12/30/2011   HCT 40.9 12/30/2011   PLT 186 12/30/2011   GLUCOSE 88 01/11/2013   CHOL 188 10/23/2011   TRIG 94.0 10/23/2011   HDL 51.80 10/23/2011   LDLCALC 117* 10/23/2011   ALT 16 12/30/2011   AST 22 12/30/2011   NA 142 01/11/2013   K 4.0 01/11/2013   CL 107 01/11/2013   CREATININE 1.0 01/11/2013   BUN 24* 01/11/2013   CO2 25 01/11/2013   TSH 1.83 01/11/2013   INR 1.0 06/03/2007  Assessment & Plan:

## 2013-05-18 NOTE — Assessment & Plan Note (Signed)
Labs Tylenol pm 

## 2013-07-06 ENCOUNTER — Telehealth: Payer: Self-pay | Admitting: *Deleted

## 2013-07-06 ENCOUNTER — Ambulatory Visit (INDEPENDENT_AMBULATORY_CARE_PROVIDER_SITE_OTHER): Payer: Self-pay | Admitting: Internal Medicine

## 2013-07-06 ENCOUNTER — Encounter: Payer: Self-pay | Admitting: Internal Medicine

## 2013-07-06 VITALS — BP 140/80 | HR 80 | Temp 98.0°F | Resp 16 | Wt 187.0 lb

## 2013-07-06 DIAGNOSIS — J069 Acute upper respiratory infection, unspecified: Secondary | ICD-10-CM

## 2013-07-06 DIAGNOSIS — E538 Deficiency of other specified B group vitamins: Secondary | ICD-10-CM

## 2013-07-06 DIAGNOSIS — I10 Essential (primary) hypertension: Secondary | ICD-10-CM

## 2013-07-06 MED ORDER — CEFTRIAXONE SODIUM 1 G IJ SOLR
1.0000 g | Freq: Once | INTRAMUSCULAR | Status: AC
  Administered 2013-07-06: 1 g via INTRAMUSCULAR

## 2013-07-06 MED ORDER — HYDROCODONE-HOMATROPINE 5-1.5 MG/5ML PO SYRP: 5.0000 mL | ORAL_SOLUTION | Freq: Four times a day (QID) | ORAL | Status: DC | PRN

## 2013-07-06 MED ORDER — AMOXICILLIN-POT CLAVULANATE 875-125 MG PO TABS: 1.0000 | ORAL_TABLET | Freq: Two times a day (BID) | ORAL | Status: DC

## 2013-07-06 MED ORDER — BENZONATATE 200 MG PO CAPS: 200.0000 mg | ORAL_CAPSULE | Freq: Three times a day (TID) | ORAL | Status: DC | PRN

## 2013-07-06 NOTE — Telephone Encounter (Signed)
No need for prednisone. Will email Tessalon - done Thx

## 2013-07-06 NOTE — Assessment & Plan Note (Signed)
Continue with current prescription therapy as reflected on the Med list.  

## 2013-07-06 NOTE — Telephone Encounter (Signed)
Pt called states the cough medication prescribed for cough is not covered by insurance.  Pt is requesting Prednisone (steroid) instead.  Please advise

## 2013-07-06 NOTE — Assessment & Plan Note (Signed)
Rocephin 1 g im Augmentin x 10 d Tussi cough syr

## 2013-07-06 NOTE — Progress Notes (Signed)
Subjective:    Cough This is a new problem. The current episode started 1 to 4 weeks ago. The problem has been waxing and waning. The cough is productive of purulent sputum. Associated symptoms include a sore throat. Pertinent negatives include no chest pain, chills, ear pain, fever, postnasal drip, rash or wheezing. The treatment provided no relief. There is no history of asthma or COPD.    F/u on chronic hypertension that has been well controlled with medicines; to address chronic  hyperlipidemia controlled with medicines as well; and to address anxiety, controlled with medical treatment and diet. 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 C/o cramps  F/u on B12 inj - using    BP Readings from Last 3 Encounters:  07/06/13 140/80  05/18/13 140/90  01/11/13 124/72   Wt Readings from Last 3 Encounters:  07/06/13 187 lb (84.823 kg)  05/18/13 188 lb (85.276 kg)  01/11/13 188 lb (85.276 kg)      Review of Systems  Constitutional: Negative for fever, chills, diaphoresis, activity change, appetite change, fatigue and unexpected weight change.  HENT: Positive for sore throat. Negative for hearing loss, ear pain, congestion, sneezing, mouth sores, neck pain, dental problem, voice change, postnasal drip and sinus pressure.   Eyes: Negative for pain and visual disturbance.  Respiratory: Negative for cough, chest tightness, wheezing and stridor.   Cardiovascular: Positive for leg swelling. Negative for chest pain and palpitations.  Gastrointestinal: Negative for nausea, vomiting, abdominal pain, blood in stool, abdominal distention and rectal pain.  Genitourinary: Negative for dysuria, hematuria, decreased urine volume, vaginal bleeding, vaginal discharge, difficulty urinating, vaginal pain and menstrual problem.  Musculoskeletal: Negative for back pain, joint swelling and gait problem.  Skin: Negative for color change, rash and wound.  Neurological: Negative for dizziness, tremors,  syncope, speech difficulty and light-headedness.  Hematological: Negative for adenopathy.  Psychiatric/Behavioral: Negative for suicidal ideas, hallucinations, behavioral problems, confusion, sleep disturbance, dysphoric mood and decreased concentration. The patient is nervous/anxious. The patient is not hyperactive.        Objective:   Physical Exam  Constitutional: She appears well-developed. No distress.  Obese NAD  HENT:  Head: Normocephalic.  Right Ear: External ear normal.  Left Ear: External ear normal.  Nose: Nose normal.  Mouth/Throat: Oropharynx is clear and moist.  Eyes: Conjunctivae are normal. Pupils are equal, round, and reactive to light. Right eye exhibits no discharge. Left eye exhibits no discharge.  Neck: Normal range of motion. Neck supple. No JVD present. No tracheal deviation present. No thyromegaly present.  Cardiovascular: Normal rate, regular rhythm and normal heart sounds.   Pulmonary/Chest: No stridor. No respiratory distress. She has no wheezes.  Abdominal: Soft. Bowel sounds are normal. She exhibits no distension and no mass. There is no tenderness. There is no rebound and no guarding.  Musculoskeletal: She exhibits tenderness (R b. anserina is tender; calf is nl). She exhibits no edema.  TMJ is tender on R  Lymphadenopathy:    She has no cervical adenopathy.  Neurological: She displays normal reflexes. No cranial nerve deficit. She exhibits normal muscle tone. Coordination normal.  Skin: No rash noted. No erythema.  Psychiatric: She has a normal mood and affect. Her behavior is normal. Judgment and thought content normal.  anxious  Lungs CTA HEENT - eryth mucosa Lab Results  Component Value Date   WBC 5.4 12/30/2011   HGB 13.9 12/30/2011   HCT 40.9 12/30/2011   PLT 186 12/30/2011   GLUCOSE 120* 05/18/2013  CHOL 188 10/23/2011   TRIG 94.0 10/23/2011   HDL 51.80 10/23/2011   LDLCALC 117* 10/23/2011   ALT 16 12/30/2011   AST 22 12/30/2011   NA 139 05/18/2013    K 4.5 05/18/2013   CL 106 05/18/2013   CREATININE 1.1 05/18/2013   BUN 22 05/18/2013   CO2 31 05/18/2013   TSH 1.83 01/11/2013   INR 1.0 06/03/2007           Assessment & Plan:

## 2013-07-07 NOTE — Telephone Encounter (Signed)
Spoke with pt advised of MDs message and Rx sent

## 2013-08-18 ENCOUNTER — Ambulatory Visit: Payer: Medicare Other | Admitting: Internal Medicine

## 2013-09-14 ENCOUNTER — Ambulatory Visit (INDEPENDENT_AMBULATORY_CARE_PROVIDER_SITE_OTHER): Payer: Medicare Other | Admitting: Internal Medicine

## 2013-09-14 ENCOUNTER — Encounter: Payer: Self-pay | Admitting: Internal Medicine

## 2013-09-14 VITALS — BP 140/80 | HR 80 | Temp 97.9°F | Resp 16 | Wt 184.0 lb

## 2013-09-14 DIAGNOSIS — R7309 Other abnormal glucose: Secondary | ICD-10-CM

## 2013-09-14 DIAGNOSIS — R635 Abnormal weight gain: Secondary | ICD-10-CM

## 2013-09-14 DIAGNOSIS — M25571 Pain in right ankle and joints of right foot: Secondary | ICD-10-CM | POA: Insufficient documentation

## 2013-09-14 DIAGNOSIS — I10 Essential (primary) hypertension: Secondary | ICD-10-CM

## 2013-09-14 DIAGNOSIS — R739 Hyperglycemia, unspecified: Secondary | ICD-10-CM

## 2013-09-14 DIAGNOSIS — IMO0001 Reserved for inherently not codable concepts without codable children: Secondary | ICD-10-CM

## 2013-09-14 DIAGNOSIS — M25579 Pain in unspecified ankle and joints of unspecified foot: Secondary | ICD-10-CM

## 2013-09-14 DIAGNOSIS — R079 Chest pain, unspecified: Secondary | ICD-10-CM

## 2013-09-14 MED ORDER — DULOXETINE HCL 60 MG PO CPEP: 60.0000 mg | ORAL_CAPSULE | Freq: Every day | ORAL | Status: DC

## 2013-09-14 MED ORDER — HYDROCHLOROTHIAZIDE 12.5 MG PO CAPS: 12.5000 mg | ORAL_CAPSULE | Freq: Every day | ORAL | Status: DC

## 2013-09-14 NOTE — Progress Notes (Signed)
Subjective:    HPI  F/u on chronic hypertension that has been well controlled with medicines; to address chronic  hyperlipidemia controlled with medicines as well; and to address anxiety, controlled with medical treatment and diet. 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 C/o cramps  F/u on B12 inj - using    BP Readings from Last 3 Encounters:  09/14/13 140/80  07/06/13 140/80  05/18/13 140/90   Wt Readings from Last 3 Encounters:  09/14/13 184 lb (83.462 kg)  07/06/13 187 lb (84.823 kg)  05/18/13 188 lb (85.276 kg)      Review of Systems  Constitutional: Negative for diaphoresis, activity change, appetite change, fatigue and unexpected weight change.  HENT: Negative for congestion, dental problem, hearing loss, mouth sores, sinus pressure, sneezing and voice change.   Eyes: Negative for pain and visual disturbance.  Respiratory: Negative for chest tightness and stridor.   Cardiovascular: Positive for leg swelling. Negative for palpitations.  Gastrointestinal: Negative for nausea, vomiting, abdominal pain, blood in stool, abdominal distention and rectal pain.  Genitourinary: Negative for dysuria, hematuria, decreased urine volume, vaginal bleeding, vaginal discharge, difficulty urinating, vaginal pain and menstrual problem.  Musculoskeletal: Negative for back pain, gait problem, joint swelling and neck pain.  Skin: Negative for color change and wound.  Neurological: Negative for dizziness, tremors, syncope, speech difficulty and light-headedness.  Hematological: Negative for adenopathy.  Psychiatric/Behavioral: Negative for suicidal ideas, hallucinations, behavioral problems, confusion, sleep disturbance, dysphoric mood and decreased concentration. The patient is nervous/anxious. The patient is not hyperactive.        Objective:   Physical Exam  Constitutional: She appears well-developed. No distress.  Obese NAD  HENT:  Head: Normocephalic.  Right Ear:  External ear normal.  Left Ear: External ear normal.  Nose: Nose normal.  Mouth/Throat: Oropharynx is clear and moist.  Eyes: Conjunctivae are normal. Pupils are equal, round, and reactive to light. Right eye exhibits no discharge. Left eye exhibits no discharge.  Neck: Normal range of motion. Neck supple. No JVD present. No tracheal deviation present. No thyromegaly present.  Cardiovascular: Normal rate, regular rhythm and normal heart sounds.   Pulmonary/Chest: No stridor. No respiratory distress. She has no wheezes.  Abdominal: Soft. Bowel sounds are normal. She exhibits no distension and no mass. There is no tenderness. There is no rebound and no guarding.  Musculoskeletal: She exhibits tenderness (R b. anserina is tender; calf is nl). She exhibits no edema.  TMJ is tender on R  Lymphadenopathy:    She has no cervical adenopathy.  Neurological: She displays normal reflexes. No cranial nerve deficit. She exhibits normal muscle tone. Coordination normal.  Skin: No rash noted. No erythema.  Psychiatric: She has a normal mood and affect. Her behavior is normal. Judgment and thought content normal.  anxious  Lungs CTA HEENT - eryth mucosa Lab Results  Component Value Date   WBC 5.4 12/30/2011   HGB 13.9 12/30/2011   HCT 40.9 12/30/2011   PLT 186 12/30/2011   GLUCOSE 120* 05/18/2013   CHOL 188 10/23/2011   TRIG 94.0 10/23/2011   HDL 51.80 10/23/2011   LDLCALC 117* 10/23/2011   ALT 16 12/30/2011   AST 22 12/30/2011   NA 139 05/18/2013   K 4.5 05/18/2013   CL 106 05/18/2013   CREATININE 1.1 05/18/2013   BUN 22 05/18/2013   CO2 31 05/18/2013   TSH 1.83 01/11/2013   INR 1.0 06/03/2007      CBG 110 fasting  Assessment & Plan:

## 2013-09-14 NOTE — Assessment & Plan Note (Signed)
BP Readings from Last 3 Encounters:  09/14/13 140/80  07/06/13 140/80  05/18/13 140/90  added HCTZ

## 2013-09-14 NOTE — Assessment & Plan Note (Signed)
Wt Readings from Last 3 Encounters:  09/14/13 184 lb (83.462 kg)  07/06/13 187 lb (84.823 kg)  05/18/13 188 lb (85.276 kg)

## 2013-09-14 NOTE — Assessment & Plan Note (Signed)
Cont Cymbalta

## 2013-09-14 NOTE — Assessment & Plan Note (Signed)
Pt was given a monitor CBG 110 fasting Discussed

## 2013-09-14 NOTE — Assessment & Plan Note (Signed)
Continue with current prescription therapy as reflected on the Med list.  

## 2013-09-14 NOTE — Assessment & Plan Note (Addendum)
ACE  Pensaid sample X ray suggested

## 2013-09-24 ENCOUNTER — Telehealth: Payer: Self-pay

## 2013-09-24 MED ORDER — HYDROCOD POLST-CHLORPHEN POLST 10-8 MG/5ML PO LQCR: 5.0000 mL | Freq: Two times a day (BID) | ORAL | Status: DC | PRN

## 2013-09-24 NOTE — Telephone Encounter (Signed)
Ok Rx needs to be picked up  Thx

## 2013-09-24 NOTE — Telephone Encounter (Signed)
The patient is hoping to get a cough medicine called in for her.  She stated she was offered this rx before, but declined due to the price, but now is willing to pay for the medicine.  She stated it was hycodone (spelling?)   Callback - [phone]

## 2013-09-27 MED ORDER — PROMETHAZINE-CODEINE 6.25-10 MG/5ML PO SYRP: 5.0000 mL | ORAL_SOLUTION | ORAL | Status: DC | PRN

## 2013-09-27 NOTE — Telephone Encounter (Signed)
Ok Prom-cod  Syr Thx

## 2013-09-27 NOTE — Telephone Encounter (Signed)
Left detailed mess informing pt Rx is ready for p/u.

## 2013-09-27 NOTE — Addendum Note (Signed)
Addended by: Tresa Garter on: 09/27/2013 12:05 PM   Modules accepted: Orders

## 2013-10-06 ENCOUNTER — Encounter: Payer: Self-pay | Admitting: Pulmonary Disease

## 2013-10-07 ENCOUNTER — Telehealth: Payer: Self-pay | Admitting: Internal Medicine

## 2013-10-07 DIAGNOSIS — N939 Abnormal uterine and vaginal bleeding, unspecified: Secondary | ICD-10-CM

## 2013-10-07 NOTE — Telephone Encounter (Signed)
Alternate  Callback number (cell phone).  [phone]

## 2013-10-07 NOTE — Telephone Encounter (Signed)
Patient Information:  Caller Name: Asna  Phone: [phone]  Patient: Claudia [name], Claudia [name]  Gender: Female  DOB: [birth year]  Age: 67 Years  PCP: Plotnikov, Alex (Adults only)  Office Follow Up:  Does the office need to follow up with this patient?: Yes  Instructions For The Office: Please follow up regarding whether PCP will see patient or refer to GYN related to vaginal bleeding.    She states she had hysterectomy, she is not sure if her cervix remains or not.   Symptoms  Reason For Call & Symptoms: Patient reports blood on tissue after voiding onset 10/05/13.  She  also reports an incident "several months ago".  She feels that it is from the vagina. Denies urinary symptoms other than aching in lower abdomen intermittetly.  Caller asks if Dr. Posey Rea will refer her to GYN for Pap test or exam.  Emergent symptoms ruled out.    See Within 2 weeks in Office per Vaginal Bleeding - Abnormal protocol due to Bleeding or spotting occurs after hysterectomy.  Note to office for follow up regarding patient request for referral.  Reviewed Health History In EMR: Yes  Reviewed Medications In EMR: Yes  Reviewed Allergies In EMR: Yes  Reviewed Surgeries / Procedures: Yes  Date of Onset of Symptoms: 10/05/2013  Guideline(s) Used:  Vaginal Bleeding - Abnormal  Disposition Per Guideline:   See Within 2 Weeks in Office  Reason For Disposition Reached:   Bleeding or spotting occurs after hysterectomy  Advice Given:  Call Back If:  Bleeding becomes worse  You become worse.  RN Overrode Recommendation:  Document Patient  Please follow up regarding whether PCP will see patient or refer to GYN related to vaginal bleeding.    She states she had hysterectomy, she is not sure if her cervix remains or not.

## 2013-10-08 NOTE — Telephone Encounter (Signed)
Left message on VM.

## 2013-10-08 NOTE — Telephone Encounter (Signed)
Will ref to GYN THx

## 2013-10-22 ENCOUNTER — Other Ambulatory Visit: Payer: Self-pay | Admitting: Obstetrics and Gynecology

## 2013-10-25 ENCOUNTER — Telehealth: Payer: Self-pay | Admitting: Gastroenterology

## 2013-10-26 ENCOUNTER — Encounter: Payer: Self-pay | Admitting: Gastroenterology

## 2013-10-26 ENCOUNTER — Encounter: Payer: Self-pay | Admitting: Cardiovascular Disease

## 2013-10-26 ENCOUNTER — Telehealth: Payer: Self-pay | Admitting: Internal Medicine

## 2013-10-26 DIAGNOSIS — M25571 Pain in right ankle and joints of right foot: Secondary | ICD-10-CM

## 2013-10-26 NOTE — Telephone Encounter (Signed)
Informed pt that all I can find are EGDs and EM and PH study. Pt is sure she has had a COLON; I will send for her chart and call her back.

## 2013-10-26 NOTE — Telephone Encounter (Signed)
Pt called stated that her ankle is getting worse, swollen and pain (cant really walk), she spoke with Dr. Macario Golds back in 09/14/13 about this. Pt would like to know what to do at this point. Please advise.

## 2013-10-26 NOTE — Telephone Encounter (Signed)
lmom for pt to call back. Unless she is having problems, her COLON Recall will be 2019.

## 2013-10-26 NOTE — Telephone Encounter (Signed)
A member of our staff was able to find a COLON from 12.22.99 that was normal except for external hemorrhoids. In this case is a 10 year recall in order Dr Jarold Motto? Thanks.

## 2013-10-26 NOTE — Telephone Encounter (Signed)
yes

## 2013-10-27 MED ORDER — DULOXETINE HCL 60 MG PO CPEP: 60.0000 mg | ORAL_CAPSULE | Freq: Every day | ORAL | Status: DC

## 2013-10-27 NOTE — Telephone Encounter (Signed)
Pt called to follow up on the request. Pt also request sample for cymbata, and written rx for cymbalta 30 and 90 days supply. Pt stated need to different one because one will be send out to mail supply and one will be going to local drug store. Please advise

## 2013-10-27 NOTE — Telephone Encounter (Signed)
Pt reports no problems with her GI system; here GYN wanted to know when her Recall is. She will call back for problems. Recall COLON in for 10/2018.

## 2013-10-27 NOTE — Telephone Encounter (Signed)
LMOVM advising of mail order approval, also advised unsure of which local pharmacy due to have 4 on file. I lmovm advising sent to [facility]

## 2013-10-27 NOTE — Telephone Encounter (Signed)
We will consult Dr Katrinka Blazing this week I'll ref Thx

## 2013-10-27 NOTE — Addendum Note (Signed)
Addended by: Rock Nephew T on: 10/27/2013 02:22 PM   Modules accepted: Orders

## 2013-10-29 ENCOUNTER — Other Ambulatory Visit (INDEPENDENT_AMBULATORY_CARE_PROVIDER_SITE_OTHER): Payer: Medicare Other

## 2013-10-29 ENCOUNTER — Encounter: Payer: Self-pay | Admitting: Family Medicine

## 2013-10-29 ENCOUNTER — Ambulatory Visit (INDEPENDENT_AMBULATORY_CARE_PROVIDER_SITE_OTHER): Payer: Medicare Other | Admitting: Family Medicine

## 2013-10-29 VITALS — BP 150/86 | HR 75

## 2013-10-29 DIAGNOSIS — M25579 Pain in unspecified ankle and joints of unspecified foot: Secondary | ICD-10-CM

## 2013-10-29 DIAGNOSIS — M25571 Pain in right ankle and joints of right foot: Secondary | ICD-10-CM

## 2013-10-29 NOTE — Progress Notes (Signed)
I'm seeing this patient by the request  of:  Sonda Primes, MD   CC: Right ankle pain  HPI: Patient is a very pleasant 67 year old female coming in with right ankle pain. Patient states that she hurt it approximately 4 months [place]. Patient complains mostly of pain on the medial aspect of the ankle. States that it seems to be getting worse with ambulation. Had a lot of swelling initially which has completely resolved at this time. Patient describes the pain is more of a dull aching sensation. This does make her walk with a limp. Denies any numbness. Patient rates pain approximately 6/10 but does seem to be improving. Patient has been taking meloxicam for other problems which does seem to relieve some of the pain.  Past medical, surgical, family and social history reviewed. Medications reviewed all in the electronic medical record.   Review of Systems: No headache, visual changes, nausea, vomiting, diarrhea, constipation, dizziness, abdominal pain, skin rash, fevers, chills, night sweats, weight loss, swollen lymph nodes, body aches, joint swelling, muscle aches, chest pain, shortness of breath, mood changes.   Objective:    Blood pressure 150/86, pulse 75, SpO2 98.00%.   General: No apparent distress alert and oriented x3 mood and affect normal, dressed appropriately.  HEENT: Pupils equal, extraocular movements intact Respiratory: Patient's speak in full sentences and does not appear short of breath Cardiovascular: No lower extremity edema, non tender, no erythema Skin: Warm dry intact with no signs of infection or rash on extremities or on axial skeleton. Abdomen: Soft nontender Neuro: Cranial nerves II through XII are intact, neurovascularly intact in all extremities with 2+ DTRs and 2+ pulses. Lymph: No lymphadenopathy of posterior or anterior cervical chain or axillae bilaterally.   MSK: Non tender with full range of motion and good  stability and symmetric strength and tone of shoulders, elbows, wrist, hip, knees bilaterally.  Ankle: Right No visible erythema or swelling. Range of motion is full in all directions. Strength is 5/5 in all directions. Stable lateral and medial ligaments; squeeze test and kleiger test unremarkable; pain with palpation over the medial malleolus at the most distal portion been going a proximal approximately 6 cm. Patient is also very tender to palpation over the posterior tibialis tendon. Talar dome nontender; No pain at base of 5th MT; No tenderness over cuboid; Mild tenderness over N spot or navicular prominence No tenderness over the lateral malleolus No sign of peroneal tendon subluxations or tenderness to palpation Negative tarsal tunnel tinel's Able to walk 4 steps. Patient does ambulate with an antalgic gait but still has good balance and coordination  MSK US performed of: Right ankle This study was ordered, performed, and interpreted by Terrilee Files D.O.  Foot/Ankle:   All structures visualized.   Talar dome unremarkable  Ankle mortise without effusion. Peroneus longus and brevis tendons unremarkable on long and transverse views without sheath effusions. Posterior tibialis, does have significant hypoechoic changes and does appear to have a tear in approximately 30% of the tendon.  flexor hallucis longus, and flexor digitorum longus tendons unremarkable on long and transverse views without sheath effusions. Patient's medial malleolus to shows signs of callus formation on the posterior inferior portion of the bone. In addition this patient does have increasing Doppler flow on the cortex of the bone but no true cortical defect noted. Achilles tendon visualized along length of tendon and unremarkable on long and transverse views without sheath effusion. Anterior  Talofibular Ligament and Calcaneofibular Ligaments unremarkable and intact. Deltoid Ligament unremarkable and intact. Plantar  fascia intact and without effusion, normal thickness. No increased doppler signal, cap sign, or thickening of tibial cortex. Power doppler signal normal.  IMPRESSION: Stress reaction of the medial malleolus with posterior tib tear partial    Impression and Recommendations:     This case required medical decision making of moderate complexity.

## 2013-10-29 NOTE — Patient Instructions (Addendum)
Good to meet you Happy holidays Tylenol 650mg  three times daily can help Ice 20 minutes 2 times a day Wear boot with any activity but driving  Come see me again in 4 weeks.  You do have a stress fracture and will take some time to heal.

## 2013-10-29 NOTE — Assessment & Plan Note (Signed)
Patient had what appeared to be a medial malleolus fracture that has callus over this time. Patient will also has a stress reaction of the medial malleolus with a posterior tibialis tendon and partial tear. Patient was given a Cam Walker to wear at this time. Discussed trying to limit anti-inflammatories to help with bone turnover. Patient will take Tylenol for pain and will start with an icing regimen. I would like to see patient back again though weeks. At that time if she is doing better we'll get her on a home exercise program as well as hopefully transition her back into regular shoes.

## 2013-10-29 NOTE — Progress Notes (Signed)
Pre-visit discussion using our clinic review tool. No additional management support is needed unless otherwise documented below in the visit note.  

## 2013-11-04 ENCOUNTER — Telehealth: Payer: Self-pay | Admitting: Family Medicine

## 2013-11-04 NOTE — Telephone Encounter (Signed)
Pt called stated that Dr. Katrinka Blazing put her on a foot brace, it is hurting her really bad. Please advise.

## 2013-11-04 NOTE — Telephone Encounter (Signed)
Patient will come in tomorrow and we will look at the boot. Patient may do better in a postop shoe. We will just have to look at it tomorrow. Patient will come in about 11:30

## 2013-11-17 ENCOUNTER — Other Ambulatory Visit: Payer: Self-pay | Admitting: *Deleted

## 2013-11-17 MED ORDER — DULOXETINE HCL 60 MG PO CPEP: 60.0000 mg | ORAL_CAPSULE | Freq: Every day | ORAL | Status: DC

## 2013-11-26 ENCOUNTER — Ambulatory Visit (INDEPENDENT_AMBULATORY_CARE_PROVIDER_SITE_OTHER): Payer: Medicare HMO | Admitting: Internal Medicine

## 2013-11-26 ENCOUNTER — Encounter: Payer: Self-pay | Admitting: Internal Medicine

## 2013-11-26 ENCOUNTER — Other Ambulatory Visit: Payer: Self-pay | Admitting: *Deleted

## 2013-11-26 VITALS — BP 120/82 | HR 80 | Temp 98.3°F | Resp 16 | Wt 183.0 lb

## 2013-11-26 DIAGNOSIS — J069 Acute upper respiratory infection, unspecified: Secondary | ICD-10-CM

## 2013-11-26 DIAGNOSIS — E538 Deficiency of other specified B group vitamins: Secondary | ICD-10-CM

## 2013-11-26 DIAGNOSIS — IMO0001 Reserved for inherently not codable concepts without codable children: Secondary | ICD-10-CM

## 2013-11-26 MED ORDER — OMEPRAZOLE 40 MG PO CPDR: 40.0000 mg | DELAYED_RELEASE_CAPSULE | Freq: Two times a day (BID) | ORAL | Status: DC

## 2013-11-26 MED ORDER — MELOXICAM 15 MG PO TABS: 15.0000 mg | ORAL_TABLET | Freq: Every day | ORAL | Status: DC

## 2013-11-26 MED ORDER — PROMETHAZINE-CODEINE 6.25-10 MG/5ML PO SYRP: 5.0000 mL | ORAL_SOLUTION | ORAL | Status: DC | PRN

## 2013-11-26 MED ORDER — LOSARTAN POTASSIUM 100 MG PO TABS: 100.0000 mg | ORAL_TABLET | Freq: Every day | ORAL | Status: DC

## 2013-11-26 MED ORDER — AMOXICILLIN-POT CLAVULANATE 875-125 MG PO TABS: 1.0000 | ORAL_TABLET | Freq: Two times a day (BID) | ORAL | Status: DC

## 2013-11-26 MED ORDER — FLUCONAZOLE 150 MG PO TABS: 150.0000 mg | ORAL_TABLET | Freq: Once | ORAL | Status: DC

## 2013-11-26 MED ORDER — DULOXETINE HCL 60 MG PO CPEP: 60.0000 mg | ORAL_CAPSULE | Freq: Every day | ORAL | Status: DC

## 2013-11-26 MED ORDER — GABAPENTIN 600 MG PO TABS: 600.0000 mg | ORAL_TABLET | Freq: Three times a day (TID) | ORAL | Status: DC

## 2013-11-26 NOTE — Assessment & Plan Note (Signed)
Continue with current prescription therapy as reflected on the Med list.  

## 2013-11-26 NOTE — Progress Notes (Signed)
Subjective:    URI  This is a new problem. The current episode started 1 to 4 weeks ago (3 wks). The problem has been gradually worsening. The maximum temperature recorded prior to her arrival was 100 - 100.9 F. Associated symptoms include coughing, rhinorrhea and sinus pain. Pertinent negatives include no abdominal pain, congestion, dysuria, nausea, neck pain, sneezing or vomiting. The treatment provided no relief.   F/u R ankle pain - better F/u on chronic hypertension that has been well controlled with medicines; to address chronic  hyperlipidemia controlled with medicines as well; and to address anxiety, controlled with medical treatment and diet. F/u TMJ - better  F/u on B12 inj - using    BP Readings from Last 3 Encounters:  11/26/13 120/82  10/29/13 150/86  09/14/13 140/80   Wt Readings from Last 3 Encounters:  11/26/13 183 lb (83.008 kg)  09/14/13 184 lb (83.462 kg)  07/06/13 187 lb (84.823 kg)      Review of Systems  Constitutional: Negative for diaphoresis, activity change, appetite change, fatigue and unexpected weight change.  HENT: Positive for rhinorrhea. Negative for congestion, dental problem, hearing loss, mouth sores, sinus pressure, sneezing and voice change.   Eyes: Negative for pain and visual disturbance.  Respiratory: Positive for cough. Negative for chest tightness and stridor.   Cardiovascular: Positive for leg swelling. Negative for palpitations.  Gastrointestinal: Negative for nausea, vomiting, abdominal pain, blood in stool, abdominal distention and rectal pain.  Genitourinary: Negative for dysuria, hematuria, decreased urine volume, vaginal bleeding, vaginal discharge, difficulty urinating, vaginal pain and menstrual problem.  Musculoskeletal: Negative for back pain, gait problem, joint swelling and neck pain.  Skin: Negative for color change and wound.  Neurological: Negative for dizziness, tremors, syncope, speech difficulty and light-headedness.   Hematological: Negative for adenopathy.  Psychiatric/Behavioral: Negative for suicidal ideas, hallucinations, behavioral problems, confusion, sleep disturbance, dysphoric mood and decreased concentration. The patient is nervous/anxious. The patient is not hyperactive.        Objective:   Physical Exam  Constitutional: She appears well-developed. No distress.  Obese NAD  HENT:  Head: Normocephalic.  Right Ear: External ear normal.  Left Ear: External ear normal.  Nose: Nose normal.  Mouth/Throat: Oropharynx is clear and moist.  Eyes: Conjunctivae are normal. Pupils are equal, round, and reactive to light. Right eye exhibits no discharge. Left eye exhibits no discharge.  Neck: Normal range of motion. Neck supple. No JVD present. No tracheal deviation present. No thyromegaly present.  Cardiovascular: Normal rate, regular rhythm and normal heart sounds.   Pulmonary/Chest: No stridor. No respiratory distress. She has no wheezes.  Abdominal: Soft. Bowel sounds are normal. She exhibits no distension and no mass. There is no tenderness. There is no rebound and no guarding.  Musculoskeletal: She exhibits tenderness (R b. anserina is tender; calf is nl). She exhibits no edema.  TMJ is tender on R  Lymphadenopathy:    She has no cervical adenopathy.  Neurological: She displays normal reflexes. No cranial nerve deficit. She exhibits normal muscle tone. Coordination normal.  Skin: No rash noted. No erythema.  Psychiatric: She has a normal mood and affect. Her behavior is normal. Judgment and thought content normal.  anxious  Lungs CTA HEENT - eryth mucosa Lab Results  Component Value Date   WBC 5.4 12/30/2011   HGB 13.9 12/30/2011   HCT 40.9 12/30/2011   PLT 186 12/30/2011   GLUCOSE 120* 05/18/2013   CHOL 188 10/23/2011   TRIG 94.0 10/23/2011  HDL 51.80 10/23/2011   LDLCALC 117* 10/23/2011   ALT 16 12/30/2011   AST 22 12/30/2011   NA 139 05/18/2013   K 4.5 05/18/2013   CL 106 05/18/2013    CREATININE 1.1 05/18/2013   BUN 22 05/18/2013   CO2 31 05/18/2013   TSH 1.83 01/11/2013   INR 1.0 06/03/2007      CBG 110 fasting     Assessment & Plan:

## 2013-11-26 NOTE — Assessment & Plan Note (Signed)
Augmentin 10 d ENT cons discussed

## 2013-11-26 NOTE — Patient Instructions (Signed)
Use over-the-counter  "cold" medicines  such as  "Afrin" nasal spray for nasal congestion as directed instead. Use" Delsym" or" Robitussin" cough syrup varietis for cough.  You can use plain "Tylenol" or "Advil" for fever, chills and achyness.  Please, make an appointment if you are not better or if you're worse.  

## 2013-11-26 NOTE — Progress Notes (Signed)
Pre visit review using our clinic review tool, if applicable. No additional management support is needed unless otherwise documented below in the visit note. 

## 2013-11-29 ENCOUNTER — Ambulatory Visit: Payer: Medicare Other | Admitting: Family Medicine

## 2013-12-03 ENCOUNTER — Encounter: Payer: Self-pay | Admitting: Family Medicine

## 2013-12-03 ENCOUNTER — Other Ambulatory Visit (INDEPENDENT_AMBULATORY_CARE_PROVIDER_SITE_OTHER): Payer: Medicare HMO

## 2013-12-03 ENCOUNTER — Ambulatory Visit (INDEPENDENT_AMBULATORY_CARE_PROVIDER_SITE_OTHER): Payer: Commercial Managed Care - HMO | Admitting: Family Medicine

## 2013-12-03 ENCOUNTER — Other Ambulatory Visit: Payer: Self-pay | Admitting: *Deleted

## 2013-12-03 VITALS — BP 142/78 | HR 85 | Temp 97.9°F | Resp 16

## 2013-12-03 DIAGNOSIS — M25579 Pain in unspecified ankle and joints of unspecified foot: Secondary | ICD-10-CM

## 2013-12-03 DIAGNOSIS — M76829 Posterior tibial tendinitis, unspecified leg: Secondary | ICD-10-CM

## 2013-12-03 DIAGNOSIS — M25571 Pain in right ankle and joints of right foot: Secondary | ICD-10-CM

## 2013-12-03 MED ORDER — DULOXETINE HCL 60 MG PO CPEP: 60.0000 mg | ORAL_CAPSULE | Freq: Every day | ORAL | Status: DC

## 2013-12-03 NOTE — Patient Instructions (Addendum)
Good to see you You have near healed the stress fracture but still having pain and new tear of posterior tibialis tendon. We did do an injection today.  You can wear a shoe is it helps Increase vitamin D to 4000IU daily.  Continue ice.  I want you to come back in 2 weeks.   Posterior Tibial Tendon Tendinitis with Rehab Tendonitis is a condition that is characterized by inflammation of a tendon or the lining (sheath) that surrounds it. The inflammation is usually caused by damage to the tendon, such as a tendon tear (strain). Sprains are classified into three categories. Grade 1 sprains cause pain, but the tendon is not lengthened. Grade 2 sprains include a lengthened ligament due to the ligament being stretched or partially ruptured. With grade 2 sprains there is still function, although the function may be diminished. Grade 3 sprains are characterized by a complete tear of the tendon or muscle, and function is usually impaired. Posterior tibialis tendonitis is tendonitis of the posterior tibial tendon, which attaches muscles of the lower leg to the foot. The posterior tibial tendon is located in the back of the ankle and helps the body straighten (plantarflex) and rotate inward (medially rotate) the ankle. SYMPTOMS   Pain, tenderness, swelling, warmth, and/or redness over the back of the inner ankle at the posterior tibial tendon or the inner part of the mid-foot.  Pain that worsens with plantarflexion or medial rotation of the ankle.  A crackling sound (crepitation) when the tendon is moved or touched. CAUSES  Posterior tibial tendonitis occurs when damage to the posterior tibial tendon starts an inflammatory response. Common mechanisms of injury include:  Degenerative (occurs with aging) processes that weaken the tendon and make it more susceptible to injury.  Stress placed on the tendon from an increase in the intensity, frequency, or duration of training.  Direct trauma to the  ankle.  Returning to activity before a previous ankle injury is allowed to heal. RISK INCREASES WITH:  Activities that involve repetitive and/or stressful plantarflexion (jumping, kicking, or running up/down hills).  Poor strength and flexibility.  Flat feet.  Previous injury to the foot, ankle, or leg. PREVENTION   Warm up and stretch properly before activity.  Allow for adequate recovery between workouts.  Maintain physical fitness:  Strength, flexibility, and endurance.  Cardiovascular fitness.  Learn and use proper technique. When possible, have a coach correct improper technique.  Complete rehabilitation from a previous foot, ankle, or leg injury.  If you have flat feet, wear arch supports (orthotics). PROGNOSIS  If treated properly, then the symptoms of tendonitis usually resolve within 6 weeks. This period may be shorter for injuries caused by direct trauma. RELATED COMPLICATIONS   Prolonged healing time, if improperly treated or re-injured.  Recurrent symptoms that result in a chronic problem.  Partial or complete tendon tear (rupture) requiring surgery. TREATMENT  Treatment initially involves the use of ice and medication to help reduce pain and inflammation. The use of strengthening and stretching exercises may help reduce pain with activity. These exercises may be performed at home or with referral to a therapist. Often times, your caregiver will recommend immobilizing the ankle to allow the tendon to heal. If you have flat feet, the you may be advised to wear orthotic arch supports. If symptoms persist for greater than 6 months despite non-surgical (conservative) treatment, then surgery may be recommended. MEDICATION   If pain medication is necessary, then nonsteroidal anti-inflammatory medications, such as aspirin and ibuprofen, or  other minor pain relievers, such as acetaminophen, are often recommended.  Do not take pain medication for 7 days before  surgery.  Prescription pain relievers may be given if deemed necessary by your caregiver. Use only as directed and only as much as you need.  Corticosteroid injections may be given by your caregiver. These injections should be reserved for the most serious cases, because they may only be given a certain number of times. HEAT AND COLD  Cold treatment (icing) relieves pain and reduces inflammation. Cold treatment should be applied for 10 to 15 minutes every 2 to 3 hours for inflammation and pain and immediately after any activity that aggravates your symptoms. Use ice packs or massage the area with a piece of ice (ice massage).  Heat treatment may be used prior to performing the stretching and strengthening activities prescribed by your caregiver, physical therapist, or athletic trainer. Use a heat pack or soak the injury in warm water. SEEK MEDICAL CARE IF:  Treatment seems to offer no benefit, or the condition worsens.  Any medications produce adverse side effects. EXERCISES RANGE OF MOTION (ROM) AND STRETCHING EXERCISES - Posterior Tibial Tendon Tendinitis These exercises may help you when beginning to rehabilitate your injury. Your symptoms may resolve with or without further involvement from your physician, physical therapist or athletic trainer. While completing these exercises, remember:   Restoring tissue flexibility helps normal motion to return to the joints. This allows healthier, less painful movement and activity.  An effective stretch should be held for at least 30 seconds.  A stretch should never be painful. You should only feel a gentle lengthening or release in the stretched tissue. RANGE OF MOTION - Ankle Plantar Flexion   Sit with your right / left leg crossed over your opposite knee.  Use your opposite hand to pull the top of your foot and toes toward you.  You should feel a gentle stretch on the top of your foot/ankle. Hold this position for __________  seconds. Repeat __________ times. Complete this exercise __________ times per day.  RANGE OF MOTION - Ankle Eversion   Sit with your right / left ankle crossed over your opposite knee.  Grip your foot with your opposite hand, placing your thumb on the top of your foot and your fingers across the bottom of your foot.  Gently push your foot downward with a slight rotation so your littlest toes rise slightly  You should feel a gentle stretch on the inside of your ankle. Hold the stretch for __________ seconds. Repeat __________ times. Complete this exercise __________ times per day.  RANGE OF MOTION - Ankle Inversion   Sit with your right / left ankle crossed over your opposite knee.  Grip your foot with your opposite hand, placing your thumb on the bottom of your foot and your fingers across the top of your foot.  Gently pull your foot so the smallest toe comes toward you and your thumb pushes the inside of the ball of your foot away from you.  You should feel a gentle stretch on the outside of your ankle. Hold the stretch for __________ seconds. Repeat __________ times. Complete this exercise __________ times per day.  RANGE OF MOTION - Dorsi/Plantar Flexion  While sitting with your right / left knee straight, draw the top of your foot upwards by flexing your ankle. Then reverse the motion, pointing your toes downward.  Hold each position for __________ seconds.  After completing your first set of exercises, repeat this  exercise with your knee bent. Repeat __________ times. Complete this exercise __________ times per day.  RANGE OF MOTION - Ankle Alphabet  Imagine your right / left big toe is a pen.  Keeping your hip and knee still, write out the entire alphabet with your "pen." Make the letters as large as you can without increasing any discomfort. Repeat __________ times. Complete this exercise __________ times per day.  STRETCH - Gastrocsoleus   Sit with your right / left leg  extended. Holding onto both ends of a belt or towel, loop it around the ball of your foot.  Keeping your right / left ankle and foot relaxed and your knee straight, pull your foot and ankle toward you using the belt/towel.  You should feel a gentle stretch behind your calf or knee. Hold this position for __________ seconds. Repeat __________ times. Complete this exercise __________ times per day.  STRETCH  Gastroc, Standing   Place hands on wall.  Extend right / left leg, keeping the front knee somewhat bent.  Slightly point your toes inward on your back foot.  Keeping your right / left heel on the floor and your knee straight, shift your weight toward the wall, not allowing your back to arch.  You should feel a gentle stretch in the right / left calf. Hold this position for __________ seconds. Repeat __________ times. Complete this stretch __________ times per day. STRETCH  Soleus, Standing   Place hands on wall.  Extend right / left leg, keeping the other knee somewhat bent.  Slightly point your toes inward on your back foot.  Keep your right / left heel on the floor, bend your back knee, and slightly shift your weight over the back leg so that you feel a gentle stretch deep in your back calf.  Hold this position for __________ seconds. Repeat __________ times. Complete this stretch __________ times per day. STRENGTHENING EXERCISES - Posterior Tibial Tendon Tendinitis These exercises may help you when beginning to rehabilitate your injury. They may resolve your symptoms with or without further involvement from your physician, physical therapist or athletic trainer. While completing these exercises, remember:   Muscles can gain both the endurance and the strength needed for everyday activities through controlled exercises.  Complete these exercises as instructed by your physician, physical therapist or athletic trainer. Progress the resistance and repetitions only as  guided. STRENGTH - Dorsiflexors  Secure a rubber exercise band/tubing to a fixed object (ie. table, pole) and loop the other end around your right / left foot.  Sit on the floor facing the fixed object. The band/tubing should be slightly tense when your foot is relaxed.  Slowly draw your foot back toward you using your ankle and toes.  Hold this position for __________ seconds. Slowly release the tension in the band and return your foot to the starting position. Repeat __________ times. Complete this exercise __________ times per day.  STRENGTH - Towel Curls  Sit in a chair positioned on a non-carpeted surface.  Place your foot on a towel, keeping your heel on the floor.  Pull the towel toward your heel by only curling your toes. Keep your heel on the floor.  If instructed by your physician, physical therapist or athletic trainer, add ____________________ at the end of the towel. Repeat __________ times. Complete this exercise __________ times per day. STRENGTH - Ankle Eversion   Secure one end of a rubber exercise band/tubing to a fixed object (table, pole). Loop the other end around  your foot just before your toes.  Place your fists between your knees. This will focus your strengthening at your ankle.  Drawing the band/tubing across your opposite foot, slowly, pull your little toe out and up. Make sure the band/tubing is positioned to resist the entire motion.  Hold this position for __________ seconds.  Have your muscles resist the band/tubing as it slowly pulls your foot back to the starting position. Repeat __________ times. Complete this exercise __________ times per day.  STRENGTH - Ankle Inversion   Secure one end of a rubber exercise band/tubing to a fixed object (table, pole). Loop the other end around your foot just before your toes.  Place your fists between your knees. This will focus your strengthening at your ankle.  Slowly, pull your big toe up and in, making  sure the band/tubing is positioned to resist the entire motion.  Hold this position for __________ seconds.  Have your muscles resist the band/tubing as it slowly pulls your foot back to the starting position. Repeat __________ times. Complete this exercises __________ times per day.  Document Released: 11/04/2005 Document Revised: 01/27/2012 Document Reviewed: 02/16/2009 [facility] Patient Information 2014 [place], [place].

## 2013-12-03 NOTE — Progress Notes (Signed)
CC: Right ankle pain follow up  HPI: Patient is coming in for followup of her right ankle pain. Patient was diagnosed with medial malleolus stress fracture as well as posterior tibialis tendinitis. Patient was put in a walking boot but has had some difficulties because it seems to rob from time to time. Patient states that the pain has improved somewhat but still when she stands for long amount of time she can have severe soreness. Patient would like to be further along than she is. Patient denies any new symptoms such as numbness or tingling or any weakness. Patient states that the soreness seems to be now more posterior to the bone and previously stated.  Past medical, surgical, family and social history reviewed. Medications reviewed all in the electronic medical record.   Review of Systems: No headache, visual changes, nausea, vomiting, diarrhea, constipation, dizziness, abdominal pain, skin rash, fevers, chills, night sweats, weight loss, swollen lymph nodes, body aches, joint swelling, muscle aches, chest pain, shortness of breath, mood changes.   Objective:    Blood pressure 142/78, pulse 85, temperature 97.9 F (36.6 C), temperature source Oral, resp. rate 16, SpO2 99.00%.   General: No apparent distress alert and oriented x3 mood and affect normal, dressed appropriately.  HEENT: Pupils equal, extraocular movements intact Respiratory: Patient's speak in full sentences and does not appear short of breath Cardiovascular: No lower extremity edema, non tender, no erythema Skin: Warm dry intact with no signs of infection or rash on extremities or on axial skeleton. Abdomen: Soft nontender Neuro: Cranial nerves II through XII are intact, neurovascularly intact in all extremities with 2+ DTRs and 2+ pulses. Lymph: No lymphadenopathy of posterior or anterior cervical chain or axillae bilaterally.   MSK: Non tender with full range of motion and good stability and symmetric strength and tone of  shoulders, elbows, wrist, hip, knees bilaterally.  Ankle: Right No visible erythema or swelling. Range of motion is full in all directions. Strength is 5/5 in all directions. Stable lateral and medial ligaments; squeeze test and kleiger test unremarkable; pain with palpation over the medial malleolus at the most distal portion been going a proximal approximately 6 cm. Patient is also very tender to palpation over the posterior tibialis tendon. Talar dome nontender; No pain at base of 5th MT; No tenderness over cuboid; Mild tenderness over N spot or navicular prominence No tenderness over the lateral malleolus No sign of peroneal tendon subluxations or tenderness to palpation Negative tarsal tunnel tinel's Able to walk 4 steps. Patient's ambulation is improved from previous exam.  MSK US performed of: Right ankle This study was ordered, performed, and interpreted by Charlann Boxer D.O.  Foot/Ankle:   All structures visualized.   Talar dome unremarkable  Ankle mortise without effusion. Peroneus longus and brevis tendons unremarkable on long and transverse views without sheath effusions. Posterior tibialis, does have significant hypoechoic changes and does appear to have a tear in approximately 30% of the tendon but has not made any improvement.  flexor hallucis longus, and flexor digitorum longus tendons unremarkable on long and transverse views without sheath effusions. Patient's medial malleolus to shows signs of callus formation on the posterior inferior portion of the bone. In addition this patient does have increasing Doppler flow on the cortex of the bone but no true cortical defect noted. There is significant more callus formation and previously seen. Achilles tendon visualized along length of tendon and unremarkable on long and transverse views without sheath effusion. Anterior Talofibular Ligament and Calcaneofibular  Ligaments unremarkable and intact. Deltoid Ligament unremarkable and  intact. Plantar fascia intact and without effusion, normal thickness. No increased doppler signal, cap sign, or thickening of tibial cortex. Power doppler signal normal.  IMPRESSION: Stress reaction of the medial malleolus with no improvement of posterior tendon tear.  After verbal consent patient was prepped with alcohol swabs and under ultrasound guidance patient was given an injection of 1 cc of 0.5% Marcaine and 1 cc of Kenalog 40 mg/dL in the tendon sheath around the posterior tibialis tendon. Patient did have improvement with no blood loss. Band-Aid placed in post injection instructions given.    Impression and Recommendations:     This case required medical decision making of moderate complexity.

## 2013-12-03 NOTE — Assessment & Plan Note (Signed)
Patient did have injection today with some improvement. Patient was given home exercise program to try as well. Patient can and cannot wear the boot if she would like at this time because it seems that the stress fracture that was seen has healed fairly well. Patient will try these interventions and come back in 2-3 weeks. Patient continues to have problems we'll consider making custom orthotics. I would also like to see if we need to further imaging. I would consider x-ray as well as potential CT scan to rule out an occult fracture or navicular fracture that is causing radiation of pain.

## 2013-12-07 ENCOUNTER — Telehealth: Payer: Self-pay | Admitting: *Deleted

## 2013-12-07 NOTE — Telephone Encounter (Signed)
Omeprazole PA is approved until 12/03/2014. See media tab for fax.

## 2013-12-16 ENCOUNTER — Ambulatory Visit: Payer: Medicare Other | Admitting: Internal Medicine

## 2013-12-17 ENCOUNTER — Ambulatory Visit (INDEPENDENT_AMBULATORY_CARE_PROVIDER_SITE_OTHER): Payer: Medicare HMO | Admitting: Family Medicine

## 2013-12-17 ENCOUNTER — Encounter: Payer: Self-pay | Admitting: Family Medicine

## 2013-12-17 VITALS — BP 136/80 | HR 75 | Temp 97.3°F | Resp 16 | Wt 186.1 lb

## 2013-12-17 DIAGNOSIS — M76829 Posterior tibial tendinitis, unspecified leg: Secondary | ICD-10-CM

## 2013-12-17 NOTE — Progress Notes (Signed)
Pre-visit discussion using our clinic review tool. No additional management support is needed unless otherwise documented below in the visit note.  

## 2013-12-17 NOTE — Progress Notes (Signed)
  CC: Right ankle pain follow up  HPI: Patient is coming in for followup of her right ankle pain. Patient was diagnosed with medial malleolus stress fracture as well as posterior tibialis tendinitis. Patient was given an injection at last visit and states that she is approximately 100% better. Patient is no longer wearing the boot and states that she does have some mild discomfort from time to time. Patient did stub her fourth toe approximately 2 weeks ago and states that did have some swelling but it seems to be improving as well. Overall she is happy with the results.  Past medical, surgical, family and social history reviewed. Medications reviewed all in the electronic medical record.   Review of Systems: No headache, visual changes, nausea, vomiting, diarrhea, constipation, dizziness, abdominal pain, skin rash, fevers, chills, night sweats, weight loss, swollen lymph nodes, body aches, joint swelling, muscle aches, chest pain, shortness of breath, mood changes.   Objective:    Blood pressure 136/80, pulse 75, temperature 97.3 F (36.3 C), temperature source Oral, resp. rate 16, weight 186 lb 1.3 oz (84.405 kg), SpO2 94.00%.   General: No apparent distress alert and oriented x3 mood and affect normal, dressed appropriately.  HEENT: Pupils equal, extraocular movements intact Respiratory: Patient's speak in full sentences and does not appear short of breath Cardiovascular: No lower extremity edema, non tender, no erythema Skin: Warm dry intact with no signs of infection or rash on extremities or on axial skeleton. Abdomen: Soft nontender Neuro: Cranial nerves II through XII are intact, neurovascularly intact in all extremities with 2+ DTRs and 2+ pulses. Lymph: No lymphadenopathy of posterior or anterior cervical chain or axillae bilaterally.   MSK: Non tender with full range of motion and good stability and symmetric strength and tone of shoulders, elbows, wrist, hip, knees bilaterally.   Ankle: Right No visible erythema or swelling. Range of motion is full in all directions. Strength is 5/5 in all directions. Stable lateral and medial ligaments; squeeze test and kleiger test unremarkable; pain with palpation o patient is only minimally tender over the posterior tibialis tendon just inferior to the medial malleolus. Talar dome nontender; No pain at base of 5th MT; No tenderness over cuboid; Mild tenderness over N spot or navicular prominence No tenderness over the lateral malleolus No sign of peroneal tendon subluxations or tenderness to palpation Negative tarsal tunnel tinel's Able to walk 4 steps. Patient's ambulation is improved from previous exam.    Impression and Recommendations:     This case required medical decision making of moderate complexity.

## 2013-12-17 NOTE — Patient Instructions (Signed)
Good to see you I am glad you are doing better.  I wish you the best with all of your family.  Continue exercises 3 times a week for 6 weeks.  If you want orthotics come back and make it a 30 minute appointment.  Otherwise see me when you need me.

## 2013-12-17 NOTE — Assessment & Plan Note (Signed)
Patient is doing very well at this time. Encourage her to continue the exercises 3 times a week for the next 6 weeks and continue icing protocol. Patient can follow up on an as-needed basis. Patient does have significant breakdown of the foot and made need custom orthotics in the future.

## 2014-01-25 ENCOUNTER — Telehealth: Payer: Self-pay

## 2014-01-25 NOTE — Telephone Encounter (Signed)
Sounds like this is actually her ankle joint or the ligament, hard to say without seeing  Patient.   Make sure patient is doing exercsies 3 times  A week and icing at the end of the day for 20 minutes.  If not much better by Monday I would want to see her again.  Thank you.

## 2014-01-25 NOTE — Telephone Encounter (Signed)
The patient called and is hoping to discuss some follow up care regarding her appointment with Dr.Smith with the [facility].  Callback [phone]

## 2014-01-25 NOTE — Telephone Encounter (Signed)
Pt called states she is having a different pain in her right foot in between her ankle and the top of the foot.  Please advise

## 2014-01-25 NOTE — Telephone Encounter (Signed)
Left message for pt to return call.

## 2014-02-01 ENCOUNTER — Encounter: Payer: Self-pay | Admitting: Family Medicine

## 2014-02-01 ENCOUNTER — Ambulatory Visit (INDEPENDENT_AMBULATORY_CARE_PROVIDER_SITE_OTHER): Payer: Medicare HMO | Admitting: Family Medicine

## 2014-02-01 ENCOUNTER — Ambulatory Visit (INDEPENDENT_AMBULATORY_CARE_PROVIDER_SITE_OTHER)
Admission: RE | Admit: 2014-02-01 | Discharge: 2014-02-01 | Disposition: A | Discharge status: Home / Self Care | Payer: Medicare HMO | Source: Ambulatory Visit | Attending: Family Medicine | Admitting: Family Medicine

## 2014-02-01 ENCOUNTER — Telehealth: Payer: Self-pay | Admitting: *Deleted

## 2014-02-01 DIAGNOSIS — M79671 Pain in right foot: Secondary | ICD-10-CM

## 2014-02-01 DIAGNOSIS — M25571 Pain in right ankle and joints of right foot: Secondary | ICD-10-CM

## 2014-02-01 DIAGNOSIS — M79609 Pain in unspecified limb: Secondary | ICD-10-CM

## 2014-02-01 DIAGNOSIS — S93409A Sprain of unspecified ligament of unspecified ankle, initial encounter: Secondary | ICD-10-CM

## 2014-02-01 NOTE — Telephone Encounter (Signed)
Discussed with pt, she would like to have an MRI. Order entered.

## 2014-02-01 NOTE — Progress Notes (Signed)
  CC: Right ankle pain recurrent injury.   HPI:  Patient has had a recurrent injury to her right ankle. Patient unfortunately was walking her dog and slipped and rolled her right ankle. Patient unfortunately continues to have pain on the medial aspect the ankle with a posterior tibialis tendinitis has been and unfortunately seems to be on the ball of the foot. Patient denies any significant swelling and has not been wearing the right shoes. Patient has not been doing her exercises tissue is feeling better. Patient states now it is hard because she is walking with a limp. States that the pain is approximately 5/10. No radiation or numbness into the foot itself.  Past medical, surgical, family and social history reviewed. Medications reviewed all in the electronic medical record.   Review of Systems: No headache, visual changes, nausea, vomiting, diarrhea, constipation, dizziness, abdominal pain, skin rash, fevers, chills, night sweats, weight loss, swollen lymph nodes, body aches, joint swelling, muscle aches, chest pain, shortness of breath, mood changes.   Objective:    Vitals reviewed.   General: No apparent distress alert and oriented x3 mood and affect normal, dressed appropriately.  HEENT: Pupils equal, extraocular movements intact Respiratory: Patient's speak in full sentences and does not appear short of breath Cardiovascular: No lower extremity edema, non tender, no erythema Skin: Warm dry intact with no signs of infection or rash on extremities or on axial skeleton. Abdomen: Soft nontender Neuro: Cranial nerves II through XII are intact, neurovascularly intact in all extremities with 2+ DTRs and 2+ pulses. Lymph: No lymphadenopathy of posterior or anterior cervical chain or axillae bilaterally.   MSK: Non tender with full range of motion and good stability and symmetric strength and tone of shoulders, elbows, wrist, hip, knees bilaterally.  Ankle: Right No visible erythema or  swelling. Range of motion is full in all directions. Strength is 5/5 in all directions. Stable lateral and medial ligaments; squeeze test and kleiger test unremarkable; pain with palpation o patient is only minimally tender over the posterior tibialis tendon just inferior to the medial malleolus. Talar dome nontender; No pain at base of 5th MT; No tenderness over cuboid; Mild tenderness over N spot or navicular prominence mild pain over the ATFL No tenderness over the lateral malleolus No sign of peroneal tendon subluxations or tenderness to palpation Negative tarsal tunnel tinel's Able to walk 4 steps. An antalgic gait    Impression and Recommendations:     This case required medical decision making of moderate complexity. Spent greater than 25 minutes with patient face-to-face and had greater than 50% of counseling including as described above in assessment and plan.

## 2014-02-01 NOTE — Telephone Encounter (Signed)
Pt called states she was not able to walk through [employer] today due to radiating foot pain.  Pt states the brace is not effective.  She states something has to be wrong.  Please advise

## 2014-02-01 NOTE — Assessment & Plan Note (Signed)
Patient continues to be noncompliant and re\re sprained her ankle. We'll get x-rays to rule out any fracture as well as to further delineate the amount of arthritis of this ankle joint itself. Patient given exercises and was given a brace was fitted by me today. Patient will wear this with any type of ambulation for the next 2 weeks. The patient come back in 2 weeks and at that time if she continues to have pain we'll make her custom orthotics. If she has significant ankle arthritis we can also consider an intra-articular injection into the mortise.

## 2014-02-01 NOTE — Telephone Encounter (Signed)
Please call [facility] tell her xrays were normal.  May do these options.  1.  Prednisone to decrease swelling for 5 days 2. Come in for orthotics to see if they would help  3. MRI of foot and ankle

## 2014-02-01 NOTE — Patient Instructions (Signed)
Good to see you Get xrays downstairs Ice will help Wear brace daily.  Exercises 3 times a week Come back in 2 weeks and if still in pain will consider orthotics.

## 2014-02-09 ENCOUNTER — Ambulatory Visit: Payer: Commercial Managed Care - HMO | Admitting: Internal Medicine

## 2014-02-15 ENCOUNTER — Ambulatory Visit: Payer: Medicare HMO | Admitting: Family Medicine

## 2014-04-13 ENCOUNTER — Other Ambulatory Visit (INDEPENDENT_AMBULATORY_CARE_PROVIDER_SITE_OTHER): Payer: Commercial Managed Care - HMO

## 2014-04-13 ENCOUNTER — Encounter: Payer: Self-pay | Admitting: Internal Medicine

## 2014-04-13 ENCOUNTER — Ambulatory Visit (INDEPENDENT_AMBULATORY_CARE_PROVIDER_SITE_OTHER): Payer: Commercial Managed Care - HMO | Admitting: Internal Medicine

## 2014-04-13 VITALS — BP 140/92 | HR 68 | Temp 97.8°F | Wt 189.0 lb

## 2014-04-13 DIAGNOSIS — I10 Essential (primary) hypertension: Secondary | ICD-10-CM

## 2014-04-13 DIAGNOSIS — IMO0001 Reserved for inherently not codable concepts without codable children: Secondary | ICD-10-CM

## 2014-04-13 DIAGNOSIS — R7309 Other abnormal glucose: Secondary | ICD-10-CM

## 2014-04-13 DIAGNOSIS — R739 Hyperglycemia, unspecified: Secondary | ICD-10-CM

## 2014-04-13 DIAGNOSIS — E538 Deficiency of other specified B group vitamins: Secondary | ICD-10-CM

## 2014-04-13 LAB — URINALYSIS, ROUTINE W REFLEX MICROSCOPIC
Bilirubin Urine: NEGATIVE
KETONES UR: NEGATIVE
Leukocytes, UA: NEGATIVE
Nitrite: NEGATIVE
Specific Gravity, Urine: 1.02 (ref 1.000–1.030)
TOTAL PROTEIN, URINE-UPE24: NEGATIVE
URINE GLUCOSE: NEGATIVE
UROBILINOGEN UA: 0.2 (ref 0.0–1.0)
pH: 6 (ref 5.0–8.0)

## 2014-04-13 LAB — TSH: TSH: 2.97 u[IU]/mL (ref 0.35–4.50)

## 2014-04-13 LAB — HEPATIC FUNCTION PANEL
ALK PHOS: 74 U/L (ref 39–117)
ALT: 17 U/L (ref 0–35)
AST: 19 U/L (ref 0–37)
Albumin: 3.7 g/dL (ref 3.5–5.2)
BILIRUBIN DIRECT: 0.2 mg/dL (ref 0.0–0.3)
Total Bilirubin: 1.2 mg/dL (ref 0.2–1.2)
Total Protein: 6.6 g/dL (ref 6.0–8.3)

## 2014-04-13 LAB — VITAMIN B12: Vitamin B-12: >1500 pg/mL — ABNORMAL HIGH (ref 211–911)

## 2014-04-13 LAB — HEMOGLOBIN A1C: Hgb A1c MFr Bld: 6 % (ref 4.6–6.5)

## 2014-04-13 LAB — BASIC METABOLIC PANEL
BUN: 20 mg/dL (ref 6–23)
CO2: 28 meq/L (ref 19–32)
Calcium: 9 mg/dL (ref 8.4–10.5)
Chloride: 107 mEq/L (ref 96–112)
Creatinine, Ser: 1 mg/dL (ref 0.4–1.2)
GFR: 58.21 mL/min — AB (ref >60.00)
GLUCOSE: 106 mg/dL — AB (ref 70–99)
Potassium: 4.3 mEq/L (ref 3.5–5.1)
SODIUM: 142 meq/L (ref 135–145)

## 2014-04-13 MED ORDER — METFORMIN HCL 500 MG PO TABS
500.0000 mg | ORAL_TABLET | Freq: Every day | ORAL | Status: DC
Start: 2014-04-13 — End: 2014-06-28

## 2014-04-13 MED ORDER — DULOXETINE HCL 60 MG PO CPEP: 60.0000 mg | ORAL_CAPSULE | Freq: Every day | ORAL | Status: DC

## 2014-04-13 NOTE — Progress Notes (Signed)
   Subjective:    HPI C/o wt gain due to stress eating F/u R ankle pain - better F/u on chronic hypertension that has been well controlled with medicines; to address chronic  hyperlipidemia controlled with medicines as well; and to address anxiety, controlled with medical treatment and diet. F/u TMJ - better  F/u on B12 inj - using    BP Readings from Last 3 Encounters:  04/13/14 140/92  12/17/13 136/80  12/03/13 142/78   Wt Readings from Last 3 Encounters:  04/13/14 189 lb (85.73 kg)  12/17/13 186 lb 1.3 oz (84.405 kg)  11/26/13 183 lb (83.008 kg)      Review of Systems  Constitutional: Negative for diaphoresis, activity change, appetite change, fatigue and unexpected weight change.  HENT: Negative for dental problem, hearing loss, mouth sores, sinus pressure and voice change.   Eyes: Negative for pain and visual disturbance.  Respiratory: Negative for chest tightness and stridor.   Cardiovascular: Positive for leg swelling. Negative for palpitations.  Gastrointestinal: Negative for blood in stool, abdominal distention and rectal pain.  Genitourinary: Negative for hematuria, decreased urine volume, vaginal bleeding, vaginal discharge, difficulty urinating, vaginal pain and menstrual problem.  Musculoskeletal: Negative for back pain, gait problem and joint swelling.  Skin: Negative for color change and wound.  Neurological: Negative for dizziness, tremors, syncope, speech difficulty and light-headedness.  Hematological: Negative for adenopathy.  Psychiatric/Behavioral: Negative for suicidal ideas, hallucinations, behavioral problems, confusion, sleep disturbance, dysphoric mood and decreased concentration. The patient is nervous/anxious. The patient is not hyperactive.        Objective:   Physical Exam  Constitutional: She appears well-developed. No distress.  Obese NAD  HENT:  Head: Normocephalic.  Right Ear: External ear normal.  Left Ear: External ear normal.   Nose: Nose normal.  Mouth/Throat: Oropharynx is clear and moist.  Eyes: Conjunctivae are normal. Pupils are equal, round, and reactive to light. Right eye exhibits no discharge. Left eye exhibits no discharge.  Neck: Normal range of motion. Neck supple. No JVD present. No tracheal deviation present. No thyromegaly present.  Cardiovascular: Normal rate, regular rhythm and normal heart sounds.   Pulmonary/Chest: No stridor. No respiratory distress. She has no wheezes.  Abdominal: Soft. Bowel sounds are normal. She exhibits no distension and no mass. There is no tenderness. There is no rebound and no guarding.  Musculoskeletal: She exhibits tenderness (R b. anserina is tender; calf is nl). She exhibits no edema.  TMJ is tender on R  Lymphadenopathy:    She has no cervical adenopathy.  Neurological: She displays normal reflexes. No cranial nerve deficit. She exhibits normal muscle tone. Coordination normal.  Skin: No rash noted. No erythema.  Psychiatric: She has a normal mood and affect. Her behavior is normal. Judgment and thought content normal.  anxious  Lungs CTA  Lab Results  Component Value Date   WBC 5.4 12/30/2011   HGB 13.9 12/30/2011   HCT 40.9 12/30/2011   PLT 186 12/30/2011   GLUCOSE 120* 05/18/2013   CHOL 188 10/23/2011   TRIG 94.0 10/23/2011   HDL 51.80 10/23/2011   LDLCALC 117* 10/23/2011   ALT 16 12/30/2011   AST 22 12/30/2011   NA 139 05/18/2013   K 4.5 05/18/2013   CL 106 05/18/2013   CREATININE 1.1 05/18/2013   BUN 22 05/18/2013   CO2 31 05/18/2013   TSH 1.83 01/11/2013   INR 1.0 06/03/2007           Assessment & Plan:

## 2014-04-13 NOTE — Assessment & Plan Note (Signed)
Continue with current prescription therapy as reflected on the Med list. Labs  

## 2014-04-13 NOTE — Assessment & Plan Note (Signed)
Continue with current prescription therapy as reflected on the Med list.  

## 2014-04-13 NOTE — Assessment & Plan Note (Signed)
Labs Start Metformin

## 2014-04-13 NOTE — Progress Notes (Signed)
Pre visit review using our clinic review tool, if applicable. No additional management support is needed unless otherwise documented below in the visit note. 

## 2014-06-06 ENCOUNTER — Telehealth: Payer: Self-pay | Admitting: Family Medicine

## 2014-06-06 NOTE — Telephone Encounter (Signed)
Patient fell at [place] yesterday and possibly twisted right ankle . . . . She has some swelling.  She is requesting to be seen soon.  There are no openings today or tomorrow.  Please advise.

## 2014-06-06 NOTE — Telephone Encounter (Signed)
Spoke with pt, scheduled appt for 7.21.15 @ 745a.

## 2014-06-06 NOTE — Telephone Encounter (Signed)
Patient fell at [facility] yesterday and possibly twisted right ankle. . . . She has some swelling.  She is requesting to be seen as soon as possible.  There are no openings today or tomorrow.  Please advise.

## 2014-06-07 ENCOUNTER — Other Ambulatory Visit (INDEPENDENT_AMBULATORY_CARE_PROVIDER_SITE_OTHER): Payer: Commercial Managed Care - HMO

## 2014-06-07 ENCOUNTER — Encounter: Payer: Self-pay | Admitting: Family Medicine

## 2014-06-07 ENCOUNTER — Ambulatory Visit (INDEPENDENT_AMBULATORY_CARE_PROVIDER_SITE_OTHER): Payer: Commercial Managed Care - HMO | Admitting: Family Medicine

## 2014-06-07 ENCOUNTER — Ambulatory Visit (INDEPENDENT_AMBULATORY_CARE_PROVIDER_SITE_OTHER)
Admission: RE | Admit: 2014-06-07 | Discharge: 2014-06-07 | Disposition: A | Discharge status: Home / Self Care | Payer: Commercial Managed Care - HMO | Source: Ambulatory Visit | Attending: Family Medicine | Admitting: Family Medicine

## 2014-06-07 VITALS — BP 144/90 | HR 79 | Ht 67.5 in | Wt 188.0 lb

## 2014-06-07 DIAGNOSIS — M25571 Pain in right ankle and joints of right foot: Secondary | ICD-10-CM

## 2014-06-07 DIAGNOSIS — S8263XA Displaced fracture of lateral malleolus of unspecified fibula, initial encounter for closed fracture: Secondary | ICD-10-CM

## 2014-06-07 DIAGNOSIS — M25579 Pain in unspecified ankle and joints of unspecified foot: Secondary | ICD-10-CM

## 2014-06-07 DIAGNOSIS — S8261XA Displaced fracture of lateral malleolus of right fibula, initial encounter for closed fracture: Secondary | ICD-10-CM

## 2014-06-07 NOTE — Assessment & Plan Note (Signed)
Lateral malleolus avulsion fracture. We discussed icing regimen and patient was put in a Cam Walker today. We discussed coming out of the Cam Walker 2 times daily to move the ankle to avoid any stiffness. We discussed anti-inflammatories and increasing patient's vitamin D. Patient will come back again in 3 weeks for further evaluation and treatment. We'll ultrasound again. X-rays pending.  Spent greater than 25 minutes with patient face-to-face and had greater than 50% of counseling including as described above in assessment and plan.

## 2014-06-07 NOTE — Progress Notes (Signed)
[facility] [address] [place], [place] [address] Phone: [phone] Subjective:     CC: Right ankle pain  NOB:[id number] Claudia [name] is a 68 y.o. female coming in with complaint of right ankle pain. Patient was seen previously for a posterior tib tendinitis. Patient states now that it hurts more about outside of her ankle. Patient 2 days ago did fall and had significant amount of swelling of her lower leg. Patient states he is able to angulate with a lot of pain on the lateral aspect of her ankle. Denies any numbness but states that very sore with any type of weightbearing. Patient has been doing icing and elevation which has been helpful. Patient has not tried any home medications at this time. Patient states that the pain is 7/10 in severity. No nighttime awakening. Patient does not know the reason that she fell.     Past medical history, social, surgical and family history all reviewed in electronic medical record.   Review of Systems: No headache, visual changes, nausea, vomiting, diarrhea, constipation, dizziness, abdominal pain, skin rash, fevers, chills, night sweats, weight loss, swollen lymph nodes, body aches, joint swelling, muscle aches, chest pain, shortness of breath, mood changes.   Objective Blood pressure 144/90, pulse 79, height 5' 7.5" (1.715 m), weight 188 lb (85.276 kg), SpO2 98.00%.  General: No apparent distress alert and oriented x3 mood and affect normal, dressed appropriately.  HEENT: Pupils equal, extraocular movements intact  Respiratory: Patient's speak in full sentences and does not appear short of breath  Cardiovascular: No lower extremity edema, non tender, no erythema  Skin: Warm dry intact with no signs of infection or rash on extremities or on axial skeleton. Patient though does have injury to the anterior aspect of her tibia with scratches from her fall. No signs of infection. Abdomen: Soft nontender  Neuro: Cranial  nerves II through XII are intact, neurovascularly intact in all extremities with 2+ DTRs and 2+ pulses.  Lymph: No lymphadenopathy of posterior or anterior cervical chain or axillae bilaterally.  Gait significant antalgic gait MSK:  Non tender with full range of motion and good stability and symmetric strength and tone of shoulders, elbows, wrist, hip, knee and bilaterally.  Ankle: Right Patient does have swelling mostly over the lateral aspect of the ankle near the fibular. Range of motion is full in all directions. Strength is 4/5 in all directions. Stable lateral and medial ligaments; squeeze test and kleiger test unremarkable; Talar dome nontender; No pain at base of 5th MT; No tenderness over cuboid; No tenderness over N spot or navicular prominence Tender to palpation over posterior aspect of the lateral malleolus No sign of peroneal tendon subluxations or tenderness to palpation Negative tarsal tunnel tinel's Able to walk 4 steps. Contralateral ankle unremarkable  MSK US performed of: Right ankle This study was ordered, performed, and interpreted by Charlann Boxer D.O.  Foot/Ankle:   All structures visualized.   Talar dome unremarkable  Ankle mortise without effusion. Peroneus longus and brevis tendons unremarkable on long and transverse views without sheath effusions. Posterior tibialis, flexor hallucis longus, and flexor digitorum longus tendons unremarkable on long and transverse views without sheath effusions. Achilles tendon visualized along length of tendon and unremarkable on long and transverse views without sheath effusion. Anterior Talofibular Ligament and Calcaneofibular Ligaments unremarkable and intact. Deltoid Ligament unremarkable and intact. Plantar fascia intact and without effusion, normal thickness. No increased doppler signal, cap sign, or thickening of tibial cortex. Patient  though does have what appears to be an avulsion fracture of the lateral malleolus. This  is mild displaced. Significant hypoechoic changes surrounding the area.  IMPRESSION:  Avulsion fracture of the lateral malleolus       Impression and Recommendations:     This case required medical decision making of moderate complexity.

## 2014-06-28 ENCOUNTER — Other Ambulatory Visit: Payer: Commercial Managed Care - HMO

## 2014-06-28 ENCOUNTER — Other Ambulatory Visit: Payer: Self-pay | Admitting: Internal Medicine

## 2014-06-28 ENCOUNTER — Other Ambulatory Visit: Payer: Self-pay | Admitting: *Deleted

## 2014-06-28 ENCOUNTER — Encounter: Payer: Self-pay | Admitting: Family Medicine

## 2014-06-28 ENCOUNTER — Ambulatory Visit (INDEPENDENT_AMBULATORY_CARE_PROVIDER_SITE_OTHER): Payer: Commercial Managed Care - HMO | Admitting: Family Medicine

## 2014-06-28 VITALS — BP 150/86 | HR 79 | Ht 67.5 in | Wt 188.0 lb

## 2014-06-28 DIAGNOSIS — M25571 Pain in right ankle and joints of right foot: Secondary | ICD-10-CM

## 2014-06-28 DIAGNOSIS — S8261XD Displaced fracture of lateral malleolus of right fibula, subsequent encounter for closed fracture with routine healing: Secondary | ICD-10-CM

## 2014-06-28 DIAGNOSIS — S8290XD Unspecified fracture of unspecified lower leg, subsequent encounter for closed fracture with routine healing: Secondary | ICD-10-CM

## 2014-06-28 DIAGNOSIS — M25579 Pain in unspecified ankle and joints of unspecified foot: Secondary | ICD-10-CM

## 2014-06-28 MED ORDER — METFORMIN HCL 500 MG PO TABS: 500.0000 mg | ORAL_TABLET | Freq: Every day | ORAL | Status: DC

## 2014-06-28 NOTE — Progress Notes (Signed)
[facility] [address] [place], [place] [address] Phone: [phone] Subjective:     CC: Right ankle pain follow up  ION:[id number] Claudia [name] is a 68 y.o. female coming in with complaint of right ankle pain. Patient was seen 3 weeks ago and did have a small avulsion fracture of the lateral malleolus. Patient is been in a [employer], doing home exercises 2 times daily, as well as icing. Patient has noticed significant improvement. Patient states that she is even walking barefoot in the house and has done considerably well. Denies any radiation of pain or any numbness. Overall doing much better.     Past medical history, social, surgical and family history all reviewed in electronic medical record.   Review of Systems: No headache, visual changes, nausea, vomiting, diarrhea, constipation, dizziness, abdominal pain, skin rash, fevers, chills, night sweats, weight loss, swollen lymph nodes, body aches, joint swelling, muscle aches, chest pain, shortness of breath, mood changes.   Objective Blood pressure 150/86, pulse 79, height 5' 7.5" (1.715 m), weight 188 lb (85.276 kg), SpO2 98.00%.  General: No apparent distress alert and oriented x3 mood and affect normal, dressed appropriately.  HEENT: Pupils equal, extraocular movements intact  Respiratory: Patient's speak in full sentences and does not appear short of breath  Cardiovascular: No lower extremity edema, non tender, no erythema  Skin: Warm dry intact with no signs of infection or rash on extremities or on axial skeleton. Patient though does have injury to the anterior aspect of her tibia with scratches from her fall. No signs of infection. Abdomen: Soft nontender  Neuro: Cranial nerves II through XII are intact, neurovascularly intact in all extremities with 2+ DTRs and 2+ pulses.  Lymph: No lymphadenopathy of posterior or anterior cervical chain or axillae bilaterally.  Gait significant antalgic  gait MSK:  Non tender with full range of motion and good stability and symmetric strength and tone of shoulders, elbows, wrist, hip, knee and bilaterally.  Ankle: Right Minimal swelling noted on the lateral aspect of the ankle. Significant improvement from previous exam Range of motion is full in all directions. Strength is 5/5 in all directions. Stable lateral and medial ligaments; squeeze test and kleiger test unremarkable; Talar dome nontender; No pain at base of 5th MT; No tenderness over cuboid; No tenderness over N spot or navicular prominence Minimal tenderness over the lateral inferior aspect of the fibula. No sign of peroneal tendon subluxations or tenderness to palpation Negative tarsal tunnel tinel's Able to walk 4 steps. Contralateral ankle unremarkable  MSK US performed of: Right ankle This study was ordered, performed, and interpreted by Charlann Boxer D.O.  Foot/Ankle:   All structures visualized.   Talar dome unremarkable  Ankle mortise without effusion. Peroneus longus and brevis tendons unremarkable on long and transverse views without sheath effusions. Posterior tibialis, flexor hallucis longus, and flexor digitorum longus tendons unremarkable on long and transverse views without sheath effusions. Achilles tendon visualized along length of tendon and unremarkable on long and transverse views without sheath effusion. Anterior Talofibular Ligament and Calcaneofibular Ligaments unremarkable and intact. Deltoid Ligament unremarkable and intact. Plantar fascia intact and without effusion, normal thickness. No increased doppler signal, cap sign, or thickening of tibial cortex. Patient's avulsion fracture is still noted. Patient does have some mild increased callus formation and does have some cortical thickening. There is increasing Doppler flow in the area.  IMPRESSION:  Avulsion fracture of the lateral malleolus healing  Impression and Recommendations:      This case required medical decision making of moderate complexity.

## 2014-06-28 NOTE — Patient Instructions (Signed)
Good to see you You are doing great! Ice baths are good but can just do icing now Continue the boot for another 2 weeks.  OK in the house to use a sandal from time to time.  Continue to come out of the boot 2 times a day and move ankle.  Come back in 3 weeks and you should be great by then.

## 2014-06-28 NOTE — Assessment & Plan Note (Signed)
Patient is healing relatively well. Discuss continuing the icing as well as the range of motion exercises. Patient will continue the Cam Walker for another 2 weeks and transition into regular shoes. Patient will start with more strengthening exercises that I think will be beneficial. Patient will follow up with me again in 2-3 weeks. At that time if we see bone healing on ultrasound we'll be able to palpation increase activity as tolerated.

## 2014-07-13 ENCOUNTER — Encounter: Payer: Self-pay | Admitting: Internal Medicine

## 2014-07-13 ENCOUNTER — Ambulatory Visit (INDEPENDENT_AMBULATORY_CARE_PROVIDER_SITE_OTHER): Payer: Commercial Managed Care - HMO

## 2014-07-13 ENCOUNTER — Ambulatory Visit (INDEPENDENT_AMBULATORY_CARE_PROVIDER_SITE_OTHER): Payer: Commercial Managed Care - HMO | Admitting: Internal Medicine

## 2014-07-13 VITALS — BP 120/80 | HR 70 | Temp 97.9°F | Wt 191.0 lb

## 2014-07-13 DIAGNOSIS — S8261XS Displaced fracture of lateral malleolus of right fibula, sequela: Secondary | ICD-10-CM

## 2014-07-13 DIAGNOSIS — E538 Deficiency of other specified B group vitamins: Secondary | ICD-10-CM

## 2014-07-13 DIAGNOSIS — R739 Hyperglycemia, unspecified: Secondary | ICD-10-CM

## 2014-07-13 DIAGNOSIS — I10 Essential (primary) hypertension: Secondary | ICD-10-CM

## 2014-07-13 DIAGNOSIS — R7309 Other abnormal glucose: Secondary | ICD-10-CM

## 2014-07-13 DIAGNOSIS — R350 Frequency of micturition: Secondary | ICD-10-CM

## 2014-07-13 DIAGNOSIS — F329 Major depressive disorder, single episode, unspecified: Secondary | ICD-10-CM

## 2014-07-13 DIAGNOSIS — R635 Abnormal weight gain: Secondary | ICD-10-CM

## 2014-07-13 DIAGNOSIS — S8290XS Unspecified fracture of unspecified lower leg, sequela: Secondary | ICD-10-CM

## 2014-07-13 DIAGNOSIS — F3289 Other specified depressive episodes: Secondary | ICD-10-CM

## 2014-07-13 LAB — BASIC METABOLIC PANEL
BUN: 21 mg/dL (ref 6–23)
CO2: 25 mEq/L (ref 19–32)
CREATININE: 1 mg/dL (ref 0.4–1.2)
Calcium: 9 mg/dL (ref 8.4–10.5)
Chloride: 106 mEq/L (ref 96–112)
GFR: 60.98 mL/min (ref >60.00)
Glucose, Bld: 128 mg/dL — ABNORMAL HIGH (ref 70–99)
Potassium: 4 mEq/L (ref 3.5–5.1)
Sodium: 141 mEq/L (ref 135–145)

## 2014-07-13 LAB — HEMOGLOBIN A1C: Hgb A1c MFr Bld: 5.9 % (ref 4.6–6.5)

## 2014-07-13 NOTE — Assessment & Plan Note (Signed)
Continue with current prescription therapy as reflected on the Med list.  

## 2014-07-13 NOTE — Progress Notes (Signed)
Pre visit review using our clinic review tool, if applicable. No additional management support is needed unless otherwise documented below in the visit note. 

## 2014-07-13 NOTE — Progress Notes (Signed)
Subjective:    HPI   F/u wt gain due to stress eating F/u R ankle pain - did have a small avulsion fracture of the lateral malleolus 1+ mo ago (Dr Tamala Julian)  F/u on chronic hypertension that has been well controlled with medicines; to address chronic  hyperlipidemia controlled with medicines as well; and to address anxiety, controlled with medical treatment and diet. F/u TMJ - better  F/u on B12 inj - using    BP Readings from Last 3 Encounters:  07/13/14 120/80  06/28/14 150/86  06/07/14 144/90   Wt Readings from Last 3 Encounters:  07/13/14 191 lb (86.637 kg)  06/28/14 188 lb (85.276 kg)  06/07/14 188 lb (85.276 kg)      Review of Systems  Constitutional: Negative for diaphoresis, activity change, appetite change, fatigue and unexpected weight change.  HENT: Negative for dental problem, hearing loss, mouth sores, sinus pressure and voice change.   Eyes: Negative for pain and visual disturbance.  Respiratory: Negative for chest tightness and stridor.   Cardiovascular: Positive for leg swelling. Negative for palpitations.  Gastrointestinal: Negative for blood in stool, abdominal distention and rectal pain.  Genitourinary: Negative for hematuria, decreased urine volume, vaginal bleeding, vaginal discharge, difficulty urinating, vaginal pain and menstrual problem.  Musculoskeletal: Negative for back pain, gait problem and joint swelling.  Skin: Negative for color change and wound.  Neurological: Negative for dizziness, tremors, syncope, speech difficulty and light-headedness.  Hematological: Negative for adenopathy.  Psychiatric/Behavioral: Negative for suicidal ideas, hallucinations, behavioral problems, confusion, sleep disturbance, dysphoric mood and decreased concentration. The patient is nervous/anxious. The patient is not hyperactive.        Objective:   Physical Exam  Constitutional: She appears well-developed. No distress.  Obese NAD  HENT:  Head:  Normocephalic.  Right Ear: External ear normal.  Left Ear: External ear normal.  Nose: Nose normal.  Mouth/Throat: Oropharynx is clear and moist.  Eyes: Conjunctivae are normal. Pupils are equal, round, and reactive to light. Right eye exhibits no discharge. Left eye exhibits no discharge.  Neck: Normal range of motion. Neck supple. No JVD present. No tracheal deviation present. No thyromegaly present.  Cardiovascular: Normal rate, regular rhythm and normal heart sounds.   Pulmonary/Chest: No stridor. No respiratory distress. She has no wheezes.  Abdominal: Soft. Bowel sounds are normal. She exhibits no distension and no mass. There is no tenderness. There is no rebound and no guarding.  Musculoskeletal: She exhibits tenderness (R b. anserina is tender; calf is nl). She exhibits no edema.  TMJ is tender on R R ankle is tender  Lymphadenopathy:    She has no cervical adenopathy.  Neurological: She displays normal reflexes. No cranial nerve deficit. She exhibits normal muscle tone. Coordination normal.  Skin: No rash noted. No erythema.  Psychiatric: She has a normal mood and affect. Her behavior is normal. Judgment and thought content normal.  anxious  Lungs CTA  Lab Results  Component Value Date   WBC 5.4 12/30/2011   HGB 13.9 12/30/2011   HCT 40.9 12/30/2011   PLT 186 12/30/2011   GLUCOSE 106* 04/13/2014   CHOL 188 10/23/2011   TRIG 94.0 10/23/2011   HDL 51.80 10/23/2011   LDLCALC 117* 10/23/2011   ALT 17 04/13/2014   AST 19 04/13/2014   NA 142 04/13/2014   K 4.3 04/13/2014   CL 107 04/13/2014   CREATININE 1.0 04/13/2014   BUN 20 04/13/2014   CO2 28 04/13/2014   TSH 2.97 04/13/2014  INR 1.0 06/03/2007   HGBA1C 6.0 04/13/2014           Assessment & Plan:

## 2014-07-13 NOTE — Assessment & Plan Note (Signed)
BDS 

## 2014-07-13 NOTE — Assessment & Plan Note (Signed)
Can try Z plan diet

## 2014-07-19 ENCOUNTER — Ambulatory Visit (INDEPENDENT_AMBULATORY_CARE_PROVIDER_SITE_OTHER): Payer: Commercial Managed Care - HMO | Admitting: Family Medicine

## 2014-07-19 ENCOUNTER — Encounter: Payer: Self-pay | Admitting: Family Medicine

## 2014-07-19 ENCOUNTER — Ambulatory Visit (INDEPENDENT_AMBULATORY_CARE_PROVIDER_SITE_OTHER)
Admission: RE | Admit: 2014-07-19 | Discharge: 2014-07-19 | Disposition: A | Discharge status: Home / Self Care | Payer: Commercial Managed Care - HMO | Source: Ambulatory Visit | Attending: Internal Medicine | Admitting: Internal Medicine

## 2014-07-19 VITALS — BP 140/76 | HR 75 | Ht 67.5 in | Wt 191.0 lb

## 2014-07-19 DIAGNOSIS — M76821 Posterior tibial tendinitis, right leg: Secondary | ICD-10-CM

## 2014-07-19 DIAGNOSIS — S8261XS Displaced fracture of lateral malleolus of right fibula, sequela: Secondary | ICD-10-CM

## 2014-07-19 DIAGNOSIS — Z13820 Encounter for screening for osteoporosis: Secondary | ICD-10-CM

## 2014-07-19 DIAGNOSIS — S8290XS Unspecified fracture of unspecified lower leg, sequela: Secondary | ICD-10-CM

## 2014-07-19 DIAGNOSIS — M76829 Posterior tibial tendinitis, unspecified leg: Secondary | ICD-10-CM

## 2014-07-19 LAB — URINALYSIS, ROUTINE W REFLEX MICROSCOPIC
Bilirubin Urine: NEGATIVE
Ketones, ur: NEGATIVE
NITRITE: NEGATIVE
PH: 6 (ref 5.0–8.0)
Specific Gravity, Urine: 1.01 (ref 1.000–1.030)
TOTAL PROTEIN, URINE-UPE24: NEGATIVE
URINE GLUCOSE: NEGATIVE
Urobilinogen, UA: 0.2 (ref 0.0–1.0)

## 2014-07-19 MED ORDER — DICLOFENAC SODIUM 2 % TD SOLN: TRANSDERMAL | Status: DC

## 2014-07-19 NOTE — Assessment & Plan Note (Signed)
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 I do think that custom orthotics will be necessary in the long run. Patient come back in 3 weeks and then continuing to have some difficulty we'll consider this.

## 2014-07-19 NOTE — Progress Notes (Signed)
[facility] [address] [place], [place] [address] Phone: [phone] Subjective:     CC: Right ankle pain follow up  BDZ:[id number] Claudia [name] is a 68 y.o. female coming in with complaint of right ankle pain. Patient is being followed up for lateral avulsion fracture. Patient was in the [facility] for a total of 5 weeks. Patient was supposed to be in a regular shoe for the last week. Patient was continue with range of motion exercises, icing, and topical anti-inflammatories. Patient states overall she is doing better. Patient has some soreness when she does wear the shoes a regular basis. Patient states that the soreness seems to be more on the medial as well as the lateral aspects of the ankle. Mild foot discomfort as well. Patient has though gotten new shoes which he has found to be beneficial. Patient is happy overall. Patient denies any significant new symptoms.     Past medical history, social, surgical and family history all reviewed in electronic medical record.   Review of Systems: No headache, visual changes, nausea, vomiting, diarrhea, constipation, dizziness, abdominal pain, skin rash, fevers, chills, night sweats, weight loss, swollen lymph nodes, body aches, joint swelling, muscle aches, chest pain, shortness of breath, mood changes.   Objective Blood pressure 140/76, pulse 75, height 5' 7.5" (1.715 m), weight 191 lb (86.637 kg), SpO2 97.00%.  General: No apparent distress alert and oriented x3 mood and affect normal, dressed appropriately.  HEENT: Pupils equal, extraocular movements intact  Respiratory: Patient's speak in full sentences and does not appear short of breath  Cardiovascular: No lower extremity edema, non tender, no erythema  Skin: Warm dry intact with no signs of infection or rash on extremities or on axial skeleton. Patient though does have injury to the anterior aspect of her tibia with scratches from her fall. No signs of  infection. Abdomen: Soft nontender  Neuro: Cranial nerves II through XII are intact, neurovascularly intact in all extremities with 2+ DTRs and 2+ pulses.  Lymph: No lymphadenopathy of posterior or anterior cervical chain or axillae bilaterally.  Gait significant antalgic gait MSK:  Non tender with full range of motion and good stability and symmetric strength and tone of shoulders, elbows, wrist, hip, knee and bilaterally.  Ankle: Right Minimal swelling noted on the lateral aspect of the ankle. Significant improvement from previous exam Range of motion is full in all directions. Strength is 5/5 in all directions. Stable lateral and medial ligaments; squeeze test and kleiger test unremarkable; Talar dome nontender; No pain at base of 5th MT; No tenderness over cuboid; No tenderness over N spot or navicular prominence Nontender on exam No sign of peroneal tendon subluxations or tenderness to palpation Negative tarsal tunnel tinel's Able to walk 4 steps. Contralateral ankle unremarkable  MSK US performed of: Right ankle This study was ordered, performed, and interpreted by Charlann Boxer D.O.  Foot/Ankle:   All structures visualized.   Talar dome unremarkable  Ankle mortise without effusion. Peroneus longus and brevis tendons unremarkable on long and transverse views without sheath effusions. Posterior tibialis tendon does have hypoechoic changes, flexor hallucis longus, and flexor digitorum longus tendons unremarkable on long and transverse views without sheath effusions. Achilles tendon visualized along length of tendon and unremarkable on long and transverse views without sheath effusion. Anterior Talofibular Ligament and Calcaneofibular Ligaments unremarkable and intact. Deltoid Ligament unremarkable and intact. Plantar fascia intact and without effusion, normal thickness. No increased doppler signal, cap sign, or thickening  of tibial cortex. Patient's avulsion fracture is completely  healed at this time.  IMPRESSION:  Avulsion fracture healed       Impression and Recommendations:     This case required medical decision making of moderate complexity.

## 2014-07-19 NOTE — Assessment & Plan Note (Signed)
Discussed with patient again at great length. Patient was recommended to go to formal physical therapy but patient declined. Patient is going to work with her son-in-law who is a Transport planner. Patient was given a handout insure proper technique of multiple different exercises. We also discussed continuing the icing. Patient will continue with the shoes we discussed that custom orthotics could be beneficial. Patient will followup again in 3 weeks and at that time we may consider custom orthotics.  Spent greater than 25 minutes with patient face-to-face and had greater than 50% of counseling including as described above in assessment and plan.

## 2014-07-19 NOTE — Patient Instructions (Signed)
It is good to see you as always.  You are doing great.  Let your son-in-law work you over now.  OK to wear shoes.  Calcium 1000mg  daily or 3 times a week could help.  Continue the vitamin D at 2000 IU daily now.  Good luck with bone scan.  If not perfect come back in 3 weeks.

## 2014-07-29 ENCOUNTER — Telehealth: Payer: Self-pay | Admitting: Internal Medicine

## 2014-07-29 NOTE — Telephone Encounter (Signed)
Patient would like a call back with regards to what her A1C is.

## 2014-07-29 NOTE — Telephone Encounter (Signed)
Pt has been contacted by stacey & she gave a1c result...Claudia [name]

## 2014-08-08 ENCOUNTER — Encounter: Payer: Self-pay | Admitting: Family Medicine

## 2014-08-08 ENCOUNTER — Ambulatory Visit (INDEPENDENT_AMBULATORY_CARE_PROVIDER_SITE_OTHER): Payer: Commercial Managed Care - HMO | Admitting: Family Medicine

## 2014-08-08 VITALS — BP 140/84 | HR 72 | Ht 67.5 in | Wt 188.0 lb

## 2014-08-08 DIAGNOSIS — M2142 Flat foot [pes planus] (acquired), left foot: Secondary | ICD-10-CM | POA: Insufficient documentation

## 2014-08-08 DIAGNOSIS — M2140 Flat foot [pes planus] (acquired), unspecified foot: Secondary | ICD-10-CM

## 2014-08-08 DIAGNOSIS — M2141 Flat foot [pes planus] (acquired), right foot: Secondary | ICD-10-CM | POA: Insufficient documentation

## 2014-08-08 NOTE — Patient Instructions (Addendum)
It is good to see you Claudia [name] is your friend when you need it.  We are making you custom orthotics.  Wear them 2 hours the first day and increase 1 hour daily thereafter until full day.  After 2 weeks call us and tell us how you are dong.  If pain is not much better we will get an MRI.  If we get an MRi then we wil have you come in 1-2 days after and go over the results

## 2014-08-08 NOTE — Assessment & Plan Note (Signed)
Patient severe pes planus bilaterally he is likely contributing to her posterior tibialis tenderness. Patient given a home exercise program as well as continuing the other conservative therapy including over-the-counter medications in the topical anti-inflammatories. Patient did have custom orthotics made for her today. We discussed wearing them slowly and showed proper technique him inserted and switching to different shoes. We warned that we are going to slowly increase the duration of the course of the next 2 weeks. Patient and will come back and see me again in approximately 2 weeks. At that time is continuing to have pain we'll consider further imaging of patient's ankle.

## 2014-08-08 NOTE — Progress Notes (Signed)
[facility] [address] [place], [place] [address] Phone: [phone] Subjective:     CC: Right ankle pain follow up  SEG:[id number] Claudia [name] is a 68 y.o. female coming in with complaint of right ankle pain. Patient is being followed up for lateral avulsion fracture. Patient has started wearing shoes a regular basis. Patient was to take over-the-counter medications and was working with her son-in-law who is a therapist for range of motion exercising. Patient did have a bone scan showing the patient has good bone mineral density. Patient states that she has improved again but continues to have pain more on the right ankle on the inside. Patient states that the outside lateral aspect of the ankle is significantly better. Has not been icing as regularly. Has been wearing a shoe at all times and it has not had to go back into the boot. No new symptoms just exacerbation of the previous problem which patient was diagnosed with a posterior tibialis tendinitis.     Past medical history, social, surgical and family history all reviewed in electronic medical record.   Review of Systems: No headache, visual changes, nausea, vomiting, diarrhea, constipation, dizziness, abdominal pain, skin rash, fevers, chills, night sweats, weight loss, swollen lymph nodes, body aches, joint swelling, muscle aches, chest pain, shortness of breath, mood changes.   Objective Blood pressure 140/84, pulse 72, height 5' 7.5" (1.715 m), weight 188 lb (85.276 kg), SpO2 97.00%.  General: No apparent distress alert and oriented x3 mood and affect normal, dressed appropriately.  HEENT: Pupils equal, extraocular movements intact  Respiratory: Patient's speak in full sentences and does not appear short of breath  Cardiovascular: No lower extremity edema, non tender, no erythema  Skin: Warm dry intact with no signs of infection or rash on extremities or on axial skeleton. Patient though does  have injury to the anterior aspect of her tibia with scratches from her fall. No signs of infection. Abdomen: Soft nontender  Neuro: Cranial nerves II through XII are intact, neurovascularly intact in all extremities with 2+ DTRs and 2+ pulses.  Lymph: No lymphadenopathy of posterior or anterior cervical chain or axillae bilaterally.  Gait significant antalgic gait MSK:  Non tender with full range of motion and good stability and symmetric strength and tone of shoulders, elbows, wrist, hip, knee and bilaterally.  Ankle: Right Minimal swelling noted on the lateral aspect of the ankle. Significant improvement from previous exam Range of motion is full in all directions. Strength is 5/5 in all directions. Stable lateral and medial ligaments; squeeze test and kleiger test unremarkable; Talar dome nontender; No pain at base of 5th MT; No tenderness over cuboid; No tenderness over N spot or navicular prominence Nontender on exam No sign of peroneal tendon subluxations or tenderness to palpation Negative tarsal tunnel tinel's Able to walk 4 steps. Contralateral ankle unremarkable  Foot exam shows the patient does have severe pes planus bilaterally. With mild fibular deviation of the large toes bilaterally right greater than left. Patient is has hammertoes of the second and third toes on the right side.   Patient was fitted for a : standard, cushioned, semi-rigid orthotic. The orthotic was heated and afterward the patient stood on the orthotic blank positioned on the orthotic stand. The patient was positioned in subtalar neutral position and 10 degrees of ankle dorsiflexion in a weight bearing stance. After completion of molding, a stable base was applied to the orthotic blank. The blank was ground to a  stable position for weight bearing. Size: 9 Base: [employer] and Padding: None The patient ambulated these, and they were very comfortable.  I spent 45 minutes with this patient,  greater than 50% was face-to-face time counseling regarding the below diagnosis.     Impression and Recommendations:

## 2014-08-12 ENCOUNTER — Telehealth: Payer: Self-pay | Admitting: Family Medicine

## 2014-08-12 NOTE — Telephone Encounter (Signed)
Patient states inserts are not fitting in her shoes.  Please call back.

## 2014-08-12 NOTE — Telephone Encounter (Signed)
Spoke to pt, she's going to stop by this afternoon so we can fix them.

## 2014-09-26 ENCOUNTER — Telehealth: Payer: Self-pay | Admitting: Internal Medicine

## 2014-09-26 NOTE — Telephone Encounter (Signed)
Patients mail order pharmacy ([facility]) is requesting 90 day supply of omeprazole to be sent in.  Patient is currently out.  Patient is requesting phone call in regards.

## 2014-09-27 ENCOUNTER — Other Ambulatory Visit: Payer: Self-pay

## 2014-09-27 MED ORDER — OMEPRAZOLE 40 MG PO CPDR: 40.0000 mg | DELAYED_RELEASE_CAPSULE | Freq: Two times a day (BID) | ORAL | Status: DC

## 2014-10-01 ENCOUNTER — Other Ambulatory Visit: Payer: Self-pay | Admitting: Internal Medicine

## 2014-10-03 NOTE — Telephone Encounter (Signed)
Done

## 2014-10-20 ENCOUNTER — Telehealth: Payer: Self-pay | Admitting: Internal Medicine

## 2014-10-20 NOTE — Telephone Encounter (Signed)
Pt called and has a couple question for Dr Tamala Julian. Need to  See if she can possibly get a handicap tag.    Best number [phone]

## 2014-10-20 NOTE — Telephone Encounter (Signed)
Spoke to pt, made her aware the handicap paper is at the front desk ready to be picked up.

## 2014-10-21 ENCOUNTER — Other Ambulatory Visit: Payer: Self-pay | Admitting: *Deleted

## 2014-10-21 MED ORDER — LORATADINE 10 MG PO TABS: 10.0000 mg | ORAL_TABLET | Freq: Every day | ORAL | Status: DC

## 2014-10-21 MED ORDER — MELOXICAM 15 MG PO TABS: 15.0000 mg | ORAL_TABLET | Freq: Every day | ORAL | Status: DC

## 2014-11-02 ENCOUNTER — Encounter: Payer: Self-pay | Admitting: Internal Medicine

## 2014-11-02 ENCOUNTER — Ambulatory Visit (INDEPENDENT_AMBULATORY_CARE_PROVIDER_SITE_OTHER): Payer: Commercial Managed Care - HMO | Admitting: Internal Medicine

## 2014-11-02 ENCOUNTER — Other Ambulatory Visit (INDEPENDENT_AMBULATORY_CARE_PROVIDER_SITE_OTHER): Payer: Commercial Managed Care - HMO

## 2014-11-02 VITALS — BP 138/78 | HR 76 | Temp 97.9°F | Ht 67.5 in | Wt 189.0 lb

## 2014-11-02 DIAGNOSIS — E538 Deficiency of other specified B group vitamins: Secondary | ICD-10-CM

## 2014-11-02 DIAGNOSIS — F329 Major depressive disorder, single episode, unspecified: Secondary | ICD-10-CM

## 2014-11-02 DIAGNOSIS — R413 Other amnesia: Secondary | ICD-10-CM | POA: Insufficient documentation

## 2014-11-02 DIAGNOSIS — I10 Essential (primary) hypertension: Secondary | ICD-10-CM

## 2014-11-02 DIAGNOSIS — F32A Depression, unspecified: Secondary | ICD-10-CM

## 2014-11-02 DIAGNOSIS — IMO0001 Reserved for inherently not codable concepts without codable children: Secondary | ICD-10-CM

## 2014-11-02 DIAGNOSIS — Z0000 Encounter for general adult medical examination without abnormal findings: Secondary | ICD-10-CM

## 2014-11-02 DIAGNOSIS — M791 Myalgia: Secondary | ICD-10-CM

## 2014-11-02 DIAGNOSIS — R6889 Other general symptoms and signs: Secondary | ICD-10-CM

## 2014-11-02 DIAGNOSIS — M609 Myositis, unspecified: Secondary | ICD-10-CM

## 2014-11-02 DIAGNOSIS — Z23 Encounter for immunization: Secondary | ICD-10-CM

## 2014-11-02 LAB — URINALYSIS, ROUTINE W REFLEX MICROSCOPIC
BILIRUBIN URINE: NEGATIVE
Ketones, ur: NEGATIVE
Nitrite: NEGATIVE
Specific Gravity, Urine: 1.025 (ref 1.000–1.030)
Total Protein, Urine: NEGATIVE
URINE GLUCOSE: NEGATIVE
Urobilinogen, UA: 0.2 (ref 0.0–1.0)
pH: 5.5 (ref 5.0–8.0)

## 2014-11-02 LAB — CBC WITH DIFFERENTIAL/PLATELET
Basophils Absolute: 0 10*3/uL (ref 0.0–0.1)
Basophils Relative: 0.4 % (ref 0.0–3.0)
EOS ABS: 0.1 10*3/uL (ref 0.0–0.7)
Eosinophils Relative: 1.5 % (ref 0.0–5.0)
HCT: 44.1 % (ref 36.0–46.0)
Hemoglobin: 14.4 g/dL (ref 12.0–15.0)
Lymphocytes Relative: 26 % (ref 12.0–46.0)
Lymphs Abs: 1.9 10*3/uL (ref 0.7–4.0)
MCHC: 32.8 g/dL (ref 30.0–36.0)
MCV: 91.5 fl (ref 78.0–100.0)
Monocytes Absolute: 0.3 10*3/uL (ref 0.1–1.0)
Monocytes Relative: 4.1 % (ref 3.0–12.0)
NEUTROS PCT: 68 % (ref 43.0–77.0)
Neutro Abs: 4.9 10*3/uL (ref 1.4–7.7)
Platelets: 298 10*3/uL (ref 150.0–400.0)
RBC: 4.81 Mil/uL (ref 3.87–5.11)
RDW: 13.4 % (ref 11.5–15.5)
WBC: 7.3 10*3/uL (ref 4.0–10.5)

## 2014-11-02 LAB — HEPATIC FUNCTION PANEL
ALK PHOS: 77 U/L (ref 39–117)
ALT: 18 U/L (ref 0–35)
AST: 27 U/L (ref 0–37)
Albumin: 4.1 g/dL (ref 3.5–5.2)
Bilirubin, Direct: 0.1 mg/dL (ref 0.0–0.3)
TOTAL PROTEIN: 7.1 g/dL (ref 6.0–8.3)
Total Bilirubin: 1.1 mg/dL (ref 0.2–1.2)

## 2014-11-02 LAB — VITAMIN B12: Vitamin B-12: 1093 pg/mL — ABNORMAL HIGH (ref 211–911)

## 2014-11-02 LAB — BASIC METABOLIC PANEL
BUN: 22 mg/dL (ref 6–23)
CO2: 24 mEq/L (ref 19–32)
CREATININE: 1 mg/dL (ref 0.4–1.2)
Calcium: 9.1 mg/dL (ref 8.4–10.5)
Chloride: 108 mEq/L (ref 96–112)
GFR: 55.55 mL/min — AB (ref >60.00)
Glucose, Bld: 112 mg/dL — ABNORMAL HIGH (ref 70–99)
Potassium: 4.1 mEq/L (ref 3.5–5.1)
Sodium: 139 mEq/L (ref 135–145)

## 2014-11-02 LAB — LIPID PANEL
CHOL/HDL RATIO: 6
Cholesterol: 232 mg/dL — ABNORMAL HIGH (ref 0–200)
HDL: 40.2 mg/dL (ref >39.00)
LDL Cholesterol: 169 mg/dL — ABNORMAL HIGH (ref 0–99)
NonHDL: 191.8
Triglycerides: 114 mg/dL (ref 0.0–149.0)
VLDL: 22.8 mg/dL (ref 0.0–40.0)

## 2014-11-02 LAB — TSH: TSH: 3.3 u[IU]/mL (ref 0.35–4.50)

## 2014-11-02 NOTE — Assessment & Plan Note (Signed)
Continue with current prescription therapy as reflected on the Med list. Labs  

## 2014-11-02 NOTE — Assessment & Plan Note (Signed)
The patient is here for annual Medicare wellness examination and management of other chronic and acute problems.   The risk factors are reflected in the social history.  The roster of all physicians providing medical care to patient - is listed in the Snapshot section of the chart.  Activities of daily living:  The patient is 100% inedpendent in all ADLs: dressing, toileting, feeding as well as independent mobility  Home safety : The patient has smoke detectors in the home. They wear seatbelts.No firearms at home ( firearms are present in the home, kept in a safe fashion). There is no violence in the home.   There is no risks for hepatitis, STDs or HIV. There is no   history of blood transfusion. They have no travel history to infectious disease endemic areas of the world.  The patient has (has not) seen their dentist in the last six month. They have (not) seen their eye doctor in the last year. They deny (admit to) any hearing difficulty and have not had audiologic testing in the last year.  They do not  have excessive sun exposure. Discussed the need for sun protection: hats, long sleeves and use of sunscreen if there is significant sun exposure.   Diet: the importance of a healthy diet is discussed. They do have a healthy (unhealthy-high fat/fast food) diet.  The patient has a regular exercise program: walking 2-3 per week.  The benefits of regular aerobic exercise were discussed.  Depression screen: there are no signs or vegative symptoms of depression- irritability, change in appetite, anhedonia, sadness/tearfullness.  Cognitive assessment: the patient manages all their financial and personal affairs and is actively engaged. They could relate day,date,year and events; recalled 3/3 objects at 3 minutes; performed clock-face test normally.  The following portions of the patient's history were reviewed and updated as appropriate: allergies, current medications, past family history, past  medical history,  past surgical history, past social history  and problem list.  Vision, hearing, body mass index were assessed and reviewed.   During the course of the visit the patient was educated and counseled about appropriate screening and preventive services including : fall prevention , diabetes screening, nutrition counseling, colorectal cancer screening, and recommended immunizations.

## 2014-11-02 NOTE — Addendum Note (Signed)
Addended by: Cresenciano Lick on: 11/02/2014 04:18 PM   Modules accepted: Orders

## 2014-11-02 NOTE — Assessment & Plan Note (Signed)
Better on Cymbalta+Gabapentin

## 2014-11-02 NOTE — Progress Notes (Signed)
Subjective:    HPI  The patient is here for a wellness exam. The patient has been doing well overall without major physical or psychological issues going on lately, except for words forgetfulness...  F/u wt gain due to stress eating; unable to loose wt  C/o R breast being smaller post-op  F/u R ankle pain - did have a small avulsion fracture of the lateral malleolus 1+ mo ago (Dr Tamala Julian)  F/u on chronic hypertension that has been well controlled with medicines; to address chronic  hyperlipidemia controlled with medicines as well; and to address anxiety, controlled with medical treatment and diet. F/u TMJ - better  F/u on B12 inj - using    BP Readings from Last 3 Encounters:  11/02/14 138/78  08/08/14 140/84  07/19/14 140/76   Wt Readings from Last 3 Encounters:  11/02/14 189 lb (85.73 kg)  08/08/14 188 lb (85.276 kg)  07/19/14 191 lb (86.637 kg)      Review of Systems  Constitutional: Negative for diaphoresis, activity change, appetite change, fatigue and unexpected weight change.  HENT: Negative for dental problem, hearing loss, mouth sores, sinus pressure and voice change.   Eyes: Negative for pain and visual disturbance.  Respiratory: Negative for chest tightness and stridor.   Cardiovascular: Positive for leg swelling. Negative for palpitations.  Gastrointestinal: Negative for blood in stool, abdominal distention and rectal pain.  Genitourinary: Negative for hematuria, decreased urine volume, vaginal bleeding, vaginal discharge, difficulty urinating, vaginal pain and menstrual problem.  Musculoskeletal: Negative for back pain, joint swelling and gait problem.  Skin: Negative for color change and wound.  Neurological: Negative for dizziness, tremors, syncope, speech difficulty and light-headedness.  Hematological: Negative for adenopathy.  Psychiatric/Behavioral: Negative for suicidal ideas, hallucinations, behavioral problems, confusion, sleep disturbance,  dysphoric mood and decreased concentration. The patient is nervous/anxious. The patient is not hyperactive.        Objective:   Physical Exam  Constitutional: She appears well-developed. No distress.  HENT:  Head: Normocephalic.  Right Ear: External ear normal.  Left Ear: External ear normal.  Nose: Nose normal.  Mouth/Throat: Oropharynx is clear and moist.  Eyes: Conjunctivae are normal. Pupils are equal, round, and reactive to light. Right eye exhibits no discharge. Left eye exhibits no discharge.  Neck: Normal range of motion. Neck supple. No JVD present. No tracheal deviation present. No thyromegaly present.  Cardiovascular: Normal rate, regular rhythm and normal heart sounds.   Pulmonary/Chest: No stridor. No respiratory distress. She has no wheezes.  Abdominal: Soft. Bowel sounds are normal. She exhibits no distension and no mass. There is no tenderness. There is no rebound and no guarding.  Musculoskeletal: She exhibits no edema or tenderness.  Lymphadenopathy:    She has no cervical adenopathy.  Neurological: She displays normal reflexes. No cranial nerve deficit. She exhibits normal muscle tone. Coordination normal.  Skin: No rash noted. No erythema.  Psychiatric: She has a normal mood and affect. Her behavior is normal. Judgment and thought content normal.  Lungs CTA  Lab Results  Component Value Date   WBC 5.4 12/30/2011   HGB 13.9 12/30/2011   HCT 40.9 12/30/2011   PLT 186 12/30/2011   GLUCOSE 128* 07/13/2014   CHOL 188 10/23/2011   TRIG 94.0 10/23/2011   HDL 51.80 10/23/2011   LDLCALC 117* 10/23/2011   ALT 17 04/13/2014   AST 19 04/13/2014   NA 141 07/13/2014   K 4.0 07/13/2014   CL 106 07/13/2014   CREATININE 1.0 07/13/2014  BUN 21 07/13/2014   CO2 25 07/13/2014   TSH 2.97 04/13/2014   INR 1.0 06/03/2007   HGBA1C 5.9 07/13/2014           Assessment & Plan:

## 2014-11-02 NOTE — Progress Notes (Signed)
Pre visit review using our clinic review tool, if applicable. No additional management support is needed unless otherwise documented below in the visit note. 

## 2014-11-02 NOTE — Assessment & Plan Note (Signed)
Continue with current prescription therapy as reflected on the Med list.  

## 2014-11-02 NOTE — Assessment & Plan Note (Signed)
Chronic. 

## 2014-11-02 NOTE — Assessment & Plan Note (Signed)
2015 ?meds related (?Gabapentin, Cymbalta) Labs Hold above meds - one at the time

## 2014-11-07 ENCOUNTER — Telehealth: Payer: Self-pay | Admitting: *Deleted

## 2014-11-07 NOTE — Telephone Encounter (Signed)
A user error has taken place.

## 2014-11-24 ENCOUNTER — Other Ambulatory Visit: Payer: Self-pay | Admitting: Internal Medicine

## 2014-12-15 ENCOUNTER — Other Ambulatory Visit: Payer: Self-pay | Admitting: Internal Medicine

## 2015-01-10 ENCOUNTER — Ambulatory Visit (INDEPENDENT_AMBULATORY_CARE_PROVIDER_SITE_OTHER): Payer: Commercial Managed Care - HMO | Admitting: Internal Medicine

## 2015-01-10 VITALS — BP 140/90 | HR 81 | Temp 98.3°F | Wt 191.0 lb

## 2015-01-10 DIAGNOSIS — M25562 Pain in left knee: Secondary | ICD-10-CM

## 2015-01-10 MED ORDER — TRAMADOL HCL 50 MG PO TABS: 50.0000 mg | ORAL_TABLET | Freq: Two times a day (BID) | ORAL | Status: DC | PRN

## 2015-01-10 MED ORDER — METHYLPREDNISOLONE ACETATE 40 MG/ML IJ SUSP
40.0000 mg | Freq: Once | INTRAMUSCULAR | Status: AC
  Administered 2015-01-10: 40 mg via INTRA_ARTICULAR

## 2015-01-10 NOTE — Progress Notes (Signed)
Subjective:    HPI  C/o L knee pain  - bad, can't walk well   BP Readings from Last 3 Encounters:  01/10/15 140/90  11/02/14 138/78  08/08/14 140/84   Wt Readings from Last 3 Encounters:  01/10/15 191 lb (86.637 kg)  11/02/14 189 lb (85.73 kg)  08/08/14 188 lb (85.276 kg)      Review of Systems  Constitutional: Negative for diaphoresis, activity change, appetite change, fatigue and unexpected weight change.  HENT: Negative for dental problem, hearing loss, mouth sores, sinus pressure and voice change.   Eyes: Negative for pain and visual disturbance.  Respiratory: Negative for chest tightness and stridor.   Cardiovascular: Negative for palpitations and leg swelling.  Gastrointestinal: Negative for blood in stool, abdominal distention and rectal pain.  Genitourinary: Negative for hematuria, decreased urine volume, vaginal bleeding, vaginal discharge, difficulty urinating, vaginal pain and menstrual problem.  Musculoskeletal: Positive for arthralgias and gait problem. Negative for back pain and joint swelling.  Skin: Negative for color change and wound.  Neurological: Negative for dizziness, tremors, syncope, speech difficulty and light-headedness.  Hematological: Negative for adenopathy.  Psychiatric/Behavioral: Negative for suicidal ideas, hallucinations, behavioral problems, confusion, sleep disturbance, dysphoric mood and decreased concentration. The patient is nervous/anxious. The patient is not hyperactive.        Objective:   Physical Exam  Constitutional: She appears well-developed. No distress.  Obese NAD  HENT:  Head: Normocephalic.  Right Ear: External ear normal.  Left Ear: External ear normal.  Nose: Nose normal.  Mouth/Throat: Oropharynx is clear and moist.  Eyes: Conjunctivae are normal. Pupils are equal, round, and reactive to light. Right eye exhibits no discharge. Left eye exhibits no discharge.  Neck: Normal range of motion. Neck supple. No JVD  present. No tracheal deviation present. No thyromegaly present.  Cardiovascular: Normal rate, regular rhythm and normal heart sounds.   Pulmonary/Chest: No stridor. No respiratory distress. She has no wheezes.  Abdominal: Soft. Bowel sounds are normal. She exhibits no distension and no mass. There is no tenderness. There is no rebound and no guarding.  Musculoskeletal: She exhibits tenderness (L b. anserina is tender; calf is nl). She exhibits no edema.  TMJ is tender on R R ankle is tender  Lymphadenopathy:    She has no cervical adenopathy.  Neurological: She displays normal reflexes. No cranial nerve deficit. She exhibits normal muscle tone. Coordination normal.  Skin: No rash noted. No erythema.  Psychiatric: She has a normal mood and affect. Her behavior is normal. Judgment and thought content normal.  anxious  Lungs CTA  Lab Results  Component Value Date   WBC 7.3 11/02/2014   HGB 14.4 11/02/2014   HCT 44.1 11/02/2014   PLT 298.0 11/02/2014   GLUCOSE 112* 11/02/2014   CHOL 232* 11/02/2014   TRIG 114.0 11/02/2014   HDL 40.20 11/02/2014   LDLCALC 169* 11/02/2014   ALT 18 11/02/2014   AST 27 11/02/2014   NA 139 11/02/2014   K 4.1 11/02/2014   CL 108 11/02/2014   CREATININE 1.0 11/02/2014   BUN 22 11/02/2014   CO2 24 11/02/2014   TSH 3.30 11/02/2014   INR 1.0 06/03/2007   HGBA1C 5.9 07/13/2014      Procedure Note :    Procedure :Joint Injection,  Knee bursa anserina left   Indication: Bursitis with refractory  chronic pain.   Risks including unsuccessful procedure , bleeding, infection, bruising, skin atrophy and others were explained to the patient in detail as well  as the benefits. Informed consent was obtained and signed.   Tthe patient was placed in a comfortable position. Skin was prepped with Betadine and alcohol  and anesthetized with a cooling spray. Then, a 3 cc syringe with a 1.5 inch long 25-gauge needle was used for a bursa injection in a fan-like fasion  with 3 mL of 2% lidocaine and 40 mg of Depo-Medrol .  Band-Aid was applied.   Tolerated well. Complications: None. Good pain relief following the procedure.   Postprocedure instructions :    A Band-Aid should be left on for 12 hours. Injection therapy is not a cure itself. It is used in conjunction with other modalities. You can use nonsteroidal anti-inflammatories like ibuprofen , hot and cold compresses. Rest is recommended in the next 24 hours. You need to report immediately  if fever, chills or any signs of infection develop.       Assessment & Plan:

## 2015-01-10 NOTE — Progress Notes (Signed)
Pre visit review using our clinic review tool, if applicable. No additional management support is needed unless otherwise documented below in the visit note. 

## 2015-01-10 NOTE — Assessment & Plan Note (Signed)
Discussed Will inject Tramadol prn  Potential benefits of a short term opioids use as well as potential risks (i.e. addiction risk, apnea etc) and complications (i.e. Somnolence, constipation and others) were explained to the patient and were aknowledged.

## 2015-01-10 NOTE — Patient Instructions (Signed)
Postprocedure instructions :    A Band-Aid should be left on for 12 hours. Injection therapy is not a cure itself. It is used in conjunction with other modalities. You can use nonsteroidal anti-inflammatories like ibuprofen , hot and cold compresses. Rest is recommended in the next 24 hours. You need to report immediately  if fever, chills or any signs of infection develop. 

## 2015-01-27 ENCOUNTER — Ambulatory Visit (HOSPITAL_COMMUNITY): Payer: Commercial Managed Care - HMO | Attending: Family Medicine | Admitting: *Deleted

## 2015-01-27 ENCOUNTER — Telehealth: Payer: Self-pay | Admitting: Internal Medicine

## 2015-01-27 ENCOUNTER — Other Ambulatory Visit: Payer: Self-pay | Admitting: *Deleted

## 2015-01-27 ENCOUNTER — Encounter: Payer: Self-pay | Admitting: Family Medicine

## 2015-01-27 ENCOUNTER — Ambulatory Visit (INDEPENDENT_AMBULATORY_CARE_PROVIDER_SITE_OTHER)
Admission: RE | Admit: 2015-01-27 | Discharge: 2015-01-27 | Disposition: A | Discharge status: Home / Self Care | Payer: Commercial Managed Care - HMO | Source: Ambulatory Visit | Attending: Family Medicine | Admitting: Family Medicine

## 2015-01-27 ENCOUNTER — Ambulatory Visit (INDEPENDENT_AMBULATORY_CARE_PROVIDER_SITE_OTHER): Payer: Commercial Managed Care - HMO | Admitting: Family Medicine

## 2015-01-27 ENCOUNTER — Ambulatory Visit: Payer: Commercial Managed Care - HMO

## 2015-01-27 VITALS — BP 132/86 | HR 88 | Ht 67.5 in | Wt 188.0 lb

## 2015-01-27 DIAGNOSIS — I10 Essential (primary) hypertension: Secondary | ICD-10-CM | POA: Insufficient documentation

## 2015-01-27 DIAGNOSIS — M76891 Other specified enthesopathies of right lower limb, excluding foot: Secondary | ICD-10-CM

## 2015-01-27 DIAGNOSIS — M79605 Pain in left leg: Secondary | ICD-10-CM | POA: Insufficient documentation

## 2015-01-27 DIAGNOSIS — Z87891 Personal history of nicotine dependence: Secondary | ICD-10-CM | POA: Diagnosis not present

## 2015-01-27 DIAGNOSIS — M25562 Pain in left knee: Secondary | ICD-10-CM | POA: Diagnosis not present

## 2015-01-27 DIAGNOSIS — M65851 Other synovitis and tenosynovitis, right thigh: Secondary | ICD-10-CM | POA: Diagnosis not present

## 2015-01-27 DIAGNOSIS — M25462 Effusion, left knee: Secondary | ICD-10-CM | POA: Diagnosis not present

## 2015-01-27 MED ORDER — OMEPRAZOLE 40 MG PO CPDR: 40.0000 mg | DELAYED_RELEASE_CAPSULE | Freq: Two times a day (BID) | ORAL | Status: DC

## 2015-01-27 MED ORDER — LOSARTAN POTASSIUM 100 MG PO TABS: 100.0000 mg | ORAL_TABLET | Freq: Every day | ORAL | Status: DC

## 2015-01-27 MED ORDER — MELOXICAM 15 MG PO TABS: 15.0000 mg | ORAL_TABLET | Freq: Every day | ORAL | Status: DC

## 2015-01-27 NOTE — Progress Notes (Signed)
Pre visit review using our clinic review tool, if applicable. No additional management support is needed unless otherwise documented below in the visit note. 

## 2015-01-27 NOTE — Progress Notes (Signed)
Unilateral Lower Extremity Venous Duplex Exam - Left - Negative for DCT

## 2015-01-27 NOTE — Patient Instructions (Addendum)
Hey stranger good to see you We will get ultrasound to rule out clot, i think kthis is highly unlikely Xray downstairs today.  Ice 20 minutes 2 times daily. Usually after activity and before bed. Pennsaid twice daily Wear good shoes  Wear compression sleeve on the thigh See me again in 2-3 weeks to make sure you are doing better

## 2015-01-27 NOTE — Progress Notes (Signed)
[facility] [address], [place] [address] Phone: [phone] Subjective:     CC:  Left knee pain  NTI:[id number] Claudia [name] is a 69 y.o. female coming in with complaint of  Left knee pain. Patient was seen by primary care provider 2 weeks ago and was diagnosed with more of a pes anserine bursitis of the left knee. Patient was given an injection into the area. Patient did have significant bruising and or chewing continues to have the pain. Patient states that it is more of a dull throbbing aching pain and she is on no longer able to straining her knee. Patient denies any giving out on her but states that it feels uncomfortable to extend her knee all the way. All the pain seems to be on the posterior and medial aspects of the knee. Patient has also noticed some mild increase in varicose veins recently.     Past medical history, social, surgical and family history all reviewed in electronic medical record.   Review of Systems: No headache, visual changes, nausea, vomiting, diarrhea, constipation, dizziness, abdominal pain, skin rash, fevers, chills, night sweats, weight loss, swollen lymph nodes, body aches, joint swelling, muscle aches, chest pain, shortness of breath, mood changes.   Objective Blood pressure 132/86, pulse 88, height 5' 7.5" (1.715 m), weight 188 lb (85.276 kg), SpO2 99 %.  General: No apparent distress alert and oriented x3 mood and affect normal, dressed appropriately.  HEENT: Pupils equal, extraocular movements intact  Respiratory: Patient's speak in full sentences and does not appear short of breath  Cardiovascular: No lower extremity edema, non tender, no erythema  Skin: Warm dry intact with no signs of infection or rash on extremities or on axial skeleton. Patient though does have injury to the anterior aspect of her tibia with scratches from her fall. No signs of infection. Abdomen: Soft nontender  Neuro: Cranial nerves II  through XII are intact, neurovascularly intact in all extremities with 2+ DTRs and 2+ pulses.  Lymph: No lymphadenopathy of posterior or anterior cervical chain or axillae bilaterally.  Gait significant antalgic gait MSK:  Non tender with full range of motion and good stability and symmetric strength and tone of shoulders, elbows, wrist, hip, and bilaterally.  Knee:left Increasing varicose veins compared to the contralateral side. Palpation reveals more tenderness over the distal hamstring on the medial aspect as well as ROM full in flexion and extension and lower leg rotation.patient does have pain with full extension on the posterior medial aspect of the knee Ligaments with solid consistent endpoints including ACL, PCL, LCL, MCL. Negative Mcmurray's, Apley's, and Thessalonian tests. Non painful patellar compression. Patellar glide with mildcrepitus. Patellar and quadriceps tendons unremarkable. Imaging strength is 4 out of 5 compared to 5 out of 5 on the contralateral side.   Foot exam shows the patient does have severe pes planus bilaterally. With mild fibular deviation of the large toes bilaterally right greater than left. Patient is has hammertoes of the second and third toes on the right side.   MSK US performed of: left knee This study was ordered, performed, and interpreted by Charlann Boxer D.O.  Knee: All structures visualized. Moderate osteophytic changes of the medial joint line with some mild degenerative changes of the meniscus. Patient though does have hypoechoic changes and increasing Doppler flow of the distal  Hamstring tendonmild Baker's cyst noted in the popliteal fossa Patellar Tendon unremarkable on long and transverse views without effusion. No abnormality of  prepatellar bursa. LCL and MCL unremarkable on long and transverse views. No abnormality of origin of medial or lateral head of the gastrocnemius.  IMPRESSION:  Distal hamstring tendinopathy   Procedure  note 60630; 15 minutes spent for Therapeutic exercises as stated in above notes.  This included exercises focusing on stretching, strengthening, with significant focus on eccentric aspects.   Proper technique shown and discussed handout in great detail with ATC.  All questions were discussed and answered.       Impression and Recommendations:

## 2015-01-27 NOTE — Assessment & Plan Note (Signed)
Patient's pain seems to be more of a distal hamstring tendinopathy. Discussed home exercises, compression sleeve, and patient did learn exercises in greater detail. We discussed topical anti-inflammatories as well as an icing protocol. We will get x-rays to rule out any type of bony abnormality. Patient is concern for the possibility of a deep venous thrombosis. I think this is low likelihood the patient does have varicose veins on the side compared to the other side. No erythema of the skin and no redness. We will get distal to rule out and help with the patient's anxiety. Patient and will come back and see me again in 2-3 weeks for further evaluation. Continuing to have difficulty we may need to consider either advance imaging apparent intra-articular injection.

## 2015-02-06 ENCOUNTER — Telehealth: Payer: Self-pay | Admitting: Internal Medicine

## 2015-02-06 DIAGNOSIS — M25562 Pain in left knee: Secondary | ICD-10-CM

## 2015-02-06 NOTE — Telephone Encounter (Signed)
Discussed x ray results with pt & entered mri order.

## 2015-02-06 NOTE — Telephone Encounter (Signed)
Pt called in said that she is not getting any better and wants to have a MRI done, and she would like to know the results of her xrays    [phone]

## 2015-02-09 ENCOUNTER — Telehealth: Payer: Self-pay | Admitting: Family Medicine

## 2015-02-09 ENCOUNTER — Ambulatory Visit
Admission: RE | Admit: 2015-02-09 | Discharge: 2015-02-09 | Disposition: A | Discharge status: Home / Self Care | Payer: Commercial Managed Care - HMO | Source: Ambulatory Visit | Attending: Family Medicine | Admitting: Family Medicine

## 2015-02-09 DIAGNOSIS — M23252 Derangement of posterior horn of lateral meniscus due to old tear or injury, left knee: Secondary | ICD-10-CM | POA: Diagnosis not present

## 2015-02-09 DIAGNOSIS — M25562 Pain in left knee: Secondary | ICD-10-CM

## 2015-02-09 DIAGNOSIS — M23222 Derangement of posterior horn of medial meniscus due to old tear or injury, left knee: Secondary | ICD-10-CM | POA: Diagnosis not present

## 2015-02-09 DIAGNOSIS — M84362A Stress fracture, left tibia, initial encounter for fracture: Secondary | ICD-10-CM | POA: Diagnosis not present

## 2015-02-09 NOTE — Telephone Encounter (Signed)
Patient is requesting call back as soon as results are back in.  She states that she is in a lot of pain and does not want to worry all weekend.

## 2015-02-09 NOTE — Telephone Encounter (Signed)
This case with patient at length. I do think that the insufficiency fracture is likely giving her the most pain as well as the joint effusion. Due to the amount of arthritis as well as the posterior meniscal tear I do feel that further evaluation by a orthopedic surgeon to know her possible surgical interventions and could be beneficial. Patient states that she may want to have this done in [facility]  other surgeon I would recommend.patient will ask her daughter and get back to me and we will put the referral in. In the meantime we discussed patient being nonweightbearing and being on the crutches.

## 2015-02-13 ENCOUNTER — Other Ambulatory Visit: Payer: Self-pay | Admitting: *Deleted

## 2015-02-13 DIAGNOSIS — M25562 Pain in left knee: Secondary | ICD-10-CM

## 2015-02-14 NOTE — Telephone Encounter (Signed)
Patient needs an [facility] referral. She is trying to get into 2 different doctors to see which one she can get into first. One is [facility] in [place]. Their phone # is [phone]. I don't know the fax. The other one is [facility] with a Dr. Unknown Foley. Their fax # is [phone]. They won't schedule until they have the referral.

## 2015-02-15 NOTE — Telephone Encounter (Signed)
Stanton Kidney is working on this.

## 2015-02-17 ENCOUNTER — Ambulatory Visit: Payer: Medicare HMO | Admitting: Family Medicine

## 2015-02-20 DIAGNOSIS — M13852 Other specified arthritis, left hip: Secondary | ICD-10-CM | POA: Diagnosis not present

## 2015-02-20 DIAGNOSIS — M170 Bilateral primary osteoarthritis of knee: Secondary | ICD-10-CM | POA: Diagnosis not present

## 2015-03-08 ENCOUNTER — Telehealth: Payer: Self-pay | Admitting: Internal Medicine

## 2015-03-08 NOTE — Telephone Encounter (Signed)
Patient saw dr Tamala Julian on 01/27/2015, and there was a referral for the [place] done that day. There was not an updated referral to see him that day, however. There is an [place] on file for 06/28/2014, but it appears that perhaps it did not carry over. The patient's claims are being denied for no auth. Please review to see if retro auth can be obtained in this case. If not, we will need to adjust office visit for no auth obtained.

## 2015-03-10 ENCOUNTER — Ambulatory Visit (INDEPENDENT_AMBULATORY_CARE_PROVIDER_SITE_OTHER): Payer: Commercial Managed Care - HMO | Admitting: Internal Medicine

## 2015-03-10 ENCOUNTER — Encounter: Payer: Self-pay | Admitting: Internal Medicine

## 2015-03-10 VITALS — BP 130/80 | HR 87 | Wt 188.0 lb

## 2015-03-10 DIAGNOSIS — M25562 Pain in left knee: Secondary | ICD-10-CM

## 2015-03-10 DIAGNOSIS — G2581 Restless legs syndrome: Secondary | ICD-10-CM | POA: Diagnosis not present

## 2015-03-10 DIAGNOSIS — I10 Essential (primary) hypertension: Secondary | ICD-10-CM | POA: Diagnosis not present

## 2015-03-10 MED ORDER — OXYCODONE-ACETAMINOPHEN 5-325 MG PO TABS: 0.5000 | ORAL_TABLET | Freq: Three times a day (TID) | ORAL | Status: DC | PRN

## 2015-03-10 NOTE — Assessment & Plan Note (Addendum)
2/16 L>>R Dr Tamala Julian Dr Sir[facility] - Ortho Pt will see Dr Lyla Glassing for a 2nd opinion 4/16   L knee MRI IMPRESSION: 1. Small subchondral insufficiency fracture of the medial tibial plateau with severe surrounding marrow edema. 2. Tricompartmental cartilage abnormalities as described above. 3. Radial tear of the posterior horn- body junction of the medial meniscus. 4. Small vertical tear of the posterior horn of the lateral meniscus.   Electronically Signed  By: Kathreen Devoid  On: 02/09/2015 12:54  Percocet w/Claritin prn  Potential benefits of a long term opioids use as well as potential risks (i.e. addiction risk, apnea etc) and complications (i.e. Somnolence, constipation and others) were explained to the patient and were aknowledged.

## 2015-03-10 NOTE — Assessment & Plan Note (Signed)
On Gabapentin 

## 2015-03-10 NOTE — Progress Notes (Signed)
Pre visit review using our clinic review tool, if applicable. No additional management support is needed unless otherwise documented below in the visit note. 

## 2015-03-10 NOTE — Progress Notes (Signed)
Subjective:    HPI  C/o L knee pain  - bad, can't walk well F/u HTN   BP Readings from Last 3 Encounters:  03/10/15 130/80  01/27/15 132/86  01/10/15 140/90   Wt Readings from Last 3 Encounters:  03/10/15 188 lb (85.276 kg)  02/09/15 186 lb (84.369 kg)  01/27/15 188 lb (85.276 kg)      Review of Systems  Constitutional: Negative for diaphoresis, activity change, appetite change, fatigue and unexpected weight change.  HENT: Negative for dental problem, hearing loss, mouth sores, sinus pressure and voice change.   Eyes: Negative for pain and visual disturbance.  Respiratory: Negative for chest tightness and stridor.   Cardiovascular: Negative for palpitations and leg swelling.  Gastrointestinal: Negative for blood in stool, abdominal distention and rectal pain.  Genitourinary: Negative for hematuria, decreased urine volume, vaginal bleeding, vaginal discharge, difficulty urinating, vaginal pain and menstrual problem.  Musculoskeletal: Positive for arthralgias and gait problem. Negative for back pain and joint swelling.  Skin: Negative for color change and wound.  Neurological: Negative for dizziness, tremors, syncope, speech difficulty and light-headedness.  Hematological: Negative for adenopathy.  Psychiatric/Behavioral: Negative for suicidal ideas, hallucinations, behavioral problems, confusion, sleep disturbance, dysphoric mood and decreased concentration. The patient is nervous/anxious. The patient is not hyperactive.        Objective:   Physical Exam  Constitutional: She appears well-developed. No distress.  Obese NAD  HENT:  Head: Normocephalic.  Right Ear: External ear normal.  Left Ear: External ear normal.  Nose: Nose normal.  Mouth/Throat: Oropharynx is clear and moist.  Eyes: Conjunctivae are normal. Pupils are equal, round, and reactive to light. Right eye exhibits no discharge. Left eye exhibits no discharge.  Neck: Normal range of motion. Neck supple.  No JVD present. No tracheal deviation present. No thyromegaly present.  Cardiovascular: Normal rate, regular rhythm and normal heart sounds.   Pulmonary/Chest: No stridor. No respiratory distress. She has no wheezes.  Abdominal: Soft. Bowel sounds are normal. She exhibits no distension and no mass. There is no tenderness. There is no rebound and no guarding.  Musculoskeletal: She exhibits tenderness (L b. anserina is tender; calf is nl). She exhibits no edema.  TMJ is tender on R R ankle is tender  Lymphadenopathy:    She has no cervical adenopathy.  Neurological: She displays normal reflexes. No cranial nerve deficit. She exhibits normal muscle tone. Coordination normal.  Skin: No rash noted. No erythema.  Psychiatric: She has a normal mood and affect. Her behavior is normal. Judgment and thought content normal.  anxious  Lungs CTA B knees are tender L>R Using a cane  Lab Results  Component Value Date   WBC 7.3 11/02/2014   HGB 14.4 11/02/2014   HCT 44.1 11/02/2014   PLT 298.0 11/02/2014   GLUCOSE 112* 11/02/2014   CHOL 232* 11/02/2014   TRIG 114.0 11/02/2014   HDL 40.20 11/02/2014   LDLCALC 169* 11/02/2014   ALT 18 11/02/2014   AST 27 11/02/2014   NA 139 11/02/2014   K 4.1 11/02/2014   CL 108 11/02/2014   CREATININE 1.0 11/02/2014   BUN 22 11/02/2014   CO2 24 11/02/2014   TSH 3.30 11/02/2014   INR 1.0 06/03/2007   HGBA1C 5.9 07/13/2014    L knee MRI IMPRESSION: 1. Small subchondral insufficiency fracture of the medial tibial plateau with severe surrounding marrow edema. 2. Tricompartmental cartilage abnormalities as described above. 3. Radial tear of the posterior horn- body junction of the medial  meniscus. 4. Small vertical tear of the posterior horn of the lateral meniscus.   Electronically Signed  By: Kathreen Devoid  On: 02/09/2015 12:54      Assessment & Plan:

## 2015-03-10 NOTE — Assessment & Plan Note (Signed)
Chronic  On Losartan 

## 2015-03-16 NOTE — Telephone Encounter (Signed)
[employer] no longer back date referrals

## 2015-03-17 DIAGNOSIS — M7052 Other bursitis of knee, left knee: Secondary | ICD-10-CM | POA: Diagnosis not present

## 2015-03-17 DIAGNOSIS — M1712 Unilateral primary osteoarthritis, left knee: Secondary | ICD-10-CM | POA: Diagnosis not present

## 2015-04-21 ENCOUNTER — Ambulatory Visit: Payer: Commercial Managed Care - HMO | Admitting: Internal Medicine

## 2015-04-28 DIAGNOSIS — M1712 Unilateral primary osteoarthritis, left knee: Secondary | ICD-10-CM | POA: Diagnosis not present

## 2015-04-28 DIAGNOSIS — S82132D Displaced fracture of medial condyle of left tibia, subsequent encounter for closed fracture with routine healing: Secondary | ICD-10-CM | POA: Diagnosis not present

## 2015-04-28 DIAGNOSIS — M7052 Other bursitis of knee, left knee: Secondary | ICD-10-CM | POA: Diagnosis not present

## 2015-05-10 ENCOUNTER — Encounter: Payer: Self-pay | Admitting: Internal Medicine

## 2015-05-10 ENCOUNTER — Ambulatory Visit (INDEPENDENT_AMBULATORY_CARE_PROVIDER_SITE_OTHER): Payer: Commercial Managed Care - HMO | Admitting: Internal Medicine

## 2015-05-10 VITALS — BP 144/84 | HR 67 | Wt 189.0 lb

## 2015-05-10 DIAGNOSIS — M25562 Pain in left knee: Secondary | ICD-10-CM

## 2015-05-10 DIAGNOSIS — I519 Heart disease, unspecified: Secondary | ICD-10-CM

## 2015-05-10 DIAGNOSIS — I5189 Other ill-defined heart diseases: Secondary | ICD-10-CM

## 2015-05-10 DIAGNOSIS — E538 Deficiency of other specified B group vitamins: Secondary | ICD-10-CM | POA: Diagnosis not present

## 2015-05-10 DIAGNOSIS — Z01818 Encounter for other preprocedural examination: Secondary | ICD-10-CM | POA: Diagnosis not present

## 2015-05-10 DIAGNOSIS — F411 Generalized anxiety disorder: Secondary | ICD-10-CM

## 2015-05-10 MED ORDER — DULOXETINE HCL 60 MG PO CPEP: 60.0000 mg | ORAL_CAPSULE | Freq: Every day | ORAL | Status: DC

## 2015-05-10 MED ORDER — METFORMIN HCL 500 MG PO TABS: 500.0000 mg | ORAL_TABLET | Freq: Every day | ORAL | Status: DC

## 2015-05-10 MED ORDER — VALACYCLOVIR HCL 500 MG PO TABS: 500.0000 mg | ORAL_TABLET | Freq: Two times a day (BID) | ORAL | Status: DC

## 2015-05-10 NOTE — Progress Notes (Signed)
Subjective:    HPI   IM Consult Reason: pre-op clearance. Pt is planning to have L TKR on 06/01/15 Req. By Dr Lyla Glassing  F/u L knee pain  - bad, can't walk well. F/u HTN, anxiety, B12 deficiency - stable   BP Readings from Last 3 Encounters:  05/10/15 144/84  03/10/15 130/80  01/27/15 132/86   Wt Readings from Last 3 Encounters:  05/10/15 189 lb (85.73 kg)  03/10/15 188 lb (85.276 kg)  02/09/15 186 lb (84.369 kg)    BP 144/84 mmHg  Pulse 67  Wt 189 lb (85.73 kg)  SpO2 98%  Past Medical History  Diagnosis Date  . Osteoarthritis   . Neuralgia of chest     post op  . Internal hemorrhoids without mention of complication   . Irritable bowel syndrome   . Stricture and stenosis of esophagus   . Hiatal hernia   . Chest pain, unspecified   . Acute upper respiratory infections of unspecified site   . Other B-complex deficiencies   . Other malaise and fatigue   . Osteoarthrosis, unspecified whether generalized or localized, unspecified site   . Cramp of limb   . Depressive disorder, not elsewhere classified   . Personal history of malignant neoplasm of breast   . Anxiety state, unspecified   . Neuralgia, neuritis, and radiculitis, unspecified   . Headache(784.0)   . Restless legs syndrome (RLS)   . Insomnia, unspecified   . Myalgia and myositis, unspecified   . Unspecified essential hypertension   . History of cold sores    Past Surgical History  Procedure Laterality Date  . Abdominal hysterectomy    . Tonsillectomy    . Mastectomy      bilateral total  . Cholecystectomy      reports that she has never smoked. She does not have any smokeless tobacco history on file. She reports that she does not drink alcohol or use illicit drugs. family history includes Cancer in her mother and paternal grandmother; Coronary artery disease in her brother and maternal grandmother; Diabetes in her brother, mother, and sister; Heart disease in her sister; Kidney disease in her  brother and mother; Stroke in her sister. Allergies  Allergen Reactions  . Bupropion Hcl     REACTION: CP  . Lovastatin     REACTION: myalgial  . Norco [Hydrocodone-Acetaminophen]     intolerant  . Olanzapine-Fluoxetine Hcl     REACTION: Leg swelling  . Sodium Lauryl Sulfate   . Sulfonamide Derivatives   . Tramadol     itching   Current Outpatient Prescriptions on File Prior to Visit  Medication Sig Dispense Refill  . cholecalciferol (VITAMIN D) 1000 UNITS tablet Take 2,000 Units by mouth daily.      . cyanocobalamin (,VITAMIN B-12,) 1000 MCG/ML injection INJECT 1 ML INTO THE MUSCLE EVERY 2 WEEKS TO 1 MONTH AS DIRECTED 10 mL 5  . gabapentin (NEURONTIN) 600 MG tablet TAKE 1 TABLET THREE TIMES DAILY 270 tablet 3  . INSULIN SYRINGE 1CC/29G (B-D INSULIN SYRINGE) 29G X 1/2" 1 ML MISC As dirrected 50 each 3  . loratadine (CLARITIN) 10 MG tablet Take 1 tablet (10 mg total) by mouth daily. As needed for allergies 90 tablet 1  . losartan (COZAAR) 100 MG tablet Take 1 tablet (100 mg total) by mouth daily. 90 tablet 3  . meloxicam (MOBIC) 15 MG tablet Take 1 tablet (15 mg total) by mouth daily. 90 tablet 3  . Multiple Vitamins-Minerals (OCUVITE ADULT  50+) CAPS Take 1 capsule by mouth daily.      Marland Kitchen omeprazole (PRILOSEC) 40 MG capsule Take 1 capsule (40 mg total) by mouth 2 (two) times daily. 180 capsule 3  . oxyCODONE-acetaminophen (ROXICET) 5-325 MG per tablet Take 0.5-1 tablets by mouth every 8 (eight) hours as needed for severe pain. (Patient not taking: Reported on 05/10/2015) 60 tablet 0   Current Facility-Administered Medications on File Prior to Visit  Medication Dose Route Frequency Provider Last Rate Last Dose  . methylPREDNISolone acetate (DEPO-MEDROL) injection 40 mg  40 mg Intra-articular Once Lucresia Simic V, MD          Review of Systems  Constitutional: Negative for diaphoresis, activity change, appetite change, fatigue and unexpected weight change.  HENT: Negative for  dental problem, hearing loss, mouth sores, sinus pressure and voice change.   Eyes: Negative for pain and visual disturbance.  Respiratory: Negative for chest tightness and stridor.   Cardiovascular: Negative for palpitations and leg swelling.  Gastrointestinal: Negative for blood in stool, abdominal distention and rectal pain.  Genitourinary: Negative for hematuria, decreased urine volume, vaginal bleeding, vaginal discharge, difficulty urinating, vaginal pain and menstrual problem.  Musculoskeletal: Positive for arthralgias and gait problem. Negative for back pain and joint swelling.  Skin: Negative for color change and wound.  Neurological: Negative for dizziness, tremors, syncope, speech difficulty and light-headedness.  Hematological: Negative for adenopathy.  Psychiatric/Behavioral: Negative for suicidal ideas, hallucinations, behavioral problems, confusion, sleep disturbance, dysphoric mood and decreased concentration. The patient is nervous/anxious. The patient is not hyperactive.        Objective:   Physical Exam  Constitutional: She appears well-developed. No distress.  Obese NAD  HENT:  Head: Normocephalic.  Right Ear: External ear normal.  Left Ear: External ear normal.  Nose: Nose normal.  Mouth/Throat: Oropharynx is clear and moist.  Eyes: Conjunctivae are normal. Pupils are equal, round, and reactive to light. Right eye exhibits no discharge. Left eye exhibits no discharge.  Neck: Normal range of motion. Neck supple. No JVD present. No tracheal deviation present. No thyromegaly present.  Cardiovascular: Normal rate, regular rhythm and normal heart sounds.   Pulmonary/Chest: No stridor. No respiratory distress. She has no wheezes.  Abdominal: Soft. Bowel sounds are normal. She exhibits no distension and no mass. There is no tenderness. There is no rebound and no guarding.  Musculoskeletal: She exhibits tenderness (L b. anserina is tender; calf is nl). She exhibits no  edema.  TMJ is tender on R R ankle is tender  Lymphadenopathy:    She has no cervical adenopathy.  Neurological: She displays normal reflexes. No cranial nerve deficit. She exhibits normal muscle tone. Coordination normal.  Skin: No rash noted. No erythema.  Psychiatric: She has a normal mood and affect. Her behavior is normal. Judgment and thought content normal.  anxious  Lungs CTA B knees are tender L>R Using a cane  Lab Results  Component Value Date   WBC 7.3 11/02/2014   HGB 14.4 11/02/2014   HCT 44.1 11/02/2014   PLT 298.0 11/02/2014   GLUCOSE 112* 11/02/2014   CHOL 232* 11/02/2014   TRIG 114.0 11/02/2014   HDL 40.20 11/02/2014   LDLCALC 169* 11/02/2014   ALT 18 11/02/2014   AST 27 11/02/2014   NA 139 11/02/2014   K 4.1 11/02/2014   CL 108 11/02/2014   CREATININE 1.0 11/02/2014   BUN 22 11/02/2014   CO2 24 11/02/2014   TSH 3.30 11/02/2014   INR 1.0 06/03/2007  HGBA1C 5.9 07/13/2014    L knee MRI IMPRESSION: 1. Small subchondral insufficiency fracture of the medial tibial plateau with severe surrounding marrow edema. 2. Tricompartmental cartilage abnormalities as described above. 3. Radial tear of the posterior horn- body junction of the medial meniscus. 4. Small vertical tear of the posterior horn of the lateral meniscus.   Electronically Signed  By: Kathreen Devoid  On: 02/09/2015 12:54      Assessment & Plan:

## 2015-05-10 NOTE — Assessment & Plan Note (Addendum)
6/16 Claudia [name] is clear for surgery, assuming her pre-op EKG, CXR, labs are acceptable. ECHO ordered.

## 2015-05-10 NOTE — Progress Notes (Signed)
Pre visit review using our clinic review tool, if applicable. No additional management support is needed unless otherwise documented below in the visit note. 

## 2015-05-11 ENCOUNTER — Ambulatory Visit: Payer: Self-pay | Admitting: Orthopedic Surgery

## 2015-05-11 MED ORDER — DEXAMETHASONE SODIUM PHOSPHATE 4 MG/ML IJ SOLN: 4.0000 mg | INTRAMUSCULAR | Status: AC

## 2015-05-11 MED ORDER — SODIUM CHLORIDE 0.9 % IV SOLN: 4.0000 mg | INTRAVENOUS | Status: AC

## 2015-05-11 MED ORDER — ACETAMINOPHEN 10 MG/ML IV SOLN: 1000.0000 mg | INTRAVENOUS | Status: AC

## 2015-05-12 ENCOUNTER — Other Ambulatory Visit: Payer: Self-pay

## 2015-05-12 ENCOUNTER — Ambulatory Visit (HOSPITAL_COMMUNITY): Payer: Commercial Managed Care - HMO | Attending: Cardiovascular Disease

## 2015-05-12 DIAGNOSIS — I10 Essential (primary) hypertension: Secondary | ICD-10-CM | POA: Diagnosis not present

## 2015-05-12 DIAGNOSIS — I071 Rheumatic tricuspid insufficiency: Secondary | ICD-10-CM | POA: Diagnosis not present

## 2015-05-12 DIAGNOSIS — Z01818 Encounter for other preprocedural examination: Secondary | ICD-10-CM | POA: Diagnosis not present

## 2015-05-14 DIAGNOSIS — I5189 Other ill-defined heart diseases: Secondary | ICD-10-CM | POA: Insufficient documentation

## 2015-05-14 MED ORDER — AMLODIPINE BESYLATE 2.5 MG PO TABS: 2.5000 mg | ORAL_TABLET | Freq: Every day | ORAL | Status: DC

## 2015-05-14 NOTE — Assessment & Plan Note (Signed)
On B12 

## 2015-05-14 NOTE — Assessment & Plan Note (Signed)
On Duloxetine.

## 2015-05-14 NOTE — Assessment & Plan Note (Signed)
6/16 grade 2 Start Norvasc

## 2015-05-16 ENCOUNTER — Other Ambulatory Visit: Payer: Self-pay | Admitting: *Deleted

## 2015-05-16 MED ORDER — VALACYCLOVIR HCL 500 MG PO TABS: 500.0000 mg | ORAL_TABLET | Freq: Two times a day (BID) | ORAL | Status: DC

## 2015-05-17 ENCOUNTER — Ambulatory Visit: Payer: Self-pay | Admitting: Orthopedic Surgery

## 2015-05-17 DIAGNOSIS — S82132D Displaced fracture of medial condyle of left tibia, subsequent encounter for closed fracture with routine healing: Secondary | ICD-10-CM | POA: Diagnosis not present

## 2015-05-17 NOTE — H&P (Signed)
TOTAL KNEE ADMISSION H&P  Patient is being admitted for left total knee arthroplasty.  Subjective:  Chief Complaint:left knee pain.  HPI: Claudia [name], 69 y.o. female, has a history of pain and functional disability in the left knee due to subchondral insufficiency fracture and arthritis and has failed non-surgical conservative treatments for greater than 12 weeks to includeuse of assistive devices, weight reduction as appropriate, activity modification and medial unloader brace.  Onset of symptoms was gradual, starting 1 years ago with gradually worsening course since that time. The patient noted no past surgery on the left knee(s).  Patient currently rates pain in the left knee(s) at 10 out of 10 with activity. Patient has night pain, worsening of pain with activity and weight bearing and pain that interferes with activities of daily living.  Patient has evidence of subchondral cysts, subchondral sclerosis and joint space narrowing by imaging studies. There is no active infection.  Patient Active Problem List   Diagnosis Date Noted  . Diastolic dysfunction without heart failure 05/14/2015  . Preop exam for internal medicine 05/10/2015  . Hamstring tendonitis of right thigh 01/27/2015  . Forgetfulness 11/02/2014  . Pes planus of both feet 08/08/2014  . Avulsion fracture of lateral malleolus 06/07/2014  . Sprain of ankle, unspecified site 02/01/2014  . Tibialis posterior tendinitis 12/03/2013  . Right ankle pain 09/14/2013  . Hyperglycemia 09/14/2013  . Leg pain 04/29/2012  . Knee pain 04/29/2012  . Well adult exam 10/23/2011  . 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 Weight gain 04/10/2011  . LIPOMA 10/17/2010  . HEMATURIA UNSPECIFIED 08/27/2010  . ABDOMINAL PAIN, GENERALIZED 07/16/2010  . WEIGHT LOSS 05/09/2010  . FREQUENCY, URINARY 05/09/2010  . ABDOMINAL PAIN, EPIGASTRIC 05/09/2010  . VERTIGO 02/06/2010  . NECK PAIN 03/28/2009  . GALLSTONES 12/07/2008  . HEMORRHOIDS, INTERNAL  11/23/2008  . ESOPHAGEAL STRICTURE 11/23/2008  . HIATAL HERNIA 11/23/2008  . IBS 11/23/2008  . UPPER RESPIRATORY INFECTION (URI) 11/01/2008  . Chest pain, unspecified 11/01/2008  . B12 deficiency 08/16/2008  . FATIGUE 08/16/2008  . Anxiety state 11/25/2007  . Depression 11/25/2007  . OSTEOARTHRITIS 11/25/2007  . CRAMPS,LEG 11/25/2007  . BREAST CANCER, HX OF 11/25/2007  . RESTLESS LEG SYNDROME 07/23/2007  . Essential hypertension 07/23/2007  . Myalgia and myositis 07/23/2007  . NEURALGIA 07/23/2007  . INSOMNIA 07/23/2007  . SYMPTOM, HEADACHE 07/23/2007   Past Medical History  Diagnosis Date  . Osteoarthritis   . Neuralgia of chest     post op  . Internal hemorrhoids without mention of complication   . Irritable bowel syndrome   . Stricture and stenosis of esophagus   . Hiatal hernia   . Chest pain, unspecified   . Acute upper respiratory infections of unspecified site   . Other B-complex deficiencies   . Other malaise and fatigue   . Osteoarthrosis, unspecified whether generalized or localized, unspecified site   . Cramp of limb   . Depressive disorder, not elsewhere classified   . Personal history of malignant neoplasm of breast   . Anxiety state, unspecified   . Neuralgia, neuritis, and radiculitis, unspecified   . Headache(784.0)   . Restless legs syndrome (RLS)   . Insomnia, unspecified   . Myalgia and myositis, unspecified   . Unspecified essential hypertension   . History of cold sores     Past Surgical History  Procedure Laterality Date  . Abdominal hysterectomy    . Tonsillectomy    . Mastectomy      bilateral total  .  Cholecystectomy       (Not in a hospital admission) Allergies  Allergen Reactions  . Bupropion Hcl     REACTION: CP  . Lovastatin     REACTION: myalgial  . Norco [Hydrocodone-Acetaminophen]     intolerant  . Olanzapine-Fluoxetine Hcl     REACTION: Leg swelling  . Sodium Lauryl Sulfate   . Sulfonamide Derivatives   . Tramadol      itching    History  Substance Use Topics  . Smoking status: Never Smoker   . Smokeless tobacco: Not on file  . Alcohol Use: No    Family History  Problem Relation Age of Onset  . Diabetes Mother   . Kidney disease Mother   . Cancer Mother     lymphoma  . Diabetes Sister   . Heart disease Sister   . Stroke Sister   . Diabetes Brother   . Kidney disease Brother   . Coronary artery disease Brother   . Cancer Paternal Grandmother     Breast  . Coronary artery disease Maternal Grandmother      Review of Systems  Constitutional: Negative.   HENT: Negative.   Eyes: Negative.   Respiratory: Negative.   Cardiovascular: Negative.   Gastrointestinal: Negative.   Genitourinary: Positive for frequency.  Musculoskeletal: Positive for myalgias and joint pain.  Skin: Negative.   Neurological: Negative.   Endo/Heme/Allergies: Negative.   Psychiatric/Behavioral: Negative.     Objective:  Physical Exam  Vitals reviewed. Constitutional: She is oriented to person, place, and time. She appears well-developed and well-nourished.  HENT:  Head: Normocephalic and atraumatic.  Eyes: Conjunctivae are normal. Pupils are equal, round, and reactive to light.  Neck: Normal range of motion. Neck supple.  Cardiovascular: Normal rate, regular rhythm, normal heart sounds and intact distal pulses.   Respiratory: Effort normal and breath sounds normal. No respiratory distress.  GI: Soft. Bowel sounds are normal. She exhibits no distension.  Genitourinary:  deferred  Musculoskeletal:       Left knee: She exhibits decreased range of motion and swelling. Tenderness found. Medial joint line tenderness noted.  Neurological: She is alert and oriented to person, place, and time. She has normal reflexes.  Skin: Skin is warm and dry.  Psychiatric: She has a normal mood and affect. Her behavior is normal. Judgment and thought content normal.    Vital signs in last 24  hours: @VSRANGES @  Labs:   Estimated body mass index is 29.15 kg/(m^2) as calculated from the following:   Height as of 01/27/15: 5' 7.5" (1.715 m).   Weight as of 05/10/15: 85.73 kg (189 lb).   Imaging Review Plain radiographs demonstrate severe degenerative joint disease of the left knee with a collapsed subchondral insufficiency fracture. The overall alignment isneutral. The bone quality appears to be adequate for age and reported activity level.  Assessment/Plan:  End stage arthritis, left knee   The patient history, physical examination, clinical judgment of the provider and imaging studies are consistent with end stage degenerative joint disease of the left knee(s) and total knee arthroplasty is deemed medically necessary. The treatment options including medical management, injection therapy arthroscopy and arthroplasty were discussed at length. The risks and benefits of total knee arthroplasty were presented and reviewed. The risks due to aseptic loosening, infection, stiffness, patella tracking problems, thromboembolic complications and other imponderables were discussed. The patient acknowledged the explanation, agreed to proceed with the plan and consent was signed. Patient is being admitted for inpatient treatment for surgery,  pain control, PT, OT, prophylactic antibiotics, VTE prophylaxis, progressive ambulation and ADL's and discharge planning. The patient is planning to be discharged home with home health services

## 2015-05-25 ENCOUNTER — Other Ambulatory Visit (HOSPITAL_COMMUNITY): Payer: Self-pay | Admitting: *Deleted

## 2015-05-25 NOTE — Progress Notes (Signed)
Echo 6-26 16 epic

## 2015-05-25 NOTE — Patient Instructions (Addendum)
Claudia [name]  05/25/2015   Your procedure is scheduled on: Thursday June 01, 2015  Report to [facility] Main  Entrance take [place]  elevators to 3rd floor to  [place] at 9:10 AM.  Call this number if you have problems the morning of surgery [phone]   Remember: ONLY 1 PERSON MAY GO WITH YOU TO SHORT STAY TO GET  READY MORNING OF [name].  Do not eat food or drink liquids :After Midnight.     Take these medicines the morning of surgery with A SIP OF WATER: Amlodipine (Norvasc), Duloxetine (Cymbalta), Omeprazole (Prilosec), Eyedrop if needed                                You may not have any metal on your body including hair pins and              piercings  Do not wear jewelry, make-up, lotions, powders or perfumes, deodorant             Do not wear nail polish.  Do not shave  48 hours prior to surgery.         .   Do not bring valuables to the hospital. [name].  Contacts, dentures or bridgework may not be worn into surgery.  Leave suitcase in the car. After surgery it may be brought to your room.    Marland Kitchen   Special Instructions: coughing and deep breathing exercises, leg exercises              Please read over the following fact sheets you were given: _____________________________________________________________________             [facility] - Preparing for Surgery Before surgery, you can play an important role.  Because skin is not sterile, your skin needs to be as free of germs as possible.  You can reduce the number of germs on your skin by washing with CHG (chlorahexidine gluconate) soap before surgery.  CHG is an antiseptic cleaner which kills germs and bonds with the skin to continue killing germs even after washing. Please DO NOT use if you have an allergy to CHG or antibacterial soaps.  If your skin becomes reddened/irritated stop using the CHG and inform your nurse when you arrive  at Short Stay. Do not shave (including legs and underarms) for at least 48 hours prior to the first CHG shower.  You may shave your face/neck. Please follow these instructions carefully:  1.  Shower with CHG Soap the night before surgery and the  morning of Surgery.  2.  If you choose to wash your hair, wash your hair first as usual with your  normal  shampoo.  3.  After you shampoo, rinse your hair and body thoroughly to remove the  shampoo.                           4.  Use CHG as you would any other liquid soap.  You can apply chg directly  to the skin and wash                       Gently with a scrungie  or clean washcloth.  5.  Apply the CHG Soap to your body ONLY FROM THE NECK DOWN.   Do not use on face/ open                           Wound or open sores. Avoid contact with eyes, ears mouth and genitals (private parts).                       Wash face,  Genitals (private parts) with your normal soap.             6.  Wash thoroughly, paying special attention to the area where your surgery  will be performed.  7.  Thoroughly rinse your body with warm water from the neck down.  8.  DO NOT shower/wash with your normal soap after using and rinsing off  the CHG Soap.                9.  Pat yourself dry with a clean towel.            10.  Wear clean pajamas.            11.  Place clean sheets on your bed the night of your first shower and do not  sleep with pets. Day of Surgery : Do not apply any lotions/deodorants the morning of surgery.  Please wear clean clothes to the hospital/surgery center.  FAILURE TO FOLLOW THESE INSTRUCTIONS MAY RESULT IN THE CANCELLATION OF YOUR SURGERY PATIENT SIGNATURE_________________________________  NURSE SIGNATURE__________________________________  ________________________________________________________________________   Adam Phenix  An incentive spirometer is a tool that can help keep your lungs clear and active. This tool measures how well you  are filling your lungs with each breath. Taking long deep breaths may help reverse or decrease the chance of developing breathing (pulmonary) problems (especially infection) following:  A long period of time when you are unable to move or be active. BEFORE THE PROCEDURE   If the spirometer includes an indicator to show your best effort, your nurse or respiratory therapist will set it to a desired goal.  If possible, sit up straight or lean slightly forward. Try not to slouch.  Hold the incentive spirometer in an upright position. INSTRUCTIONS FOR USE   Sit on the edge of your bed if possible, or sit up as far as you can in bed or on a chair.  Hold the incentive spirometer in an upright position.  Breathe out normally.  Place the mouthpiece in your mouth and seal your lips tightly around it.  Breathe in slowly and as deeply as possible, raising the piston or the ball toward the top of the column.  Hold your breath for 3-5 seconds or for as long as possible. Allow the piston or ball to fall to the bottom of the column.  Remove the mouthpiece from your mouth and breathe out normally.  Rest for a few seconds and repeat Steps 1 through 7 at least 10 times every 1-2 hours when you are awake. Take your time and take a few normal breaths between deep breaths.  The spirometer may include an indicator to show your best effort. Use the indicator as a goal to work toward during each repetition.  After each set of 10 deep breaths, practice coughing to be sure your lungs are clear. If you have an incision (the cut made at the time of surgery), support your incision when  coughing by placing a pillow or rolled up towels firmly against it. Once you are able to get out of bed, walk around indoors and cough well. You may stop using the incentive spirometer when instructed by your caregiver.  RISKS AND COMPLICATIONS  Take your time so you do not get dizzy or light-headed.  If you are in pain, you may  need to take or ask for pain medication before doing incentive spirometry. It is harder to take a deep breath if you are having pain. AFTER USE  Rest and breathe slowly and easily.  It can be helpful to keep track of a log of your progress. Your caregiver can provide you with a simple table to help with this. If you are using the spirometer at home, follow these instructions: [place] IF:   You are having difficultly using the spirometer.  You have trouble using the spirometer as often as instructed.  Your pain medication is not giving enough relief while using the spirometer.  You develop fever of 100.5 F (38.1 C) or higher. SEEK IMMEDIATE MEDICAL CARE IF:   You cough up bloody sputum that had not been present before.  You develop fever of 102 F (38.9 C) or greater.  You develop worsening pain at or near the incision site. MAKE SURE YOU:   Understand these instructions.  Will watch your condition.  Will get help right away if you are not doing well or get worse. Document Released: 03/17/2007 Document Revised: 01/27/2012 Document Reviewed: 05/18/2007 ExitCare Patient Information 2014 Exit[employer], [place].   ________________________________________________________________________  WHAT IS A BLOOD TRANSFUSION? Blood Transfusion Information  A transfusion is the replacement of blood or some of its parts. Blood is made up of multiple cells which provide different functions.  Red blood cells carry oxygen and are used for blood loss replacement.  White blood cells fight against infection.  Platelets control bleeding.  Plasma helps clot blood.  Other blood products are available for specialized needs, such as hemophilia or other clotting disorders. BEFORE THE TRANSFUSION  Who gives blood for transfusions?   Healthy volunteers who are fully evaluated to make sure their blood is safe. This is blood bank blood. Transfusion therapy is the safest it has ever been in  the practice of medicine. Before blood is taken from a donor, a complete history is taken to make sure that person has no history of diseases nor engages in risky social behavior (examples are intravenous drug use or sexual activity with multiple partners). The donor's travel history is screened to minimize risk of transmitting infections, such as malaria. The donated blood is tested for signs of infectious diseases, such as HIV and hepatitis. The blood is then tested to be sure it is compatible with you in order to minimize the chance of a transfusion reaction. If you or a relative donates blood, this is often done in anticipation of surgery and is not appropriate for emergency situations. It takes many days to process the donated blood. RISKS AND COMPLICATIONS Although transfusion therapy is very safe and saves many lives, the main dangers of transfusion include:   Getting an infectious disease.  Developing a transfusion reaction. This is an allergic reaction to something in the blood you were given. Every precaution is taken to prevent this. The decision to have a blood transfusion has been considered carefully by your caregiver before blood is given. Blood is not given unless the benefits outweigh the risks. AFTER THE TRANSFUSION  Right after receiving  a blood transfusion, you will usually feel much better and more energetic. This is especially true if your red blood cells have gotten low (anemic). The transfusion raises the level of the red blood cells which carry oxygen, and this usually causes an energy increase.  The nurse administering the transfusion will monitor you carefully for complications. HOME CARE INSTRUCTIONS  No special instructions are needed after a transfusion. You may find your energy is better. Speak with your caregiver about any limitations on activity for underlying diseases you may have. SEEK MEDICAL CARE IF:   Your condition is not improving after your  transfusion.  You develop redness or irritation at the intravenous (IV) site. SEEK IMMEDIATE MEDICAL CARE IF:  Any of the following symptoms occur over the next 12 hours:  Shaking chills.  You have a temperature by mouth above 102 F (38.9 C), not controlled by medicine.  Chest, back, or muscle pain.  People around you feel you are not acting correctly or are confused.  Shortness of breath or difficulty breathing.  Dizziness and fainting.  You get a rash or develop hives.  You have a decrease in urine output.  Your urine turns a dark color or changes to pink, red, or brown. Any of the following symptoms occur over the next 10 days:  You have a temperature by mouth above 102 F (38.9 C), not controlled by medicine.  Shortness of breath.  Weakness after normal activity.  The white part of the eye turns yellow (jaundice).  You have a decrease in the amount of urine or are urinating less often.  Your urine turns a dark color or changes to pink, red, or brown. Document Released: 11/01/2000 Document Revised: 01/27/2012 Document Reviewed: 06/20/2008 [facility] Patient Information 2014 [place], [place].  _______________________________________________________________________

## 2015-05-26 ENCOUNTER — Encounter (HOSPITAL_COMMUNITY)
Admission: RE | Admit: 2015-05-26 | Discharge: 2015-05-26 | Disposition: A | Discharge status: Home / Self Care | Payer: Commercial Managed Care - HMO | Source: Ambulatory Visit | Attending: Orthopedic Surgery | Admitting: Orthopedic Surgery

## 2015-05-26 ENCOUNTER — Encounter (HOSPITAL_COMMUNITY): Payer: Self-pay

## 2015-05-26 DIAGNOSIS — M1712 Unilateral primary osteoarthritis, left knee: Secondary | ICD-10-CM | POA: Diagnosis not present

## 2015-05-26 DIAGNOSIS — Z01812 Encounter for preprocedural laboratory examination: Secondary | ICD-10-CM | POA: Diagnosis not present

## 2015-05-26 DIAGNOSIS — Z01810 Encounter for preprocedural cardiovascular examination: Secondary | ICD-10-CM | POA: Insufficient documentation

## 2015-05-26 HISTORY — DX: Pneumonia, unspecified organism: J18.9

## 2015-05-26 HISTORY — DX: Unspecified convulsions: R56.9

## 2015-05-26 LAB — CBC
HCT: 44.4 % (ref 36.0–46.0)
HEMOGLOBIN: 14.6 g/dL (ref 12.0–15.0)
MCH: 31 pg (ref 26.0–34.0)
MCHC: 32.9 g/dL (ref 30.0–36.0)
MCV: 94.3 fL (ref 78.0–100.0)
Platelets: 279 10*3/uL (ref 150–400)
RBC: 4.71 MIL/uL (ref 3.87–5.11)
RDW: 12.7 % (ref 11.5–15.5)
WBC: 6.6 10*3/uL (ref 4.0–10.5)

## 2015-05-26 LAB — COMPREHENSIVE METABOLIC PANEL
ALBUMIN: 4.3 g/dL (ref 3.5–5.0)
ALT: 14 U/L (ref 14–54)
AST: 21 U/L (ref 15–41)
Alkaline Phosphatase: 77 U/L (ref 38–126)
Anion gap: 8 (ref 5–15)
BILIRUBIN TOTAL: 0.8 mg/dL (ref 0.3–1.2)
BUN: 22 mg/dL — ABNORMAL HIGH (ref 6–20)
CALCIUM: 9.3 mg/dL (ref 8.9–10.3)
CHLORIDE: 107 mmol/L (ref 101–111)
CO2: 26 mmol/L (ref 22–32)
Creatinine, Ser: 1.1 mg/dL — ABNORMAL HIGH (ref 0.44–1.00)
GFR calc Af Amer: 57 mL/min — ABNORMAL LOW (ref >60)
GFR calc non Af Amer: 49 mL/min — ABNORMAL LOW (ref >60)
Glucose, Bld: 84 mg/dL (ref 65–99)
Potassium: 4.4 mmol/L (ref 3.5–5.1)
SODIUM: 141 mmol/L (ref 135–145)
Total Protein: 7.3 g/dL (ref 6.5–8.1)

## 2015-05-26 LAB — PROTIME-INR
INR: 1.02 (ref 0.00–1.49)
Prothrombin Time: 13.6 seconds (ref 11.6–15.2)

## 2015-05-26 LAB — SURGICAL PCR SCREEN
MRSA, PCR: NEGATIVE
Staphylococcus aureus: NEGATIVE

## 2015-05-26 LAB — APTT: aPTT: 31 seconds (ref 24–37)

## 2015-05-26 LAB — ABO/RH: ABO/RH(D): O POS

## 2015-05-26 NOTE — Progress Notes (Signed)
   05/26/15 1130  OBSTRUCTIVE SLEEP APNEA  Have you ever been diagnosed with sleep apnea through a sleep study? No  Do you snore loudly (loud enough to be heard through closed doors)?  1  Do you often feel tired, fatigued, or sleepy during the daytime? 1  Has anyone observed you stop breathing during your sleep? 0  Do you have, or are you being treated for high blood pressure? 1  BMI more than 35 kg/m2? 0  Age over 71 years old? 1  Neck circumference greater than 40 cm/16 inches? 0  Gender: 0  Obstructive Sleep Apnea Score 4

## 2015-05-26 NOTE — Progress Notes (Signed)
05-26-15 - Preop Lab (CMP on 05-25-15) faxed to Dr. Lyla Glassing via [facility]

## 2015-05-26 NOTE — Progress Notes (Signed)
05-10-15 - LOV & Surgical Clearance - Dr. Alain Marion (PCP) - EPIC

## 2015-06-01 ENCOUNTER — Encounter (HOSPITAL_COMMUNITY)
Admission: RE | Disposition: A | Discharge status: Home / Self Care | Payer: Self-pay | Source: Ambulatory Visit | Attending: Orthopedic Surgery

## 2015-06-01 ENCOUNTER — Inpatient Hospital Stay (HOSPITAL_COMMUNITY): Payer: Commercial Managed Care - HMO

## 2015-06-01 ENCOUNTER — Inpatient Hospital Stay (HOSPITAL_COMMUNITY): Payer: Commercial Managed Care - HMO | Admitting: Registered Nurse

## 2015-06-01 ENCOUNTER — Inpatient Hospital Stay (HOSPITAL_COMMUNITY)
Admission: RE | Admit: 2015-06-01 | Discharge: 2015-06-03 | DRG: 470 | Disposition: A | Discharge status: Home / Self Care | Payer: Commercial Managed Care - HMO | Source: Ambulatory Visit | Attending: Orthopedic Surgery | Admitting: Orthopedic Surgery

## 2015-06-01 ENCOUNTER — Encounter (HOSPITAL_COMMUNITY): Payer: Self-pay | Admitting: *Deleted

## 2015-06-01 DIAGNOSIS — Z9109 Other allergy status, other than to drugs and biological substances: Secondary | ICD-10-CM

## 2015-06-01 DIAGNOSIS — Z8249 Family history of ischemic heart disease and other diseases of the circulatory system: Secondary | ICD-10-CM

## 2015-06-01 DIAGNOSIS — Z01810 Encounter for preprocedural cardiovascular examination: Secondary | ICD-10-CM

## 2015-06-01 DIAGNOSIS — Z96652 Presence of left artificial knee joint: Secondary | ICD-10-CM | POA: Diagnosis not present

## 2015-06-01 DIAGNOSIS — M609 Myositis, unspecified: Secondary | ICD-10-CM | POA: Diagnosis present

## 2015-06-01 DIAGNOSIS — G2581 Restless legs syndrome: Secondary | ICD-10-CM | POA: Diagnosis not present

## 2015-06-01 DIAGNOSIS — Z882 Allergy status to sulfonamides status: Secondary | ICD-10-CM | POA: Diagnosis not present

## 2015-06-01 DIAGNOSIS — Z807 Family history of other malignant neoplasms of lymphoid, hematopoietic and related tissues: Secondary | ICD-10-CM | POA: Diagnosis not present

## 2015-06-01 DIAGNOSIS — M87052 Idiopathic aseptic necrosis of left femur: Secondary | ICD-10-CM | POA: Diagnosis present

## 2015-06-01 DIAGNOSIS — F411 Generalized anxiety disorder: Secondary | ICD-10-CM | POA: Diagnosis present

## 2015-06-01 DIAGNOSIS — Z833 Family history of diabetes mellitus: Secondary | ICD-10-CM

## 2015-06-01 DIAGNOSIS — M84462A Pathological fracture, left tibia, initial encounter for fracture: Secondary | ICD-10-CM | POA: Diagnosis not present

## 2015-06-01 DIAGNOSIS — M179 Osteoarthritis of knee, unspecified: Secondary | ICD-10-CM | POA: Diagnosis present

## 2015-06-01 DIAGNOSIS — I10 Essential (primary) hypertension: Secondary | ICD-10-CM | POA: Diagnosis not present

## 2015-06-01 DIAGNOSIS — K589 Irritable bowel syndrome without diarrhea: Secondary | ICD-10-CM | POA: Diagnosis not present

## 2015-06-01 DIAGNOSIS — M8708 Idiopathic aseptic necrosis of bone, other site: Secondary | ICD-10-CM | POA: Diagnosis not present

## 2015-06-01 DIAGNOSIS — M25562 Pain in left knee: Secondary | ICD-10-CM | POA: Diagnosis present

## 2015-06-01 DIAGNOSIS — Z471 Aftercare following joint replacement surgery: Secondary | ICD-10-CM | POA: Diagnosis not present

## 2015-06-01 DIAGNOSIS — Z01812 Encounter for preprocedural laboratory examination: Secondary | ICD-10-CM | POA: Diagnosis not present

## 2015-06-01 DIAGNOSIS — Z823 Family history of stroke: Secondary | ICD-10-CM

## 2015-06-01 DIAGNOSIS — M879 Osteonecrosis, unspecified: Secondary | ICD-10-CM | POA: Diagnosis not present

## 2015-06-01 DIAGNOSIS — Z885 Allergy status to narcotic agent status: Secondary | ICD-10-CM

## 2015-06-01 DIAGNOSIS — M84361A Stress fracture, right tibia, initial encounter for fracture: Secondary | ICD-10-CM | POA: Diagnosis not present

## 2015-06-01 DIAGNOSIS — Z09 Encounter for follow-up examination after completed treatment for conditions other than malignant neoplasm: Secondary | ICD-10-CM

## 2015-06-01 DIAGNOSIS — Z853 Personal history of malignant neoplasm of breast: Secondary | ICD-10-CM

## 2015-06-01 HISTORY — PX: TOTAL KNEE ARTHROPLASTY: SHX125

## 2015-06-01 LAB — TYPE AND SCREEN
ABO/RH(D): O POS
ANTIBODY SCREEN: NEGATIVE

## 2015-06-01 LAB — GLUCOSE, CAPILLARY
GLUCOSE-CAPILLARY: 136 mg/dL — AB (ref 65–99)
Glucose-Capillary: 128 mg/dL — ABNORMAL HIGH (ref 65–99)
Glucose-Capillary: 193 mg/dL — ABNORMAL HIGH (ref 65–99)

## 2015-06-01 SURGERY — ARTHROPLASTY, KNEE, TOTAL
Anesthesia: Spinal | Site: Knee | Laterality: Left

## 2015-06-01 MED ORDER — SODIUM CHLORIDE 0.9 % IJ SOLN
INTRAMUSCULAR | Status: DC | PRN
  Administered 2015-06-01: 30 mL

## 2015-06-01 MED ORDER — CHLORHEXIDINE GLUCONATE 4 % EX LIQD: 60.0000 mL | Freq: Once | CUTANEOUS | Status: DC

## 2015-06-01 MED ORDER — ACETAMINOPHEN 325 MG PO TABS: 650.0000 mg | ORAL_TABLET | Freq: Four times a day (QID) | ORAL | Status: DC | PRN

## 2015-06-01 MED ORDER — CEFAZOLIN SODIUM-DEXTROSE 2-3 GM-% IV SOLR
2.0000 g | Freq: Four times a day (QID) | INTRAVENOUS | Status: AC
  Administered 2015-06-01 – 2015-06-02 (×2): 2 g via INTRAVENOUS
  Filled 2015-06-01 (×2): qty 50

## 2015-06-01 MED ORDER — DIPHENHYDRAMINE HCL 50 MG/ML IJ SOLN
INTRAMUSCULAR | Status: AC
  Filled 2015-06-01: qty 1

## 2015-06-01 MED ORDER — METHOCARBAMOL 1000 MG/10ML IJ SOLN
500.0000 mg | Freq: Four times a day (QID) | INTRAVENOUS | Status: DC | PRN
  Administered 2015-06-01: 500 mg via INTRAVENOUS
  Filled 2015-06-01 (×2): qty 5

## 2015-06-01 MED ORDER — BUPIVACAINE HCL (PF) 0.5 % IJ SOLN
INTRAMUSCULAR | Status: AC
  Filled 2015-06-01: qty 30

## 2015-06-01 MED ORDER — SODIUM CHLORIDE 0.9 % IR SOLN
Status: DC | PRN
  Administered 2015-06-01: 500 mL

## 2015-06-01 MED ORDER — LORATADINE 10 MG PO TABS
10.0000 mg | ORAL_TABLET | Freq: Every day | ORAL | Status: DC | PRN
  Filled 2015-06-01: qty 1

## 2015-06-01 MED ORDER — ONDANSETRON HCL 4 MG/2ML IJ SOLN: 4.0000 mg | Freq: Four times a day (QID) | INTRAMUSCULAR | Status: DC | PRN

## 2015-06-01 MED ORDER — SODIUM CHLORIDE 0.9 % IV SOLN: INTRAVENOUS | Status: DC

## 2015-06-01 MED ORDER — KETOROLAC TROMETHAMINE 30 MG/ML IJ SOLN
INTRAMUSCULAR | Status: DC | PRN
  Administered 2015-06-01: 30 mg via INTRAVENOUS

## 2015-06-01 MED ORDER — MEPERIDINE HCL 50 MG/ML IJ SOLN: 6.2500 mg | INTRAMUSCULAR | Status: DC | PRN

## 2015-06-01 MED ORDER — BUPIVACAINE-EPINEPHRINE (PF) 0.25% -1:200000 IJ SOLN
INTRAMUSCULAR | Status: DC | PRN
  Administered 2015-06-01: 30 mL

## 2015-06-01 MED ORDER — METOCLOPRAMIDE HCL 5 MG/ML IJ SOLN: 5.0000 mg | Freq: Three times a day (TID) | INTRAMUSCULAR | Status: DC | PRN

## 2015-06-01 MED ORDER — DULOXETINE HCL 60 MG PO CPEP
60.0000 mg | ORAL_CAPSULE | Freq: Every day | ORAL | Status: DC
  Administered 2015-06-02 – 2015-06-03 (×2): 60 mg via ORAL
  Filled 2015-06-01 (×2): qty 1

## 2015-06-01 MED ORDER — SODIUM CHLORIDE 0.9 % IJ SOLN
INTRAMUSCULAR | Status: AC
  Filled 2015-06-01: qty 50

## 2015-06-01 MED ORDER — DEXAMETHASONE SODIUM PHOSPHATE 4 MG/ML IJ SOLN
4.0000 mg | INTRAMUSCULAR | Status: AC
  Administered 2015-06-01: 10 mg via INTRAVENOUS
  Filled 2015-06-01: qty 1

## 2015-06-01 MED ORDER — PROPOFOL 10 MG/ML IV BOLUS
INTRAVENOUS | Status: AC
  Filled 2015-06-01: qty 20

## 2015-06-01 MED ORDER — LIDOCAINE HCL (CARDIAC) 20 MG/ML IV SOLN
INTRAVENOUS | Status: DC | PRN
  Administered 2015-06-01: 100 mg via INTRAVENOUS

## 2015-06-01 MED ORDER — SENNA 8.6 MG PO TABS: 2.0000 | ORAL_TABLET | Freq: Every day | ORAL | Status: DC

## 2015-06-01 MED ORDER — ASPIRIN EC 325 MG PO TBEC
325.0000 mg | DELAYED_RELEASE_TABLET | Freq: Two times a day (BID) | ORAL | Status: DC
  Administered 2015-06-02 – 2015-06-03 (×3): 325 mg via ORAL
  Filled 2015-06-01 (×5): qty 1

## 2015-06-01 MED ORDER — HYDROMORPHONE HCL 1 MG/ML IJ SOLN
0.5000 mg | INTRAMUSCULAR | Status: DC | PRN
  Administered 2015-06-01 – 2015-06-02 (×2): 0.5 mg via INTRAVENOUS
  Filled 2015-06-01 (×2): qty 1

## 2015-06-01 MED ORDER — OXYCODONE HCL 5 MG PO TABS
5.0000 mg | ORAL_TABLET | ORAL | Status: DC | PRN
  Administered 2015-06-01: 5 mg via ORAL
  Administered 2015-06-01 – 2015-06-02 (×3): 10 mg via ORAL
  Filled 2015-06-01: qty 1
  Filled 2015-06-01 (×3): qty 2

## 2015-06-01 MED ORDER — DEXAMETHASONE SODIUM PHOSPHATE 10 MG/ML IJ SOLN
10.0000 mg | Freq: Once | INTRAMUSCULAR | Status: AC
  Administered 2015-06-02: 10 mg via INTRAVENOUS
  Filled 2015-06-01: qty 1

## 2015-06-01 MED ORDER — ONDANSETRON HCL 4 MG/2ML IJ SOLN
INTRAMUSCULAR | Status: AC
Start: 2015-06-01 — End: 2015-06-01
  Filled 2015-06-01: qty 2

## 2015-06-01 MED ORDER — HYDROGEN PEROXIDE 3 % EX SOLN
CUTANEOUS | Status: DC | PRN
  Administered 2015-06-01: 1

## 2015-06-01 MED ORDER — FENTANYL CITRATE (PF) 100 MCG/2ML IJ SOLN
INTRAMUSCULAR | Status: AC
  Filled 2015-06-01: qty 2

## 2015-06-01 MED ORDER — LOSARTAN POTASSIUM 50 MG PO TABS
100.0000 mg | ORAL_TABLET | Freq: Every day | ORAL | Status: DC
  Administered 2015-06-01 – 2015-06-03 (×3): 100 mg via ORAL
  Filled 2015-06-01 (×3): qty 2

## 2015-06-01 MED ORDER — ONDANSETRON HCL 4 MG/2ML IJ SOLN: 4.0000 mg | INTRAMUSCULAR | Status: DC

## 2015-06-01 MED ORDER — BUPIVACAINE-EPINEPHRINE (PF) 0.25% -1:200000 IJ SOLN
INTRAMUSCULAR | Status: AC
  Filled 2015-06-01: qty 30

## 2015-06-01 MED ORDER — ALUM & MAG HYDROXIDE-SIMETH 200-200-20 MG/5ML PO SUSP: 30.0000 mL | ORAL | Status: DC | PRN

## 2015-06-01 MED ORDER — PROPOFOL INFUSION 10 MG/ML OPTIME
INTRAVENOUS | Status: DC | PRN
  Administered 2015-06-01: 75 ug/kg/min via INTRAVENOUS

## 2015-06-01 MED ORDER — ACETAMINOPHEN 10 MG/ML IV SOLN
1000.0000 mg | INTRAVENOUS | Status: AC
  Administered 2015-06-01: 1000 mg via INTRAVENOUS

## 2015-06-01 MED ORDER — SODIUM CHLORIDE 0.9 % IR SOLN
Status: DC | PRN
  Administered 2015-06-01: 1000 mL

## 2015-06-01 MED ORDER — MENTHOL 3 MG MT LOZG: 1.0000 | LOZENGE | OROMUCOSAL | Status: DC | PRN

## 2015-06-01 MED ORDER — CHLORHEXIDINE GLUCONATE 4 % EX LIQD
60.0000 mL | Freq: Once | CUTANEOUS | Status: DC
Start: 2015-06-01 — End: 2015-06-01

## 2015-06-01 MED ORDER — METHOCARBAMOL 500 MG PO TABS
500.0000 mg | ORAL_TABLET | Freq: Four times a day (QID) | ORAL | Status: DC | PRN
  Administered 2015-06-02 (×2): 500 mg via ORAL
  Filled 2015-06-01 (×3): qty 1

## 2015-06-01 MED ORDER — CEFAZOLIN SODIUM-DEXTROSE 2-3 GM-% IV SOLR
INTRAVENOUS | Status: AC
  Filled 2015-06-01: qty 50

## 2015-06-01 MED ORDER — DOCUSATE SODIUM 100 MG PO CAPS: 100.0000 mg | ORAL_CAPSULE | Freq: Two times a day (BID) | ORAL | Status: DC

## 2015-06-01 MED ORDER — DIPHENHYDRAMINE HCL 12.5 MG/5ML PO ELIX
12.5000 mg | ORAL_SOLUTION | ORAL | Status: DC | PRN
  Administered 2015-06-02: 25 mg via ORAL
  Filled 2015-06-01 (×2): qty 10

## 2015-06-01 MED ORDER — FENTANYL CITRATE (PF) 100 MCG/2ML IJ SOLN
25.0000 ug | INTRAMUSCULAR | Status: DC | PRN
  Administered 2015-06-01 (×2): 50 ug via INTRAVENOUS

## 2015-06-01 MED ORDER — ONDANSETRON 8 MG PO TBDP: 8.0000 mg | ORAL_TABLET | Freq: Three times a day (TID) | ORAL | Status: DC | PRN

## 2015-06-01 MED ORDER — LIDOCAINE HCL (CARDIAC) 20 MG/ML IV SOLN
INTRAVENOUS | Status: AC
  Filled 2015-06-01: qty 5

## 2015-06-01 MED ORDER — LACTATED RINGERS IV SOLN
INTRAVENOUS | Status: DC
  Administered 2015-06-01: 1000 mL via INTRAVENOUS
  Administered 2015-06-01: 15:00:00 via INTRAVENOUS

## 2015-06-01 MED ORDER — ONDANSETRON HCL 4 MG PO TABS: 4.0000 mg | ORAL_TABLET | Freq: Four times a day (QID) | ORAL | Status: DC | PRN

## 2015-06-01 MED ORDER — PROMETHAZINE HCL 25 MG/ML IJ SOLN: 6.2500 mg | INTRAMUSCULAR | Status: DC | PRN

## 2015-06-01 MED ORDER — METOCLOPRAMIDE HCL 10 MG PO TABS: 5.0000 mg | ORAL_TABLET | Freq: Three times a day (TID) | ORAL | Status: DC | PRN

## 2015-06-01 MED ORDER — METOPROLOL TARTRATE 1 MG/ML IV SOLN
INTRAVENOUS | Status: DC | PRN
  Administered 2015-06-01: 1 mg via INTRAVENOUS

## 2015-06-01 MED ORDER — ISOPROPYL ALCOHOL 70 % SOLN
Status: AC
  Filled 2015-06-01: qty 480

## 2015-06-01 MED ORDER — SODIUM CHLORIDE 0.9 % IR SOLN
Status: DC | PRN
  Administered 2015-06-01: 3000 mL

## 2015-06-01 MED ORDER — MIDAZOLAM HCL 2 MG/2ML IJ SOLN
INTRAMUSCULAR | Status: AC
  Filled 2015-06-01: qty 2

## 2015-06-01 MED ORDER — AMLODIPINE BESYLATE 2.5 MG PO TABS
2.5000 mg | ORAL_TABLET | Freq: Every day | ORAL | Status: DC
  Administered 2015-06-02 – 2015-06-03 (×2): 2.5 mg via ORAL
  Filled 2015-06-01 (×2): qty 1

## 2015-06-01 MED ORDER — FENTANYL CITRATE (PF) 100 MCG/2ML IJ SOLN
INTRAMUSCULAR | Status: DC | PRN
  Administered 2015-06-01 (×2): 25 ug via INTRAVENOUS
  Administered 2015-06-01: 50 ug via INTRAVENOUS

## 2015-06-01 MED ORDER — OXYCODONE-ACETAMINOPHEN 5-325 MG PO TABS: 1.0000 | ORAL_TABLET | ORAL | Status: DC | PRN

## 2015-06-01 MED ORDER — ISOPROPYL ALCOHOL 70 % SOLN
Status: DC | PRN
  Administered 2015-06-01: 1 via TOPICAL

## 2015-06-01 MED ORDER — TRANEXAMIC ACID 1000 MG/10ML IV SOLN
1000.0000 mg | INTRAVENOUS | Status: AC
  Administered 2015-06-01: 1000 mg via INTRAVENOUS
  Filled 2015-06-01: qty 10

## 2015-06-01 MED ORDER — PHENOL 1.4 % MT LIQD: 1.0000 | OROMUCOSAL | Status: DC | PRN

## 2015-06-01 MED ORDER — GABAPENTIN 300 MG PO CAPS
600.0000 mg | ORAL_CAPSULE | Freq: Every morning | ORAL | Status: DC
  Administered 2015-06-01 – 2015-06-03 (×3): 600 mg via ORAL
  Filled 2015-06-01 (×3): qty 2

## 2015-06-01 MED ORDER — KETOROLAC TROMETHAMINE 30 MG/ML IJ SOLN
INTRAMUSCULAR | Status: AC
  Filled 2015-06-01: qty 1

## 2015-06-01 MED ORDER — DOCUSATE SODIUM 100 MG PO CAPS
100.0000 mg | ORAL_CAPSULE | Freq: Two times a day (BID) | ORAL | Status: DC
  Administered 2015-06-01 – 2015-06-03 (×4): 100 mg via ORAL

## 2015-06-01 MED ORDER — ACETAMINOPHEN 10 MG/ML IV SOLN
INTRAVENOUS | Status: AC
  Filled 2015-06-01: qty 100

## 2015-06-01 MED ORDER — DEXAMETHASONE SODIUM PHOSPHATE 10 MG/ML IJ SOLN
INTRAMUSCULAR | Status: AC
  Filled 2015-06-01: qty 1

## 2015-06-01 MED ORDER — HYDROGEN PEROXIDE 3 % EX SOLN
CUTANEOUS | Status: AC
  Filled 2015-06-01: qty 473

## 2015-06-01 MED ORDER — ACETAMINOPHEN 650 MG RE SUPP: 650.0000 mg | Freq: Four times a day (QID) | RECTAL | Status: DC | PRN

## 2015-06-01 MED ORDER — BUPIVACAINE HCL (PF) 0.5 % IJ SOLN
INTRAMUSCULAR | Status: DC | PRN
  Administered 2015-06-01: 3 mL

## 2015-06-01 MED ORDER — MIDAZOLAM HCL 5 MG/5ML IJ SOLN
INTRAMUSCULAR | Status: DC | PRN
  Administered 2015-06-01 (×2): 1 mg via INTRAVENOUS

## 2015-06-01 MED ORDER — POVIDONE-IODINE 10 % EX SOLN
CUTANEOUS | Status: DC | PRN
  Administered 2015-06-01: 1 via TOPICAL

## 2015-06-01 MED ORDER — KETOROLAC TROMETHAMINE 15 MG/ML IJ SOLN
15.0000 mg | Freq: Four times a day (QID) | INTRAMUSCULAR | Status: AC
  Administered 2015-06-01 – 2015-06-02 (×4): 15 mg via INTRAVENOUS
  Filled 2015-06-01 (×5): qty 1

## 2015-06-01 MED ORDER — ASPIRIN EC 325 MG PO TBEC: 325.0000 mg | DELAYED_RELEASE_TABLET | Freq: Two times a day (BID) | ORAL | Status: DC

## 2015-06-01 MED ORDER — SENNA 8.6 MG PO TABS
2.0000 | ORAL_TABLET | Freq: Every day | ORAL | Status: DC
Start: 2015-06-01 — End: 2015-06-03
  Administered 2015-06-01 – 2015-06-02 (×2): 17.2 mg via ORAL

## 2015-06-01 MED ORDER — DIPHENHYDRAMINE HCL 50 MG/ML IJ SOLN
25.0000 mg | Freq: Once | INTRAMUSCULAR | Status: AC
  Administered 2015-06-01: 25 mg via INTRAVENOUS

## 2015-06-01 MED ORDER — CEFAZOLIN SODIUM-DEXTROSE 2-3 GM-% IV SOLR
2.0000 g | INTRAVENOUS | Status: AC
  Administered 2015-06-01: 2 g via INTRAVENOUS

## 2015-06-01 MED ORDER — ONDANSETRON HCL 4 MG/2ML IJ SOLN
INTRAMUSCULAR | Status: DC | PRN
  Administered 2015-06-01: 4 mg via INTRAVENOUS

## 2015-06-01 MED ORDER — MELOXICAM 15 MG PO TABS
15.0000 mg | ORAL_TABLET | Freq: Every day | ORAL | Status: DC
Start: 2015-06-01 — End: 2015-09-12

## 2015-06-01 MED ORDER — METFORMIN HCL 500 MG PO TABS
500.0000 mg | ORAL_TABLET | Freq: Every day | ORAL | Status: DC
  Administered 2015-06-02 – 2015-06-03 (×2): 500 mg via ORAL
  Filled 2015-06-01 (×3): qty 1

## 2015-06-01 MED ORDER — PANTOPRAZOLE SODIUM 40 MG PO TBEC
80.0000 mg | DELAYED_RELEASE_TABLET | Freq: Every day | ORAL | Status: DC
  Administered 2015-06-02 – 2015-06-03 (×2): 80 mg via ORAL
  Filled 2015-06-01 (×3): qty 2

## 2015-06-01 SURGICAL SUPPLY — 61 items
BAG ZIPLOCK 12X15 (MISCELLANEOUS) IMPLANT
BANDAGE ELASTIC 4 VELCRO ST LF (GAUZE/BANDAGES/DRESSINGS) ×2 IMPLANT
BANDAGE ELASTIC 6 VELCRO ST LF (GAUZE/BANDAGES/DRESSINGS) ×2 IMPLANT
BANDAGE ESMARK 6X9 LF (GAUZE/BANDAGES/DRESSINGS) ×1 IMPLANT
BLADE SAW RECIPROCATING 77.5 (BLADE) ×2 IMPLANT
BNDG ESMARK 6X9 LF (GAUZE/BANDAGES/DRESSINGS) ×2
BONE CEMENT SIMPLEX TOBRAMYCIN (Cement) ×3 IMPLANT
CAPT KNEE TOTAL 3 ×2 IMPLANT
CEMENT BONE SIMPLEX TOBRAMYCIN (Cement) ×3 IMPLANT
CEMENT RESTRICTOR DEPUY SZ 4 (Cement) ×4 IMPLANT
CHLORAPREP W/TINT 26ML (MISCELLANEOUS) ×4 IMPLANT
CUFF TOURN SGL QUICK 34 (TOURNIQUET CUFF) ×2
CUFF TRNQT CYL 34X4X40X1 (TOURNIQUET CUFF) ×1 IMPLANT
DECANTER SPIKE VIAL GLASS SM (MISCELLANEOUS) ×2 IMPLANT
DRAPE EXTREMITY T 121X128X90 (DRAPE) ×2 IMPLANT
DRAPE LG THREE QUARTER DISP (DRAPES) ×2 IMPLANT
DRAPE POUCH INSTRU U-SHP 10X18 (DRAPES) ×2 IMPLANT
DRAPE U-SHAPE 47X51 STRL (DRAPES) ×2 IMPLANT
DRSG AQUACEL AG ADV 3.5X10 (GAUZE/BANDAGES/DRESSINGS) ×2 IMPLANT
DRSG TEGADERM 4X4.75 (GAUZE/BANDAGES/DRESSINGS) IMPLANT
ELECT PENCIL ROCKER SW 15FT (MISCELLANEOUS) ×2 IMPLANT
ELECT REM PT RETURN 15FT ADLT (MISCELLANEOUS) ×2 IMPLANT
EVACUATOR 1/8 PVC DRAIN (DRAIN) IMPLANT
FACESHIELD WRAPAROUND (MASK) ×6 IMPLANT
GAUZE SPONGE 4X4 12PLY STRL (GAUZE/BANDAGES/DRESSINGS) ×2 IMPLANT
GLOVE BIO SURGEON STRL SZ8.5 (GLOVE) ×4 IMPLANT
GLOVE BIOGEL PI IND STRL 8.5 (GLOVE) ×1 IMPLANT
GLOVE BIOGEL PI INDICATOR 8.5 (GLOVE) ×1
GOWN SPEC L3 XXLG W/TWL (GOWN DISPOSABLE) ×2 IMPLANT
HANDPIECE INTERPULSE COAX TIP (DISPOSABLE) ×1
HOOD PEEL AWAY FACE SHEILD DIS (HOOD) ×4 IMPLANT
KIT BASIN OR (CUSTOM PROCEDURE TRAY) ×2 IMPLANT
LIQUID BAND (GAUZE/BANDAGES/DRESSINGS) ×2 IMPLANT
MANIFOLD NEPTUNE II (INSTRUMENTS) ×2 IMPLANT
NEEDLE SPNL 18GX3.5 QUINCKE PK (NEEDLE) ×2 IMPLANT
PACK TOTAL JOINT (CUSTOM PROCEDURE TRAY) ×2 IMPLANT
PADDING CAST COTTON 6X4 STRL (CAST SUPPLIES) ×2 IMPLANT
PEN SKIN MARKING BROAD (MISCELLANEOUS) ×2 IMPLANT
POSITIONER SURGICAL ARM (MISCELLANEOUS) ×2 IMPLANT
SAW OSC TIP CART 19.5X105X1.3 (SAW) ×2 IMPLANT
SEALER BIPOLAR AQUA 6.0 (INSTRUMENTS) ×2 IMPLANT
SET HNDPC FAN SPRY TIP SCT (DISPOSABLE) ×1 IMPLANT
SET PAD KNEE POSITIONER (MISCELLANEOUS) ×2 IMPLANT
SOL PREP POV-IOD 4OZ 10% (MISCELLANEOUS) ×2 IMPLANT
SPONGE DRAIN TRACH 4X4 STRL 2S (GAUZE/BANDAGES/DRESSINGS) ×2 IMPLANT
STEM CEMENTED TRIATHLON (Stem) ×2 IMPLANT
SUCTION FRAZIER 12FR DISP (SUCTIONS) IMPLANT
SUT MNCRL AB 3-0 PS2 18 (SUTURE) ×2 IMPLANT
SUT MON AB 2-0 CT1 36 (SUTURE) ×4 IMPLANT
SUT VIC AB 1 CT1 36 (SUTURE) ×2 IMPLANT
SUT VIC AB 2-0 CT1 27 (SUTURE) ×1
SUT VIC AB 2-0 CT1 TAPERPNT 27 (SUTURE) ×1 IMPLANT
SUT VLOC 180 0 24IN GS25 (SUTURE) ×2 IMPLANT
SYR 50ML LL SCALE MARK (SYRINGE) ×2 IMPLANT
TOWEL OR 17X26 10 PK STRL BLUE (TOWEL DISPOSABLE) ×4 IMPLANT
TOWEL OR NON WOVEN STRL DISP B (DISPOSABLE) ×2 IMPLANT
TOWER CARTRIDGE SMART MIX (DISPOSABLE) IMPLANT
TRAY FOLEY BAG SILVER LF 16FR (CATHETERS) ×2 IMPLANT
WATER STERILE IRR 1500ML POUR (IV SOLUTION) ×2 IMPLANT
WRAP KNEE MAXI GEL POST OP (GAUZE/BANDAGES/DRESSINGS) ×2 IMPLANT
YANKAUER SUCT BULB TIP 10FT TU (MISCELLANEOUS) ×2 IMPLANT

## 2015-06-01 NOTE — Progress Notes (Signed)
X-ray results noted 

## 2015-06-01 NOTE — Progress Notes (Signed)
Portable AP and Lateral Left Knee X-rays done. 

## 2015-06-01 NOTE — Op Note (Signed)
OPERATIVE REPORT  SURGEON: Rod Can, MD   ASSISTANT: Jennette Bill, PA-C. Roberto Scales, RNFA.  PREOPERATIVE DIAGNOSIS: Avascular necrosis Left knee.   POSTOPERATIVE DIAGNOSIS: Avascular necrosis Left knee.   PROCEDURE: Left total knee arthroplasty.   IMPLANTS: Stryker Triathlon CR femur, size 5. Stryker universal tibia, size 4 with 12 x 50 mm stem. X3 polyethelyene insert, size 9 mm, CR. 3 button asymmetric patella, size 32 mm. Simplex P bone cement.  ANESTHESIA:  Spinal  TOURNIQUET TIME: 26 min at 351mm Hg.  ESTIMATED BLOOD LOSS: 250 mL.  ANTIBIOTICS: 2g ancef.  DRAINS: Medium HV x1.  COMPLICATIONS: None   CONDITION: PACU - hemodynamically stable.   BRIEF CLINICAL NOTE: Claudia [name] is a 69 y.o. female with a long-standing history of Left knee avascular necrosis. After failing conservative management, the patient was indicated for total knee arthroplasty. The risks, benefits, and alternatives to the procedure were explained, and the patient elected to proceed.  PROCEDURE IN DETAIL: Spinal anesthesia was obtained in the pre-op holding area. Once inside the operative room, a foley catheter was inserted. The patient was then positioned, a nonsterile tourniquet was placed, and the lower extremity was prepped and draped in the normal sterile surgical fashion. A time-out was called verifying side and site of surgery. The patient received IV antibiotics within 60 minutes of beginning the procedure. The extremity was exsanguinated with an Esmarch, and the tourniquet was inflated.  An anterior approach to the knee was performed utilizing a midvastus arthrotomy. A medial release was performed and she was noted to have a displaced fracture of the periphery of the medial plateau with collapse. The patellar fat pad was excised. A 5-degree distal femur valgus cut was made with an intramedullary guide. A freehand patellar resection was performed, and the patella was  sized an prepared with 3 lug holes.  The proximal tibial resection was performed using an extramedullary guide, leaving a bone island for the PCL. The menisci were excised. A spacer block was placed, and the alignment and balance in extension were confirmed.   The distal femur was sized using the 3-degree external rotation guide referencing the posterior femoral cortex. The appropriate 4-in-1 cutting block was pinned into place, and the rotation was checked using a spacer block. The remaining femoral cuts were performed, taking care to protect the MCL.  The tibia was sized and the trial tray was pinned into place. The remaining trail components were inserted. The knee was stable to varus and valgus stress through a full range of motion. The patella tracked centrally, and the PCL was well balanced. The trial components were removed, and the proximal tibial surface was prepared. A cement restrictor was placed. Small drill holes were made in the sclerotic subchondral bone.The cut bony surfaces were irrigated with pulse lavage. Final components were cemented into place and excess cement was cleared. The trial insert was placed, and the knee was brought into extension while the cement polymerized. Once the cement was hard, the knee was tested for a final time and found to be well balanced. The trial insert was exchanged for the real polyethylene insert.  The wound was copiously irrigated with a dilute betadine solution followed by normal saline with pulse lavage. Marcaine solution was injected into the periarticular soft tissue. The wound was closed in layers using #1 Vicryl and V-Loc for the fascia, 2-0 Vicryl for the subcutaneous fat, 2-0 Monocryl for the deep dermal layer, 3-0 running Monocryl subcuticular Stitch, and Dermabond for the skin. Once  the glue was fully dried, an Aquacell Ag and compressive dressing were applied. The tourniquet was let down, and the patient was transported to the recovery room  in stable ondition. Sponge, needle, and instrument counts were correct at the end of the case x2. The patient tolerated the procedure well and there were no known complications.  Please note that a surgical assistant was a medical necessity for this procedure in order to perform it in a safe and expeditious manner. Surgical assistant was necessary to retract the ligaments and vital neurovascular structures to prevent injury to them and also necessary for proper positioning of the limb to allow for anatomic placement of the prosthesis.

## 2015-06-01 NOTE — Progress Notes (Signed)
Physician Discharge Summary  Patient ID: Claudia [name] MRN: [id number] DOB/AGE: 71-May-1947 71 y.o.  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 Osteoarthritis   . Neuralgia of chest     post op  . Internal hemorrhoids without mention of complication   . Irritable bowel syndrome   . Stricture and stenosis of esophagus   . Hiatal hernia   . Chest pain, unspecified   . Acute upper respiratory infections of unspecified site   . Other B-complex deficiencies   . Other malaise and fatigue   . Osteoarthrosis, unspecified whether generalized or localized, unspecified site   . Cramp of limb   . Depressive disorder, not elsewhere classified   . Personal history of malignant neoplasm of breast   . Anxiety state, unspecified   . Neuralgia, neuritis, and radiculitis, unspecified   . Headache(784.0)   . Restless legs syndrome (RLS)   . Insomnia, unspecified   . Myalgia and myositis, unspecified   . Unspecified essential hypertension   . History of cold sores   . Cancer     breast  . Pneumonia     hx of  . Seizures     "mini seizures" 15 yrs. ago. Pt. states did have work up and was normal    Surgeries: Procedure(s): LEFT TOTAL KNEE ARTHROPLASTY on 06/01/2015   Consultants (if any):    Discharged Condition: Improved  Hospital Course: Claudia [name] is an 69 y.o. 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    She was given perioperative antibiotics:      Anti-infectives    Start     Dose/Rate Route Frequency Ordered Stop   06/01/15 2000  ceFAZolin (ANCEF) IVPB 2 g/50 mL premix     2 g 100 mL/hr over 30 Minutes  Intravenous Every 6 hours 06/01/15 1803 06/02/15 0151   06/01/15 0921  ceFAZolin (ANCEF) IVPB 2 g/50 mL premix     2 g 100 mL/hr over 30 Minutes Intravenous On call to O.R. 06/01/15 4098 06/01/15 1330    .  She was given sequential compression devices, early ambulation, and ASA for DVT prophylaxis.  She benefited maximally from the hospital stay and there were no complications.    Recent vital signs:  Filed Vitals:   06/03/15 1115  BP: 124/44  Pulse: 73  Temp:   Resp:     Recent laboratory studies:  Lab Results  Component Value Date   HGB 11.2* 06/03/2015   HGB 11.0* 06/02/2015   HGB 14.6 05/26/2015   Lab Results  Component Value Date   WBC 12.6* 06/03/2015   PLT 316 06/03/2015   Lab Results  Component Value Date   INR 1.02 05/26/2015   Lab Results  Component Value Date   NA 136 06/02/2015   K 4.6 06/02/2015   CL 106 06/02/2015   CO2 26 06/02/2015   BUN 22* 06/02/2015   CREATININE 1.12* 06/02/2015   GLUCOSE 145* 06/02/2015    Discharge Medications:     Medication List    STOP taking these medications        cholecalciferol 1000 UNITS tablet  Commonly known as:  VITAMIN  D     cyanocobalamin 1000 MCG/ML injection  Commonly known as:  (VITAMIN B-12)     OCUVITE ADULT 50+ Caps     VITAMIN B 12 PO      TAKE these medications        acetaminophen 500 MG tablet  Commonly known as:  TYLENOL  Take 500 mg by mouth every 6 (six) hours as needed.     amLODipine 2.5 MG tablet  Commonly known as:  NORVASC  Take 1 tablet (2.5 mg total) by mouth daily.     aspirin EC 325 MG tablet  Take 1 tablet (325 mg total) by mouth 2 (two) times daily after a meal.     docusate sodium 100 MG capsule  Commonly known as:  COLACE  Take 1 capsule (100 mg total) by mouth 2 (two) times daily.     DULoxetine 60 MG capsule  Commonly known as:  CYMBALTA  Take 1 capsule (60 mg total) by mouth daily. For depression     gabapentin 600 MG tablet  Commonly known as:   NEURONTIN  TAKE 1 TABLET THREE TIMES DAILY     HYDROmorphone 2 MG tablet  Commonly known as:  DILAUDID  Take 1-2 tablets (2-4 mg total) by mouth every 3 (three) hours as needed for moderate pain or severe pain.     INSULIN SYRINGE 1CC/29G 29G X 1/2" 1 ML Misc  Commonly known as:  B-D INSULIN SYRINGE  As dirrected     loratadine 10 MG tablet  Commonly known as:  CLARITIN  Take 1 tablet (10 mg total) by mouth daily. As needed for allergies     losartan 100 MG tablet  Commonly known as:  COZAAR  Take 1 tablet (100 mg total) by mouth daily.     meloxicam 15 MG tablet  Commonly known as:  MOBIC  Take 1 tablet (15 mg total) by mouth daily.     metFORMIN 500 MG tablet  Commonly known as:  GLUCOPHAGE  Take 1 tablet (500 mg total) by mouth daily with breakfast.     methocarbamol 500 MG tablet  Commonly known as:  ROBAXIN  Take 1 tablet (500 mg total) by mouth every 6 (six) hours as needed for muscle spasms.     omeprazole 40 MG capsule  Commonly known as:  PRILOSEC  Take 1 capsule (40 mg total) by mouth 2 (two) times daily.     ondansetron 8 MG disintegrating tablet  Commonly known as:  ZOFRAN ODT  Take 1 tablet (8 mg total) by mouth every 8 (eight) hours as needed for nausea or vomiting.     oxyCODONE-acetaminophen 5-325 MG per tablet  Commonly known as:  ROXICET  Take 1-2 tablets by mouth every 4 (four) hours as needed for severe pain.     polyvinyl alcohol 1.4 % ophthalmic solution  Commonly known as:  LIQUIFILM TEARS  Place 1-2 drops into both eyes daily as needed for dry eyes.     senna 8.6 MG Tabs tablet  Commonly known as:  SENOKOT  Take 2 tablets (17.2 mg total) by mouth at bedtime.     valACYclovir 500 MG tablet  Commonly known as:  VALTREX  Take 1 tablet (500 mg total) by mouth 2 (two) times daily.        Diagnostic Studies: Dg Knee Left Port  06/01/2015   CLINICAL DATA:  Postop left knee surgery  EXAM: PORTABLE LEFT KNEE - 1-2 VIEW  COMPARISON:  Left knee  films of 01/27/2015 and MR left knee of 02/09/2015  FINDINGS: The femoral and tibial components of the left total knee replacement are in good position and alignment on the images obtained. No complicating features are seen. There is some air in the soft tissues and in the joint space postoperatively with a surgical drain present.  IMPRESSION: Left total knee components in good position and alignment. No complicating features.   Electronically Signed   By: Dwyane Dee M.D.   On: 06/01/2015 16:58    Disposition: 01-Home or Self Care  Discharge Instructions    Call MD / Call 911    Complete by:  As directed   If you experience chest pain or shortness of breath, CALL 911 and be transported to the hospital emergency room.  If you develope a fever above 101 F, pus (white drainage) or increased drainage or redness at the wound, or calf pain, call your surgeon's office.     Change dressing    Complete by:  As directed   Change dressing daily with sterile 4 x 4 inch gauze dressing and apply TED hose. Do not submerge the incision under water.     Constipation Prevention    Complete by:  As directed   Drink plenty of fluids.  Prune juice may be helpful.  You may use a stool softener, such as Colace (over the counter) 100 mg twice a day.  Use MiraLax (over the counter) for constipation as needed.     Diet - low sodium heart healthy    Complete by:  As directed      Discharge instructions    Complete by:  As directed   Pick up stool softner and laxative for home use following surgery while on pain medications. Do not submerge incision under water. May shower starting three days after surgery. Please use a clean towel to pat the dressing dry following showers. Continue to use ice for pain and swelling after surgery.  Take a full dose 325 mg Aspirin twice a day for one month.  Postoperative Constipation Protocol  Constipation - defined medically as fewer than three stools per week and severe  constipation as less than one stool per week.  One of the most common issues patients have following surgery is constipation.  Even if you have a regular bowel pattern at home, your normal regimen is likely to be disrupted due to multiple reasons following surgery.  Combination of anesthesia, postoperative narcotics, change in appetite and fluid intake all can affect your bowels.  In order to avoid complications following surgery, here are some recommendations in order to help you during your recovery period.  Colace (docusate) - Pick up an over-the-counter form of Colace or another stool softener and take twice a day as long as you are requiring postoperative pain medications.  Take with a full glass of water daily.  If you experience loose stools or diarrhea, hold the colace until you stool forms back up.  If your symptoms do not get better within 1 week or if they get worse, check with your doctor.  Dulcolax (bisacodyl) - Pick up over-the-counter and take as directed by the product packaging as needed to assist with the movement of your bowels.  Take with a full glass of water.  Use this product as needed if not relieved by Colace only.   MiraLax (polyethylene glycol) - Pick up over-the-counter to have on hand.  MiraLax is a solution that will increase the amount of water  in your bowels to assist with bowel movements.  Take as directed and can mix with a glass of water, juice, soda, coffee, or tea.  Take if you go more than two days without a movement. Do not use MiraLax more than once per day. Call your doctor if you are still constipated or irregular after using this medication for 7 days in a row.  If you continue to have problems with postoperative constipation, please contact the office for further assistance and recommendations.  If you experience "the worst abdominal pain ever" or develop nausea or vomiting, please contact the office immediatly for further recommendations for treatment..      Do not put a pillow under the knee. Place it under the heel.    Complete by:  As directed      Do not sit on low chairs, stoools or toilet seats, as it may be difficult to get up from low surfaces    Complete by:  As directed      Driving restrictions    Complete by:  As directed   No driving until released by the physician.     Increase activity slowly as tolerated    Complete by:  As directed      Lifting restrictions    Complete by:  As directed   No lifting until released by the physician.     Patient may shower    Complete by:  As directed   You may shower without a dressing once there is no drainage.  Do not wash over the wound.  If drainage remains, do not shower until drainage stops.     TED hose    Complete by:  As directed   Use stockings (TED hose) for 3 weeks on both leg(s).  You may remove them at night for sleeping.     Weight bearing as tolerated    Complete by:  As directed   Laterality:  left  Extremity:  Lower           Follow-up Information    Follow up with Louana Fontenot, Cloyde Reams, MD. Schedule an appointment as soon as possible for a visit in 2 weeks.   Specialty:  Orthopedic Surgery   Why:  For wound re-check   Contact information:   [address] [place] [address] [phone]        Signed: Garnet Koyanagi 06/12/2015, 8:14 AM

## 2015-06-01 NOTE — H&P (View-Only) (Signed)
TOTAL KNEE ADMISSION H&P  Patient is being admitted for left total knee arthroplasty.  Subjective:  Chief Complaint:left knee pain.  HPI: Claudia [name], 68 y.o. female, has a history of pain and functional disability in the left knee due to subchondral insufficiency fracture and arthritis and has failed non-surgical conservative treatments for greater than 12 weeks to includeuse of assistive devices, weight reduction as appropriate, activity modification and medial unloader brace.  Onset of symptoms was gradual, starting 1 years ago with gradually worsening course since that time. The patient noted no past surgery on the left knee(s).  Patient currently rates pain in the left knee(s) at 10 out of 10 with activity. Patient has night pain, worsening of pain with activity and weight bearing and pain that interferes with activities of daily living.  Patient has evidence of subchondral cysts, subchondral sclerosis and joint space narrowing by imaging studies. There is no active infection.  Patient Active Problem List   Diagnosis Date Noted  . Diastolic dysfunction without heart failure 05/14/2015  . Preop exam for internal medicine 05/10/2015  . Hamstring tendonitis of right thigh 01/27/2015  . Forgetfulness 11/02/2014  . Pes planus of both feet 08/08/2014  . Avulsion fracture of lateral malleolus 06/07/2014  . Sprain of ankle, unspecified site 02/01/2014  . Tibialis posterior tendinitis 12/03/2013  . Right ankle pain 09/14/2013  . Hyperglycemia 09/14/2013  . Leg pain 04/29/2012  . Knee pain 04/29/2012  . Well adult exam 10/23/2011  . 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 Weight gain 04/10/2011  . LIPOMA 10/17/2010  . HEMATURIA UNSPECIFIED 08/27/2010  . ABDOMINAL PAIN, GENERALIZED 07/16/2010  . WEIGHT LOSS 05/09/2010  . FREQUENCY, URINARY 05/09/2010  . ABDOMINAL PAIN, EPIGASTRIC 05/09/2010  . VERTIGO 02/06/2010  . NECK PAIN 03/28/2009  . GALLSTONES 12/07/2008  . HEMORRHOIDS, INTERNAL  11/23/2008  . ESOPHAGEAL STRICTURE 11/23/2008  . HIATAL HERNIA 11/23/2008  . IBS 11/23/2008  . UPPER RESPIRATORY INFECTION (URI) 11/01/2008  . Chest pain, unspecified 11/01/2008  . B12 deficiency 08/16/2008  . FATIGUE 08/16/2008  . Anxiety state 11/25/2007  . Depression 11/25/2007  . OSTEOARTHRITIS 11/25/2007  . CRAMPS,LEG 11/25/2007  . BREAST CANCER, HX OF 11/25/2007  . RESTLESS LEG SYNDROME 07/23/2007  . Essential hypertension 07/23/2007  . Myalgia and myositis 07/23/2007  . NEURALGIA 07/23/2007  . INSOMNIA 07/23/2007  . SYMPTOM, HEADACHE 07/23/2007   Past Medical History  Diagnosis Date  . Osteoarthritis   . Neuralgia of chest     post op  . Internal hemorrhoids without mention of complication   . Irritable bowel syndrome   . Stricture and stenosis of esophagus   . Hiatal hernia   . Chest pain, unspecified   . Acute upper respiratory infections of unspecified site   . Other B-complex deficiencies   . Other malaise and fatigue   . Osteoarthrosis, unspecified whether generalized or localized, unspecified site   . Cramp of limb   . Depressive disorder, not elsewhere classified   . Personal history of malignant neoplasm of breast   . Anxiety state, unspecified   . Neuralgia, neuritis, and radiculitis, unspecified   . Headache(784.0)   . Restless legs syndrome (RLS)   . Insomnia, unspecified   . Myalgia and myositis, unspecified   . Unspecified essential hypertension   . History of cold sores     Past Surgical History  Procedure Laterality Date  . Abdominal hysterectomy    . Tonsillectomy    . Mastectomy      bilateral total  .  Cholecystectomy       (Not in a hospital admission) Allergies  Allergen Reactions  . Bupropion Hcl     REACTION: CP  . Lovastatin     REACTION: myalgial  . Norco [Hydrocodone-Acetaminophen]     intolerant  . Olanzapine-Fluoxetine Hcl     REACTION: Leg swelling  . Sodium Lauryl Sulfate   . Sulfonamide Derivatives   . Tramadol      itching    History  Substance Use Topics  . Smoking status: Never Smoker   . Smokeless tobacco: Not on file  . Alcohol Use: No    Family History  Problem Relation Age of Onset  . Diabetes Mother   . Kidney disease Mother   . Cancer Mother     lymphoma  . Diabetes Sister   . Heart disease Sister   . Stroke Sister   . Diabetes Brother   . Kidney disease Brother   . Coronary artery disease Brother   . Cancer Paternal Grandmother     Breast  . Coronary artery disease Maternal Grandmother      Review of Systems  Constitutional: Negative.   HENT: Negative.   Eyes: Negative.   Respiratory: Negative.   Cardiovascular: Negative.   Gastrointestinal: Negative.   Genitourinary: Positive for frequency.  Musculoskeletal: Positive for myalgias and joint pain.  Skin: Negative.   Neurological: Negative.   Endo/Heme/Allergies: Negative.   Psychiatric/Behavioral: Negative.     Objective:  Physical Exam  Vitals reviewed. Constitutional: She is oriented to person, place, and time. She appears well-developed and well-nourished.  HENT:  Head: Normocephalic and atraumatic.  Eyes: Conjunctivae are normal. Pupils are equal, round, and reactive to light.  Neck: Normal range of motion. Neck supple.  Cardiovascular: Normal rate, regular rhythm, normal heart sounds and intact distal pulses.   Respiratory: Effort normal and breath sounds normal. No respiratory distress.  GI: Soft. Bowel sounds are normal. She exhibits no distension.  Genitourinary:  deferred  Musculoskeletal:       Left knee: She exhibits decreased range of motion and swelling. Tenderness found. Medial joint line tenderness noted.  Neurological: She is alert and oriented to person, place, and time. She has normal reflexes.  Skin: Skin is warm and dry.  Psychiatric: She has a normal mood and affect. Her behavior is normal. Judgment and thought content normal.    Vital signs in last 24  hours: @VSRANGES @  Labs:   Estimated body mass index is 29.15 kg/(m^2) as calculated from the following:   Height as of 01/27/15: 5' 7.5" (1.715 m).   Weight as of 05/10/15: 85.73 kg (189 lb).   Imaging Review Plain radiographs demonstrate severe degenerative joint disease of the left knee with a collapsed subchondral insufficiency fracture. The overall alignment isneutral. The bone quality appears to be adequate for age and reported activity level.  Assessment/Plan:  End stage arthritis, left knee   The patient history, physical examination, clinical judgment of the provider and imaging studies are consistent with end stage degenerative joint disease of the left knee(s) and total knee arthroplasty is deemed medically necessary. The treatment options including medical management, injection therapy arthroscopy and arthroplasty were discussed at length. The risks and benefits of total knee arthroplasty were presented and reviewed. The risks due to aseptic loosening, infection, stiffness, patella tracking problems, thromboembolic complications and other imponderables were discussed. The patient acknowledged the explanation, agreed to proceed with the plan and consent was signed. Patient is being admitted for inpatient treatment for surgery,  pain control, PT, OT, prophylactic antibiotics, VTE prophylaxis, progressive ambulation and ADL's and discharge planning. The patient is planning to be discharged home with home health services

## 2015-06-01 NOTE — Interval H&P Note (Signed)
History and Physical Interval Note:  06/01/2015 12:39 PM  Claudia [name]  has presented today for surgery, with the diagnosis of OA LEFT KNEE STRESS FRACTURE   The various methods of treatment have been discussed with the patient and family. After consideration of risks, benefits and other options for treatment, the patient has consented to  Procedure(s): LEFT TOTAL KNEE ARTHROPLASTY (Left) as a surgical intervention .  The patient's history has been reviewed, patient examined, no change in status, stable for surgery.  I have reviewed the patient's chart and labs.  Questions were answered to the patient's satisfaction.     Karsten Howry, Horald Pollen

## 2015-06-01 NOTE — Transfer of Care (Signed)
Immediate Anesthesia Transfer of Care Note  Patient: Claudia [name]  Procedure(s) Performed: Procedure(s): LEFT TOTAL KNEE ARTHROPLASTY (Left)  Patient Location: PACU  Anesthesia Type:Spinal  Level of Consciousness:  sedated, patient cooperative and responds to stimulation  Airway & Oxygen Therapy:Patient Spontanous Breathing and Patient connected to face mask oxgen  Post-op Assessment:  Report given to PACU RN and Post -op Vital signs reviewed and stable, spinal worn off, patient reports no pain.  Post vital signs:  Reviewed and stable  Last Vitals:  Filed Vitals:   06/01/15 0920  BP: 151/67  Pulse: 69  Temp: 36.6 C  Resp: 18    Complications: No apparent anesthesia complications

## 2015-06-01 NOTE — Progress Notes (Signed)
Utilization review completed.  

## 2015-06-01 NOTE — Anesthesia Postprocedure Evaluation (Signed)
  Anesthesia Post-op Note  Patient: Claudia [name]  Procedure(s) Performed: Procedure(s) (LRB): LEFT TOTAL KNEE ARTHROPLASTY (Left)  Patient Location: PACU  Anesthesia Type: Spinal  Level of Consciousness: awake and alert   Airway and Oxygen Therapy: Patient Spontanous Breathing  Post-op Pain: mild  Post-op Assessment: Post-op Vital signs reviewed, Patient's Cardiovascular Status Stable, Respiratory Function Stable, Patent Airway and No signs of Nausea or vomiting  Last Vitals:  Filed Vitals:   06/01/15 1645  BP: 123/54  Pulse: 79  Temp:   Resp: 17    Post-op Vital Signs: stable   Complications: No apparent anesthesia complications

## 2015-06-01 NOTE — Progress Notes (Signed)
Patient scratching less than earlier

## 2015-06-01 NOTE — Discharge Instructions (Signed)
° °Dr. Cassidi Modesitt °Total Joint Specialist °[facility] °[address] °[place], [place] [address] °[phone] ° °TOTAL KNEE REPLACEMENT POSTOPERATIVE DIRECTIONS ° ° ° °Knee Rehabilitation, Guidelines Following Surgery  °Results after knee surgery are often greatly improved when you follow the exercise, range of motion and muscle strengthening exercises prescribed by your doctor. Safety measures are also important to protect the knee from further injury. Any time any of these exercises cause you to have increased pain or swelling in your knee joint, decrease the amount until you are comfortable again and slowly increase them. If you have problems or questions, call your caregiver or physical therapist for advice.  ° °WEIGHT BEARING °Weight bearing as tolerated with assist device (walker, cane, etc) as directed, use it as long as suggested by your surgeon or therapist, typically at least 4-6 weeks. ° °HOME CARE INSTRUCTIONS  °Remove items at home which could result in a fall. This includes throw rugs or furniture in walking pathways.  °Continue medications as instructed at time of discharge. °You may have some home medications which will be placed on hold until you complete the course of blood thinner medication.  °You may start showering once you are discharged home but do not submerge the incision under water. Just pat the incision dry and apply a dry gauze dressing on daily. °Walk with walker as instructed.  °You may resume a sexual relationship in one month or when given the OK by your doctor.  °· Use walker as long as suggested by your caregivers. °· Avoid periods of inactivity such as sitting longer than an hour when not asleep. This helps prevent blood clots.  °You may put full weight on your legs and walk as much as is comfortable.  °You may return to work once you are cleared by your doctor.  °Do not drive a car for 6 weeks or until released by you surgeon.  °· Do not drive  while taking narcotics.  °Wear the elastic stockings for three weeks following surgery during the day but you may remove then at night. °Make sure you keep all of your appointments after your operation with all of your doctors and caregivers. You should call the office at the above phone number and make an appointment for approximately two weeks after the date of your surgery. °Do not remove your surgical dressing. The dressing is waterproof; you may take showers in 3 days, but do not take tub baths or submerge the dressing. °Please pick up a stool softener and laxative for home use as long as you are requiring pain medications. °· ICE to the affected knee every three hours for 30 minutes at a time and then as needed for pain and swelling.  Continue to use ice on the knee for pain and swelling from surgery. You may notice swelling that will progress down to the foot and ankle.  This is normal after surgery.  Elevate the leg when you are not up walking on it.   °It is important for you to complete the blood thinner medication as prescribed by your doctor. °· Continue to use the breathing machine which will help keep your temperature down.  It is common for your temperature to cycle up and down following surgery, especially at night when you are not up moving around and exerting yourself.  The breathing machine keeps your lungs expanded and your temperature down. ° °RANGE OF MOTION AND STRENGTHENING EXERCISES  °Rehabilitation of the knee is important following   a knee injury or an operation. After just a few days of immobilization, the muscles of the thigh which control the knee become weakened and shrink (atrophy). Knee exercises are designed to build up the tone and strength of the thigh muscles and to improve knee motion. Often times heat used for twenty to thirty minutes before working out will loosen up your tissues and help with improving the range of motion but do not use heat for the first two weeks following  surgery. These exercises can be done on a training (exercise) mat, on the floor, on a table or on a bed. Use what ever works the best and is most comfortable for you Knee exercises include:  °Leg Lifts - While your knee is still immobilized in a splint or cast, you can do straight leg raises. Lift the leg to 60 degrees, hold for 3 sec, and slowly lower the leg. Repeat 10-20 times 2-3 times daily. Perform this exercise against resistance later as your knee gets better.  °Quad and Hamstring Sets - Tighten up the muscle on the front of the thigh (Quad) and hold for 5-10 sec. Repeat this 10-20 times hourly. Hamstring sets are done by pushing the foot backward against an object and holding for 5-10 sec. Repeat as with quad sets.  °A rehabilitation program following serious knee injuries can speed recovery and prevent re-injury in the future due to weakened muscles. Contact your doctor or a physical therapist for more information on knee rehabilitation.  ° °SKILLED REHAB INSTRUCTIONS: °If the patient is transferred to a skilled rehab facility following release from the hospital, a list of the current medications will be sent to the facility for the patient to continue.  When discharged from the skilled rehab facility, please have the facility set up the patient's Home Health Physical Therapy prior to being released. Also, the skilled facility will be responsible for providing the patient with their medications at time of release from the facility to include their pain medication, the muscle relaxants, and their blood thinner medication. If the patient is still at the rehab facility at time of the two week follow up appointment, the skilled rehab facility will also need to assist the patient in arranging follow up appointment in our office and any transportation needs. ° °MAKE SURE YOU:  °Understand these instructions.  °Will watch your condition.  °Will get help right away if you are not doing well or get worse.   ° ° °Pick up stool softner and laxative for home use following surgery while on pain medications. °Do NOT remove your dressing. You may shower.  °Do not take tub baths or submerge incision under water. °May shower starting three days after surgery. °Please use a clean towel to pat the incision dry following showers. °Continue to use ice for pain and swelling after surgery. °Do not use any lotions or creams on the incision until instructed by your surgeon. ° °

## 2015-06-01 NOTE — Anesthesia Procedure Notes (Addendum)
Spinal Patient location during procedure: OR Start time: 06/01/2015 1:19 PM End time: 06/01/2015 1:26 PM Staffing Resident/CRNA: Carleene Cooper A Preanesthetic Checklist Completed: patient identified, site marked, surgical consent, pre-op evaluation, timeout performed, IV checked, risks and benefits discussed and monitors and equipment checked Spinal Block Patient position: sitting Prep: Betadine Patient monitoring: heart rate, continuous pulse ox and blood pressure Approach: midline Location: L2-3 Injection technique: single-shot Needle Needle type: Sprotte  Needle gauge: 24 G Needle length: 10 cm Assessment Sensory level: T4 Additional Notes Pt tolerated well. Placed in sitting position. One attempt by CRNA. + CSF. Spinal expiration kit date 2017-12.  Procedure Name: MAC Date/Time: 06/01/2015 1:13 PM Performed by: Carleene Cooper A Pre-anesthesia Checklist: Emergency Drugs available, Suction available, Patient being monitored, Timeout performed and Patient identified Patient Re-evaluated:Patient Re-evaluated prior to inductionOxygen Delivery Method: Simple face mask Dental Injury: Teeth and Oropharynx as per pre-operative assessment

## 2015-06-01 NOTE — Anesthesia Preprocedure Evaluation (Addendum)
Anesthesia Evaluation  Patient identified by MRN, date of birth, ID band Patient awake    Reviewed: Allergy & Precautions, NPO status , Patient's Chart, lab work & pertinent test results  Airway Mallampati: II  TM Distance: >3 FB Neck ROM: Full    Dental no notable dental hx.    Pulmonary neg pulmonary ROS, former smoker,  breath sounds clear to auscultation  Pulmonary exam normal       Cardiovascular hypertension, Pt. on medications negative cardio ROS Normal cardiovascular examRhythm:Regular Rate:Normal     Neuro/Psych Seizures -,  negative neurological ROS  negative psych ROS   GI/Hepatic negative GI ROS, Neg liver ROS,   Endo/Other  negative endocrine ROS  Renal/GU negative Renal ROS  negative genitourinary   Musculoskeletal negative musculoskeletal ROS (+)   Abdominal   Peds negative pediatric ROS (+)  Hematology negative hematology ROS (+)   Anesthesia Other Findings   Reproductive/Obstetrics negative OB ROS                           Anesthesia Physical Anesthesia Plan  ASA: II  Anesthesia Plan: Spinal   Post-op Pain Management:    Induction: Intravenous  Airway Management Planned: Simple Face Mask  Additional Equipment:   Intra-op Plan:   Post-operative Plan: Extubation in OR  Informed Consent: I have reviewed the patients History and Physical, chart, labs and discussed the procedure including the risks, benefits and alternatives for the proposed anesthesia with the patient or authorized representative who has indicated his/her understanding and acceptance.   Dental advisory given  Plan Discussed with: CRNA  Anesthesia Plan Comments:         Anesthesia Quick Evaluation

## 2015-06-01 NOTE — Progress Notes (Signed)
Benadryl 12.5mg  IVP given for itching all over- no rash nor whelps noted

## 2015-06-02 ENCOUNTER — Encounter (HOSPITAL_COMMUNITY): Payer: Self-pay | Admitting: Orthopedic Surgery

## 2015-06-02 LAB — BASIC METABOLIC PANEL
Anion gap: 4 — ABNORMAL LOW (ref 5–15)
BUN: 22 mg/dL — ABNORMAL HIGH (ref 6–20)
CALCIUM: 8 mg/dL — AB (ref 8.9–10.3)
CO2: 26 mmol/L (ref 22–32)
Chloride: 106 mmol/L (ref 101–111)
Creatinine, Ser: 1.12 mg/dL — ABNORMAL HIGH (ref 0.44–1.00)
GFR calc Af Amer: 56 mL/min — ABNORMAL LOW (ref >60)
GFR calc non Af Amer: 48 mL/min — ABNORMAL LOW (ref >60)
Glucose, Bld: 145 mg/dL — ABNORMAL HIGH (ref 65–99)
Potassium: 4.6 mmol/L (ref 3.5–5.1)
Sodium: 136 mmol/L (ref 135–145)

## 2015-06-02 LAB — CBC
HEMATOCRIT: 33.5 % — AB (ref 36.0–46.0)
Hemoglobin: 11 g/dL — ABNORMAL LOW (ref 12.0–15.0)
MCH: 30.6 pg (ref 26.0–34.0)
MCHC: 32.8 g/dL (ref 30.0–36.0)
MCV: 93.1 fL (ref 78.0–100.0)
Platelets: 250 10*3/uL (ref 150–400)
RBC: 3.6 MIL/uL — ABNORMAL LOW (ref 3.87–5.11)
RDW: 12.3 % (ref 11.5–15.5)
WBC: 10.1 10*3/uL (ref 4.0–10.5)

## 2015-06-02 LAB — GLUCOSE, CAPILLARY
Glucose-Capillary: 119 mg/dL — ABNORMAL HIGH (ref 65–99)
Glucose-Capillary: 122 mg/dL — ABNORMAL HIGH (ref 65–99)
Glucose-Capillary: 182 mg/dL — ABNORMAL HIGH (ref 65–99)
Glucose-Capillary: 150 mg/dL — ABNORMAL HIGH (ref 65–99)

## 2015-06-02 MED ORDER — METHOCARBAMOL 500 MG PO TABS: 500.0000 mg | ORAL_TABLET | Freq: Four times a day (QID) | ORAL | Status: DC | PRN

## 2015-06-02 MED ORDER — HYDROMORPHONE HCL 2 MG PO TABS: 2.0000 mg | ORAL_TABLET | ORAL | Status: DC | PRN

## 2015-06-02 MED ORDER — HYDROMORPHONE HCL 2 MG PO TABS
2.0000 mg | ORAL_TABLET | ORAL | Status: DC | PRN
  Administered 2015-06-02 – 2015-06-03 (×5): 4 mg via ORAL
  Filled 2015-06-02 (×4): qty 2
  Filled 2015-06-02: qty 1
  Filled 2015-06-02: qty 2
  Filled 2015-06-02: qty 1

## 2015-06-02 NOTE — Progress Notes (Signed)
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 Reports that her son-in-law will be her HHPT.  Follow Up Recommendations   (son ion law to bprovide PT)     Equipment Recommendations  None recommended by PT    Recommendations for Other Services       Precautions / Restrictions Precautions Precautions: Knee    Mobility  Bed Mobility Overal bed mobility: Modified Independent                Transfers Overall transfer level: Needs assistance Equipment used: Rolling walker (2 wheeled) Transfers: Sit to/from Stand Sit to Stand: Supervision Stand pivot transfers: Min guard       General transfer comment: for safety;  Ambulation/Gait Ambulation/Gait assistance: Supervision Ambulation Distance (Feet): 180 Feet Assistive device: Rolling walker (2 wheeled) Gait Pattern/deviations: Step-through pattern     General Gait Details: cues for safety and sequence. cues to work on Museum/gallery exhibitions officer Rankin (Stroke Patients Only)       Balance                                    Cognition Arousal/Alertness: Awake/alert Behavior During Therapy: WFL for tasks assessed/performed;Impulsive Overall Cognitive Status: Within Functional Limits for tasks assessed                      Exercises Total Joint Exercises Ankle Circles/Pumps: AROM;Both;Supine Quad Sets: AROM;Both;Supine Towel Squeeze: AROM;Supine Short Arc Quad: AROM;Left;Supine Heel Slides: AROM;Supine Hip ABduction/ADduction: AROM;Left;Supine Straight Leg Raises: AROM;Left;Supine Goniometric ROM: 5070 l knee flexion    General Comments        Pertinent Vitals/Pain Pain Score: 2  Pain  Location: L knee Pain Descriptors / Indicators: Aching;Burning Pain Intervention(s): Monitored during session;Ice applied;Premedicated before session    Home Living Family/patient expects to be discharged to:: Private residence Living Arrangements: Children                  Prior Function            PT Goals (current goals can now be found in the care plan section) Acute Rehab PT Goals Patient Stated Goal: get back to being independent PT Goal Formulation: With patient Time For Goal Achievement: 06/05/15 Potential to Achieve Goals: Good Progress towards PT goals: Progressing toward goals    Frequency  7X/week    PT Plan Current plan remains appropriate    Co-evaluation             End of Session   Activity Tolerance: Patient tolerated treatment well Patient left: in bed;with call bell/phone within reach     Time: 1437-1515 PT Time Calculation (min) (ACUTE ONLY): 38 min  Charges:  $Gait Training: 8-22 mins $Therapeutic Exercise: 8-22 mins $Self Care/Home Management: 02-Aug-2023                    G Codes:      Claretha Cooper 06/02/2015, 4:38 PM

## 2015-06-02 NOTE — Evaluation (Signed)
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 Plans  To DC to stay with daughter. Patient will benefit from PT to address problems listed in note below.    Follow Up Recommendations  (son in law to be her HHPT.)    Equipment Recommendations  None recommended by PT    Recommendations for Other Services       Precautions / Restrictions Precautions Precautions: Knee Restrictions Weight Bearing Restrictions: No      Mobility  Bed Mobility Overal bed mobility: Modified Independent             General bed mobility comments: oob  Transfers Overall transfer level: Needs assistance Equipment used: Rolling walker (2 wheeled) Transfers: Sit to/from Stand Sit to Stand: Min guard Stand pivot transfers: Min guard       General transfer comment: for safety;  Ambulation/Gait Ambulation/Gait assistance: Min guard Ambulation Distance (Feet): 100 Feet Assistive device: Rolling walker (2 wheeled) Gait Pattern/deviations: Step-to pattern;Antalgic     General Gait Details: cues for safety and sequence.  Stairs            Wheelchair Mobility    Modified Rankin (Stroke Patients Only)       Balance                                             Pertinent Vitals/Pain Pain Assessment: 0-10 Pain Score: 3  Pain Location: L knee Pain Descriptors / Indicators: Throbbing;Cramping Pain Intervention(s): Limited activity within patient's tolerance;Monitored during session;Premedicated before session;Ice applied    Home Living Family/patient expects to be discharged to:: Private residence Living Arrangements: Children Available Help at Discharge: Family;Available PRN/intermittently Type of Home: House Home Access: Stairs to  enter Entrance Stairs-Rails: Right;Left Entrance Stairs-Number of Steps: 7 Home Layout: One level;Full bath on main level Home Equipment: Walker - 2 wheels Additional Comments: will stay at daughter's house; son is a PT    Prior Function Level of Independence: Independent with assistive device(s)               Hand Dominance        Extremity/Trunk Assessment   Upper Extremity Assessment: Overall WFL for tasks assessed           Lower Extremity Assessment: RLE deficits/detail RLE Deficits / Details: able to perform a SLR    Cervical / Trunk Assessment: Normal  Communication   Communication: No difficulties  Cognition Arousal/Alertness: Awake/alert Behavior During Therapy: WFL for tasks assessed/performed;Impulsive Overall Cognitive Status: Within Functional Limits for tasks assessed                      General Comments      Exercises        Assessment/Plan    PT Assessment    PT Diagnosis Difficulty walking;Acute pain   PT Problem List    PT Treatment Interventions     PT Goals (Current goals can be found in the Care Plan section) Acute Rehab PT Goals Patient Stated Goal: get back to being independent PT Goal Formulation: With patient Time For Goal Achievement: 06/05/15 Potential to Achieve Goals: Good    Frequency  Barriers to discharge        Co-evaluation               End of Session   Activity Tolerance: Patient tolerated treatment well Patient left: in chair;with call bell/phone within reach Nurse Communication: Mobility status         Time: 1001-1035 PT Time Calculation (min) (ACUTE ONLY): 34 min   Charges:   PT Evaluation $Initial PT Evaluation Tier I: 1 Procedure PT Treatments $Gait Training: 8-22 mins   PT G Codes:        Claretha Cooper 06/02/2015, 12:53 PM Tresa Endo PT [phone]

## 2015-06-02 NOTE — Evaluation (Signed)
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  All education was completed.  No further OT is needed at this time.    Follow Up Recommendations  Home health OT (initial 24/7 then intermittent assistance)    Equipment Recommendations  3 in 1 bedside comode    Recommendations for Other Services       Precautions / Restrictions Precautions Precautions: Knee Restrictions Weight Bearing Restrictions: No      Mobility Bed Mobility               General bed mobility comments: oob  Transfers Overall transfer level: Needs assistance Equipment used: Rolling walker (2 wheeled) Transfers: Sit to/from Stand;Stand Pivot Transfers Sit to Stand: Min guard Stand pivot transfers: Min guard       General transfer comment: for safety; no cues needed    Balance                                            ADL Overall ADL's : Needs assistance/impaired     Grooming: Oral care;Standing;Supervision/safety       Lower Body Bathing: Minimal assistance;Sit to/from stand       Lower Body Dressing: Maximal assistance;Sit to/from stand   Toilet Transfer: Min guard;Ambulation (recliner)             General ADL Comments: pt can perform UB adls with set up. She will have daughter to assist as needed and thinks daughter has a Sports administrator also.  Educated on tub readiness/DME options.  Pt plans intermittent assistance     Vision     Perception     Praxis      Pertinent Vitals/Pain Pain Assessment: 0-10 Pain Score: 3  Pain Location: L knee Pain Descriptors / Indicators: Sore Pain Intervention(s): Limited activity within patient's tolerance;Monitored during session;Premedicated before session;Repositioned;Ice  applied     Hand Dominance     Extremity/Trunk Assessment Upper Extremity Assessment Upper Extremity Assessment: Overall WFL for tasks assessed           Communication Communication Communication: No difficulties   Cognition Arousal/Alertness: Awake/alert Behavior During Therapy: WFL for tasks assessed/performed Overall Cognitive Status:  (pt reports decreased memory:  wfls during OT eval)                     General Comments       Exercises       Shoulder Instructions      Home Living Family/patient expects to be discharged to:: Private residence Living Arrangements: Children Available Help at Discharge: Family;Available PRN/intermittently Type of Home: House Home Access: Stairs to enter [employer] of Steps: 7 Entrance Stairs-Rails: Right;Left Home Layout: One level;Full bath on main level     Bathroom Shower/Tub: Tub/shower unit Shower/tub characteristics: Engineer, building services: Standard     Home Equipment: Environmental consultant - 2 wheels   Additional Comments: will stay at daughter's house; son is a PT      Prior Functioning/Environment Level of Independence: Independent with assistive device(s)             OT Diagnosis: Generalized weakness   OT Problem List: Decreased strength;Decreased  activity tolerance;Decreased knowledge of use of DME or AE;Pain   OT Treatment/Interventions: Self-care/ADL training;DME and/or AE instruction;Patient/family education;Balance training    OT Goals(Current goals can be found in the care plan section) Acute Rehab OT Goals Patient Stated Goal: get back to being independent ADL Goals Pt Will Transfer to Toilet: with supervision;bedside commode;stand pivot transfer;ambulating Pt Will Perform Toileting - Clothing Manipulation and hygiene: with supervision;sit to/from stand  OT Frequency: Min 2X/week   Barriers to D/C:            Co-evaluation              End of Session    Activity Tolerance:  Patient tolerated treatment well Patient left: in chair;with call bell/phone within reach   Time: 1047-1107 OT Time Calculation (min): 20 min Charges:  OT General Charges $OT Visit: 1 Procedure OT Evaluation $Initial OT Evaluation Tier I: 1 Procedure G-Codes:    Toure Edmonds 26-Jun-2015, 11:39 AM Marica Otter, OTR/L [phone] 06-26-15

## 2015-06-02 NOTE — Progress Notes (Signed)
 Subjective: 1 Day Post-Op Procedure(s) (LRB): LEFT TOTAL KNEE ARTHROPLASTY (Left) Patient reports pain as mild and moderate.   Patient seen in rounds for Dr. Swinteck. Patient is well, but has had some minor complaints of pain in the knee, requiring pain medications We will start therapy today. Probably home tomorrow with family. Plan is to go Home after hospital stay.  Objective: Vital signs in last 24 hours: Temp:  [97.6 F (36.4 C)-98.7 F (37.1 C)] 97.8 F (36.6 C) (07/15 0629) Pulse Rate:  [63-87] 63 (07/15 0629) Resp:  [13-23] 16 (07/15 0629) BP: (112-151)/(53-80) 122/55 mmHg (07/15 0629) SpO2:  [96 %-100 %] 97 % (07/15 0629) Weight:  [83.915 kg (185 lb)] 83.915 kg (185 lb) (07/14 0922)  Intake/Output from previous day:  Intake/Output Summary (Last 24 hours) at 06/02/15 0855 Last data filed at 06/02/15 0700  Gross per 24 hour  Intake   4635 ml  Output   2620 ml  Net   2015 ml    Intake/Output this shift: UOP 700 since around [place]  Labs:  Recent Labs  06/02/15 0409  HGB 11.0*    Recent Labs  06/02/15 0409  WBC 10.1  RBC 3.60*  HCT 33.5*  PLT 250    Recent Labs  06/02/15 0409  NA 136  K 4.6  CL 106  CO2 26  BUN 22*  CREATININE 1.12*  GLUCOSE 145*  CALCIUM 8.0*   No results for input(s): LABPT, INR in the last 72 hours.  EXAM General - Patient is Alert and Appropriate Extremity - Neurovascular intact Sensation intact distally Dorsiflexion/Plantar flexion intact Dressing - dressing C/D/I, Aquacel Dressing in place. Motor Function - intact, moving foot and toes well on exam.  Hemovac pulled without difficulty.  Past Medical History  Diagnosis Date  . Osteoarthritis   . Neuralgia of chest     post op  . Internal hemorrhoids without mention of complication   . Irritable bowel syndrome   . Stricture and stenosis of esophagus   . Hiatal hernia   . Chest pain, unspecified   . Acute upper respiratory infections of unspecified site   .  Other B-complex deficiencies   . Other malaise and fatigue   . Osteoarthrosis, unspecified whether generalized or localized, unspecified site   . Cramp of limb   . Depressive disorder, not elsewhere classified   . Personal history of malignant neoplasm of breast   . Anxiety state, unspecified   . Neuralgia, neuritis, and radiculitis, unspecified   . Headache(784.0)   . Restless legs syndrome (RLS)   . Insomnia, unspecified   . Myalgia and myositis, unspecified   . Unspecified essential hypertension   . History of cold sores   . Cancer     breast  . Pneumonia     hx of  . Seizures     "mini seizures" 15 yrs. ago. Pt. 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   Weight as of this encounter: 83.915 kg (185 lb). Advance diet Up with therapy Plan for discharge tomorrow Discharge home with home health  DVT Prophylaxis - Aspirin 325 mg BID Weight-Bearing as tolerated to left leg D/C O2 and Pulse OX and try on Room   Air Plan home tomorrow. Dr. Swinteck is currently out of town. Please call me for questions.  Weekend coverage staff will see patient in the morning and discharge if met goals.  Drew , PA-C Orthopaedic Surgery 06/02/2015, 8:55 AM   

## 2015-06-03 LAB — GLUCOSE, CAPILLARY
Glucose-Capillary: 73 mg/dL (ref 65–99)
Glucose-Capillary: 98 mg/dL (ref 65–99)

## 2015-06-03 LAB — CBC
HCT: 35.1 % — ABNORMAL LOW (ref 36.0–46.0)
Hemoglobin: 11.2 g/dL — ABNORMAL LOW (ref 12.0–15.0)
MCH: 30.2 pg (ref 26.0–34.0)
MCHC: 31.9 g/dL (ref 30.0–36.0)
MCV: 94.6 fL (ref 78.0–100.0)
PLATELETS: 316 10*3/uL (ref 150–400)
RBC: 3.71 MIL/uL — AB (ref 3.87–5.11)
RDW: 12.8 % (ref 11.5–15.5)
WBC: 12.6 10*3/uL — ABNORMAL HIGH (ref 4.0–10.5)

## 2015-06-03 NOTE — Progress Notes (Signed)
Pt is refusing 3-in-1 as she is stated she is getting one from a friend.  Pt understands this is a recommended piece of equipment for her rehab, pt expressed understanding and she already has access to one.  Nolon Nations, RN

## 2015-06-03 NOTE — Care Management Note (Signed)
Case Management Note  Patient Details  Name: Claudia [name] MRN: [id number] Date of Birth: 1946/07/27  Subjective/Objective:                  LEFT TOTAL KNEE ARTHROPLASTY (Left)   Action/Plan:  Discharge planning  Expected Discharge Date:                  Expected Discharge Plan:  Home/Self Care  In-House Referral:     Discharge planning Services  CM Consult  Post Acute Care Choice:    Choice offered to:     DME Arranged:    DME Agency:     HH Arranged:    [place] Agency:     Status of Service:  Completed, signed off  Medicare Important Message Given:    Date Medicare IM Given:    Medicare IM give by:    Date Additional Medicare IM Given:    Additional Medicare Important Message give by:     If discussed at [place] of Stay Meetings, dates discussed:    Additional Comments: CM spoke with patient. Her family member will be providing in-home PT. She has a rolling walker and 3N1 at home.   Apolonio Schneiders, RN 06/03/2015, 10:52 AM

## 2015-06-03 NOTE — Progress Notes (Signed)
Subjective: 2 Days Post-Op Procedure(s) (LRB): LEFT TOTAL KNEE ARTHROPLASTY (Left)  Patient reports pain as mild to moderate.  Has been up with therapy yesterday w/o complaints.  Denies fever, N/V.  Able to eat and admits to flatulence.  Objective:   VITALS:  Temp:  [98.1 F (36.7 C)-98.4 F (36.9 C)] 98.1 F (36.7 C) (07/16 0554) Pulse Rate:  [69-83] 69 (07/16 0554) Resp:  [14-18] 16 (07/16 0554) BP: (126-155)/(47-65) 143/64 mmHg (07/16 0554) SpO2:  [96 %-100 %] 99 % (07/16 0554)  Neurologically intact ABD soft Neurovascular intact Sensation intact distally Intact pulses distally Dorsiflexion/Plantar flexion intact No cellulitis present Dressing is C/D/I   LABS  Recent Labs  06/02/15 0409 06/03/15 0523  HGB 11.0* 11.2*  WBC 10.1 12.6*  PLT 250 316    Recent Labs  06/02/15 0409  NA 136  K 4.6  CL 106  CO2 26  BUN 22*  CREATININE 1.12*  GLUCOSE 145*   No results for input(s): LABPT, INR in the last 72 hours.   Assessment/Plan: 2 Days Post-Op Procedure(s) (LRB): LEFT TOTAL KNEE ARTHROPLASTY (Left)  Up with therapy prior to d/c. Planned D/C home today. F/u in 2 weeks with Dr. Lyla Glassing in the office.   Mechele Claude, PA-C, [employer] Office:  [phone]

## 2015-06-03 NOTE — Progress Notes (Signed)
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    PT Comments    Progressing well  Follow Up Recommendations  Other (comment) (son inlaw to provide PT)     Equipment Recommendations  3in1 (PT)    Recommendations for Other Services       Precautions / Restrictions Precautions Precautions: Knee Restrictions Weight Bearing Restrictions: No    Mobility  Bed Mobility Overal bed mobility: Modified Independent                Transfers Overall transfer level: Needs assistance Equipment used: Rolling walker (2 wheeled) Transfers: Sit to/from Stand Sit to Stand: Supervision         General transfer comment: cues for hand placement  Ambulation/Gait Ambulation/Gait assistance: Supervision Ambulation Distance (Feet): 40 Feet Assistive device: Rolling walker (2 wheeled) Gait Pattern/deviations: Step-to pattern;Antalgic     General Gait Details: cues for safety and sequence. cues to work on increasing weight   Stairs Stairs: Yes Stairs assistance: Min assist Stair Management: One rail Right;One rail Left;Step to pattern;Sideways Number of Stairs: 3 General stair comments: cues for technique and sequence  Wheelchair Mobility    Modified Rankin (Stroke Patients Only)       Balance                                    Cognition Arousal/Alertness: Awake/alert Behavior During Therapy: WFL for tasks assessed/performed;Impulsive Overall Cognitive Status: Within Functional Limits for tasks assessed                      Exercises Total Joint Exercises Ankle Circles/Pumps: AROM;Both;Supine Quad Sets: AROM;Both;Supine;10 reps Heel Slides: AAROM;AROM;Left;10 reps;Supine    General Comments        Pertinent Vitals/Pain Pain Assessment: 0-10 Pain Score: 6  Pain  Location: L knee Pain Descriptors / Indicators: Constant Pain Intervention(s): Limited activity within patient's tolerance;Monitored during session;Ice applied;RN gave pain meds during session    Home Living                      Prior Function            PT Goals (current goals can now be found in the care plan section) Acute Rehab PT Goals Patient Stated Goal: get back to being independent PT Goal Formulation: With patient Time For Goal Achievement: 06/05/15 Potential to Achieve Goals: Good Progress towards PT goals: Progressing toward goals    Frequency  7X/week    PT Plan Current plan remains appropriate    Co-evaluation             End of Session   Activity Tolerance: Patient tolerated treatment well Patient left: in bed;with call bell/phone within reach     Time: 0932-1001 PT Time Calculation (min) (ACUTE ONLY): 29 min  Charges:  $Gait Training: 8-22 mins $Therapeutic Exercise: 8-22 mins                    G Codes:      Raela Bohl Jun 20, 2015, 10:03 AM

## 2015-06-03 NOTE — Plan of Care (Signed)
Problem: Consults Goal: Diagnosis- Total Joint Replacement Outcome: Completed/Met Date Met:  06/03/15 Primary Total KneeLEFT  Problem: Phase III Progression Outcomes Goal: Anticoagulant follow-up in place Outcome: Not Applicable Date Met:  65/46/50 ASA for VTE, no f/u needed.

## 2015-06-03 NOTE — Progress Notes (Signed)
Pt provided d/c instructions and prescriptions.  Pt IV removed without complications. Pt and belonging wheeled to family waiting down stairs. Pt has no additional questions.  Nolon Nations, RN

## 2015-06-05 ENCOUNTER — Telehealth: Payer: Self-pay

## 2015-06-05 NOTE — Telephone Encounter (Signed)
Pt was on TCM list and admitted for total knee arthroplasty. Follow up with surgeon post op.

## 2015-06-12 NOTE — Discharge Summary (Signed)
Physician Discharge Summary  Patient ID: Claudia [name] MRN: [id number] DOB/AGE: 05/19/1946 69 y.o.  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 Osteoarthritis   . Neuralgia of chest     post op  . Internal hemorrhoids without mention of complication   . Irritable bowel syndrome   . Stricture and stenosis of esophagus   . Hiatal hernia   . Chest pain, unspecified   . Acute upper respiratory infections of unspecified site   . Other B-complex deficiencies   . Other malaise and fatigue   . Osteoarthrosis, unspecified whether generalized or localized, unspecified site   . Cramp of limb   . Depressive disorder, not elsewhere classified   . Personal history of malignant neoplasm of breast   . Anxiety state, unspecified   . Neuralgia, neuritis, and radiculitis, unspecified   . Headache(784.0)   . Restless legs syndrome (RLS)   . Insomnia, unspecified   . Myalgia and myositis, unspecified   . Unspecified essential hypertension   . History of cold sores   . Cancer     breast  . Pneumonia     hx of  . Seizures     "mini seizures" 15 yrs. ago. Pt. states did have work up and was normal    Surgeries: Procedure(s): LEFT TOTAL KNEE ARTHROPLASTY on 06/01/2015  Consultants (if any):    Discharged Condition: Improved  Hospital Course: Claudia [name] is an 69 y.o. 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   She was given perioperative antibiotics:      Anti-infectives    Start   Dose/Rate Route Frequency  Ordered Stop   06/01/15 2000  ceFAZolin (ANCEF) IVPB 2 g/50 mL premix    2 g 100 mL/hr over 30 Minutes Intravenous Every 6 hours 06/01/15 1803 06/02/15 0151   06/01/15 0921  ceFAZolin (ANCEF) IVPB 2 g/50 mL premix    2 g 100 mL/hr over 30 Minutes Intravenous On call to O.R. 06/01/15 4696 06/01/15 1330    .  She was given sequential compression devices, early ambulation, and ASA for DVT prophylaxis.  She benefited maximally from the hospital stay and there were no complications.   Recent vital signs:  Filed Vitals:   06/03/15 1115  BP: 124/44  Pulse: 73  Temp:   Resp:     Recent laboratory studies:   Recent Labs    Lab Results  Component Value Date   HGB 11.2* 06/03/2015   HGB 11.0* 06/02/2015   HGB 14.6 05/26/2015      Recent Labs    Lab Results  Component Value Date   WBC 12.6* 06/03/2015   PLT 316 06/03/2015      Recent Labs    Lab Results  Component Value Date   INR 1.02 05/26/2015      Recent Labs    Lab Results  Component Value Date   NA 136 06/02/2015   K 4.6 06/02/2015   CL 106 06/02/2015   CO2 26 06/02/2015   BUN 22* 06/02/2015   CREATININE 1.12* 06/02/2015   GLUCOSE 145* 06/02/2015      Discharge Medications:    Medication List  STOP taking these medications       cholecalciferol 1000 UNITS tablet  Commonly known as: VITAMIN D     cyanocobalamin 1000 MCG/ML injection  Commonly known as: (VITAMIN B-12)     OCUVITE ADULT 50+ Caps     VITAMIN B 12 PO      TAKE these medications       acetaminophen 500 MG tablet  Commonly known as: TYLENOL  Take 500 mg by mouth every 6 (six) hours as needed.     amLODipine 2.5 MG tablet  Commonly known as: NORVASC  Take 1 tablet (2.5 mg total) by mouth daily.     aspirin EC 325 MG tablet  Take 1 tablet (325 mg total) by mouth 2 (two) times  daily after a meal.     docusate sodium 100 MG capsule  Commonly known as: COLACE  Take 1 capsule (100 mg total) by mouth 2 (two) times daily.     DULoxetine 60 MG capsule  Commonly known as: CYMBALTA  Take 1 capsule (60 mg total) by mouth daily. For depression     gabapentin 600 MG tablet  Commonly known as: NEURONTIN  TAKE 1 TABLET THREE TIMES DAILY     HYDROmorphone 2 MG tablet  Commonly known as: DILAUDID  Take 1-2 tablets (2-4 mg total) by mouth every 3 (three) hours as needed for moderate pain or severe pain.     INSULIN SYRINGE 1CC/29G 29G X 1/2" 1 ML Misc  Commonly known as: B-D INSULIN SYRINGE  As dirrected     loratadine 10 MG tablet  Commonly known as: CLARITIN  Take 1 tablet (10 mg total) by mouth daily. As needed for allergies     losartan 100 MG tablet  Commonly known as: COZAAR  Take 1 tablet (100 mg total) by mouth daily.     meloxicam 15 MG tablet  Commonly known as: MOBIC  Take 1 tablet (15 mg total) by mouth daily.     metFORMIN 500 MG tablet  Commonly known as: GLUCOPHAGE  Take 1 tablet (500 mg total) by mouth daily with breakfast.     methocarbamol 500 MG tablet  Commonly known as: ROBAXIN  Take 1 tablet (500 mg total) by mouth every 6 (six) hours as needed for muscle spasms.     omeprazole 40 MG capsule  Commonly known as: PRILOSEC  Take 1 capsule (40 mg total) by mouth 2 (two) times daily.     ondansetron 8 MG disintegrating tablet  Commonly known as: ZOFRAN ODT  Take 1 tablet (8 mg total) by mouth every 8 (eight) hours as needed for nausea or vomiting.     oxyCODONE-acetaminophen 5-325 MG per tablet  Commonly known as: ROXICET  Take 1-2 tablets by mouth every 4 (four) hours as needed for severe pain.     polyvinyl alcohol 1.4 % ophthalmic solution  Commonly known as: LIQUIFILM TEARS  Place 1-2 drops into both eyes daily as needed for dry eyes.      senna 8.6 MG Tabs tablet  Commonly known as: SENOKOT  Take 2 tablets (17.2 mg total) by mouth at bedtime.     valACYclovir 500 MG tablet  Commonly known as: VALTREX  Take 1 tablet (500 mg total) by mouth 2 (two) times daily.         Imaging Results    Diagnostic Studies: Dg Knee Left Port  06/01/2015 CLINICAL DATA: Postop left knee surgery EXAM: PORTABLE LEFT KNEE - 1-2 VIEW COMPARISON: Left knee films  of 01/27/2015 and MR left knee of 02/09/2015 FINDINGS: The femoral and tibial components of the left total knee replacement are in good position and alignment on the images obtained. No complicating features are seen. There is some air in the soft tissues and in the joint space postoperatively with a surgical drain present. IMPRESSION: Left total knee components in good position and alignment. No complicating features. Electronically Signed By: Dwyane Dee M.D. On: 06/01/2015 16:58     Disposition: 01-Home or Self Care  Discharge Instructions    Call MD / Call 911  Complete by: As directed   If you experience chest pain or shortness of breath, CALL 911 and be transported to the hospital emergency room. If you develope a fever above 101 F, pus (white drainage) or increased drainage or redness at the wound, or calf pain, call your surgeon's office.     Change dressing  Complete by: As directed   Change dressing daily with sterile 4 x 4 inch gauze dressing and apply TED hose. Do not submerge the incision under water.     Constipation Prevention  Complete by: As directed   Drink plenty of fluids. Prune juice may be helpful. You may use a stool softener, such as Colace (over the counter) 100 mg twice a day. Use MiraLax (over the counter) for constipation as needed.     Diet - low sodium heart healthy  Complete by: As directed      Discharge instructions  Complete by: As directed   Pick up stool softner and laxative for  home use following surgery while on pain medications. Do not submerge incision under water. May shower starting three days after surgery. Please use a clean towel to pat the dressing dry following showers. Continue to use ice for pain and swelling after surgery.  Take a full dose 325 mg Aspirin twice a day for one month.  Postoperative Constipation Protocol  Constipation - defined medically as fewer than three stools per week and severe constipation as less than one stool per week.  One of the most common issues patients have following surgery is constipation. Even if you have a regular bowel pattern at home, your normal regimen is likely to be disrupted due to multiple reasons following surgery. Combination of anesthesia, postoperative narcotics, change in appetite and fluid intake all can affect your bowels. In order to avoid complications following surgery, here are some recommendations in order to help you during your recovery period.  Colace (docusate) - Pick up an over-the-counter form of Colace or another stool softener and take twice a day as long as you are requiring postoperative pain medications. Take with a full glass of water daily. If you experience loose stools or diarrhea, hold the colace until you stool forms back up. If your symptoms do not get better within 1 week or if they get worse, check with your doctor.  Dulcolax (bisacodyl) - Pick up over-the-counter and take as directed by the product packaging as needed to assist with the movement of your bowels. Take with a full glass of water. Use this product as needed if not relieved by Colace only.   MiraLax (polyethylene glycol) - Pick up over-the-counter to have on hand. MiraLax is a solution that will increase the amount of water in your bowels to assist with bowel movements. Take as directed and can mix with a glass of water, juice, soda, coffee, or tea. Take if you go more than two days without a movement. Do not  use MiraLax more than once per day. Call your doctor if you are still constipated or irregular after using this medication for 7 days in a row.  If you continue to have problems with postoperative constipation, please contact the office for further assistance and recommendations. If you experience "the worst abdominal pain ever" or develop nausea or vomiting, please contact the office immediatly for further recommendations for treatment..     Do not put a pillow under the knee. Place it under the heel.  Complete by: As directed      Do not sit on low chairs, stoools or toilet seats, as it may be difficult to get up from low surfaces   Complete by: As directed      Driving restrictions  Complete by: As directed   No driving until released by the physician.     Increase activity slowly as tolerated  Complete by: As directed      Lifting restrictions  Complete by: As directed   No lifting until released by the physician.     Patient may shower  Complete by: As directed   You may shower without a dressing once there is no drainage. Do not wash over the wound. If drainage remains, do not shower until drainage stops.     TED hose  Complete by: As directed   Use stockings (TED hose) for 3 weeks on both leg(s). You may remove them at night for sleeping.     Weight bearing as tolerated  Complete by: As directed   Laterality: left  Extremity: Lower           Follow-up Information    Follow up with Magdelyn Roebuck, Cloyde Reams, MD. Schedule an appointment as soon as possible for a visit in 2 weeks.   Specialty: Orthopedic Surgery   Why: For wound re-check   Contact information:   [address] [place] [address] [phone]        Signed: Garnet Koyanagi 06/12/2015, 8:14 AM

## 2015-07-04 ENCOUNTER — Encounter: Payer: Self-pay | Admitting: Gastroenterology

## 2015-07-12 DIAGNOSIS — Z471 Aftercare following joint replacement surgery: Secondary | ICD-10-CM | POA: Diagnosis not present

## 2015-07-12 DIAGNOSIS — Z96652 Presence of left artificial knee joint: Secondary | ICD-10-CM | POA: Diagnosis not present

## 2015-07-16 ENCOUNTER — Other Ambulatory Visit: Payer: Self-pay | Admitting: Internal Medicine

## 2015-08-22 ENCOUNTER — Telehealth: Payer: Self-pay | Admitting: Internal Medicine

## 2015-08-31 LAB — FECAL OCCULT BLOOD, GUAIAC: FECAL OCCULT BLD: NEGATIVE

## 2015-09-12 ENCOUNTER — Ambulatory Visit (INDEPENDENT_AMBULATORY_CARE_PROVIDER_SITE_OTHER): Payer: Commercial Managed Care - HMO | Admitting: Internal Medicine

## 2015-09-12 ENCOUNTER — Encounter: Payer: Self-pay | Admitting: Internal Medicine

## 2015-09-12 VITALS — BP 139/84 | HR 71 | Temp 98.0°F | Wt 184.0 lb

## 2015-09-12 DIAGNOSIS — J069 Acute upper respiratory infection, unspecified: Secondary | ICD-10-CM

## 2015-09-12 DIAGNOSIS — F32A Depression, unspecified: Secondary | ICD-10-CM

## 2015-09-12 DIAGNOSIS — F329 Major depressive disorder, single episode, unspecified: Secondary | ICD-10-CM | POA: Diagnosis not present

## 2015-09-12 DIAGNOSIS — Z0000 Encounter for general adult medical examination without abnormal findings: Secondary | ICD-10-CM | POA: Diagnosis not present

## 2015-09-12 DIAGNOSIS — I10 Essential (primary) hypertension: Secondary | ICD-10-CM

## 2015-09-12 DIAGNOSIS — E538 Deficiency of other specified B group vitamins: Secondary | ICD-10-CM

## 2015-09-12 DIAGNOSIS — E119 Type 2 diabetes mellitus without complications: Secondary | ICD-10-CM | POA: Diagnosis not present

## 2015-09-12 MED ORDER — MELOXICAM 15 MG PO TABS: 15.0000 mg | ORAL_TABLET | Freq: Every day | ORAL | Status: DC

## 2015-09-12 MED ORDER — DULOXETINE HCL 60 MG PO CPEP: 60.0000 mg | ORAL_CAPSULE | Freq: Every day | ORAL | Status: DC

## 2015-09-12 MED ORDER — METFORMIN HCL 500 MG PO TABS: 500.0000 mg | ORAL_TABLET | Freq: Every day | ORAL | Status: DC

## 2015-09-12 MED ORDER — OMEPRAZOLE 40 MG PO CPDR: 40.0000 mg | DELAYED_RELEASE_CAPSULE | Freq: Two times a day (BID) | ORAL | Status: DC

## 2015-09-12 MED ORDER — VALACYCLOVIR HCL 500 MG PO TABS
500.0000 mg | ORAL_TABLET | Freq: Two times a day (BID) | ORAL | Status: AC
Start: 2015-09-12 — End: 2016-09-11

## 2015-09-12 MED ORDER — CEFUROXIME AXETIL 500 MG PO TABS: 500.0000 mg | ORAL_TABLET | Freq: Two times a day (BID) | ORAL | Status: DC

## 2015-09-12 MED ORDER — LOSARTAN POTASSIUM 100 MG PO TABS: 100.0000 mg | ORAL_TABLET | Freq: Every day | ORAL | Status: DC

## 2015-09-12 NOTE — Assessment & Plan Note (Signed)
Chronic  On Cymbalta 

## 2015-09-12 NOTE — Assessment & Plan Note (Signed)
On B12 

## 2015-09-12 NOTE — Assessment & Plan Note (Addendum)
Re-started Losartan Norvasc

## 2015-09-12 NOTE — Patient Instructions (Signed)
Preventive Care for Adults, Female A healthy lifestyle and preventive care can promote health and wellness. Preventive health guidelines for women include the following key practices.  A routine yearly physical is a good way to check with your health care provider about your health and preventive screening. It is a chance to share any concerns and updates on your health and to receive a thorough exam.  Visit your dentist for a routine exam and preventive care every 6 months. Brush your teeth twice a day and floss once a day. Good oral hygiene prevents tooth decay and gum disease.  The frequency of eye exams is based on your age, health, family medical history, use of contact lenses, and other factors. Follow your health care provider's recommendations for frequency of eye exams.  Eat a healthy diet. Foods like vegetables, fruits, whole grains, low-fat dairy products, and lean protein foods contain the nutrients you need without too many calories. Decrease your intake of foods high in solid fats, added sugars, and salt. Eat the right amount of calories for you.Get information about a proper diet from your health care provider, if necessary.  Regular physical exercise is one of the most important things you can do for your health. Most adults should get at least 150 minutes of moderate-intensity exercise (any activity that increases your heart rate and causes you to sweat) each week. In addition, most adults need muscle-strengthening exercises on 2 or more days a week.  Maintain a healthy weight. The body mass index (BMI) is a screening tool to identify possible weight problems. It provides an estimate of body fat based on height and weight. Your health care provider can find your BMI and can help you achieve or maintain a healthy weight.For adults 20 years and older:  A BMI below 18.5 is considered underweight.  A BMI of 18.5 to 24.9 is normal.  A BMI of 25 to 29.9 is considered overweight.  A  BMI of 30 and above is considered obese.  Maintain normal blood lipids and cholesterol levels by exercising and minimizing your intake of saturated fat. Eat a balanced diet with plenty of fruit and vegetables. Blood tests for lipids and cholesterol should begin at age 45 and be repeated every 5 years. If your lipid or cholesterol levels are high, you are over 50, or you are at high risk for heart disease, you may need your cholesterol levels checked more frequently.Ongoing high lipid and cholesterol levels should be treated with medicines if diet and exercise are not working.  If you smoke, find out from your health care provider how to quit. If you do not use tobacco, do not start.  Lung cancer screening is recommended for adults aged 45-80 years who are at high risk for developing lung cancer because of a history of smoking. A yearly low-dose CT scan of the lungs is recommended for people who have at least a 30-pack-year history of smoking and are a current smoker or have quit within the past 15 years. A pack year of smoking is smoking an average of 1 pack of cigarettes a day for 1 year (for example: 1 pack a day for 30 years or 2 packs a day for 15 years). Yearly screening should continue until the smoker has stopped smoking for at least 15 years. Yearly screening should be stopped for people who develop a health problem that would prevent them from having lung cancer treatment.  If you are pregnant, do not drink alcohol. If you are  breastfeeding, be very cautious about drinking alcohol. If you are not pregnant and choose to drink alcohol, do not have more than 1 drink per day. One drink is considered to be 12 ounces (355 mL) of beer, 5 ounces (148 mL) of wine, or 1.5 ounces (44 mL) of liquor.  Avoid use of street drugs. Do not share needles with anyone. Ask for help if you need support or instructions about stopping the use of drugs.  High blood pressure causes heart disease and increases the risk  of stroke. Your blood pressure should be checked at least every 1 to 2 years. Ongoing high blood pressure should be treated with medicines if weight loss and exercise do not work.  If you are 55-79 years old, ask your health care provider if you should take aspirin to prevent strokes.  Diabetes screening is done by taking a blood sample to check your blood glucose level after you have not eaten for a certain period of time (fasting). If you are not overweight and you do not have risk factors for diabetes, you should be screened once every 3 years starting at age 45. If you are overweight or obese and you are 40-70 years of age, you should be screened for diabetes every year as part of your cardiovascular risk assessment.  Breast cancer screening is essential preventive care for women. You should practice "breast self-awareness." This means understanding the normal appearance and feel of your breasts and may include breast self-examination. Any changes detected, no matter how small, should be reported to a health care provider. Women in their 20s and 30s should have a clinical breast exam (CBE) by a health care provider as part of a regular health exam every 1 to 3 years. After age 40, women should have a CBE every year. Starting at age 40, women should consider having a mammogram (breast X-ray test) every year. Women who have a family history of breast cancer should talk to their health care provider about genetic screening. Women at a high risk of breast cancer should talk to their health care providers about having an MRI and a mammogram every year.  Breast cancer gene (BRCA)-related cancer risk assessment is recommended for women who have family members with BRCA-related cancers. BRCA-related cancers include breast, ovarian, tubal, and peritoneal cancers. Having family members with these cancers may be associated with an increased risk for harmful changes (mutations) in the breast cancer genes BRCA1 and  BRCA2. Results of the assessment will determine the need for genetic counseling and BRCA1 and BRCA2 testing.  Your health care provider may recommend that you be screened regularly for cancer of the pelvic organs (ovaries, uterus, and vagina). This screening involves a pelvic examination, including checking for microscopic changes to the surface of your cervix (Pap test). You may be encouraged to have this screening done every 3 years, beginning at age 21.  For women ages 30-65, health care providers may recommend pelvic exams and Pap testing every 3 years, or they may recommend the Pap and pelvic exam, combined with testing for human papilloma virus (HPV), every 5 years. Some types of HPV increase your risk of cervical cancer. Testing for HPV may also be done on women of any age with unclear Pap test results.  Other health care providers may not recommend any screening for nonpregnant women who are considered low risk for pelvic cancer and who do not have symptoms. Ask your health care provider if a screening pelvic exam is right for   you.  If you have had past treatment for cervical cancer or a condition that could lead to cancer, you need Pap tests and screening for cancer for at least 20 years after your treatment. If Pap tests have been discontinued, your risk factors (such as having a new sexual partner) need to be reassessed to determine if screening should resume. Some women have medical problems that increase the chance of getting cervical cancer. In these cases, your health care provider may recommend more frequent screening and Pap tests.  Colorectal cancer can be detected and often prevented. Most routine colorectal cancer screening begins at the age of 50 years and continues through age 75 years. However, your health care provider may recommend screening at an earlier age if you have risk factors for colon cancer. On a yearly basis, your health care provider may provide home test kits to check  for hidden blood in the stool. Use of a small camera at the end of a tube, to directly examine the colon (sigmoidoscopy or colonoscopy), can detect the earliest forms of colorectal cancer. Talk to your health care provider about this at age 50, when routine screening begins. Direct exam of the colon should be repeated every 5-10 years through age 75 years, unless early forms of precancerous polyps or small growths are found.  People who are at an increased risk for hepatitis B should be screened for this virus. You are considered at high risk for hepatitis B if:  You were born in a country where hepatitis B occurs often. Talk with your health care provider about which countries are considered high risk.  Your parents were born in a high-risk country and you have not received a shot to protect against hepatitis B (hepatitis B vaccine).  You have HIV or AIDS.  You use needles to inject street drugs.  You live with, or have sex with, someone who has hepatitis B.  You get hemodialysis treatment.  You take certain medicines for conditions like cancer, organ transplantation, and autoimmune conditions.  Hepatitis C blood testing is recommended for all people born from 1945 through 1965 and any individual with known risks for hepatitis C.  Practice safe sex. Use condoms and avoid high-risk sexual practices to reduce the spread of sexually transmitted infections (STIs). STIs include gonorrhea, chlamydia, syphilis, trichomonas, herpes, HPV, and human immunodeficiency virus (HIV). Herpes, HIV, and HPV are viral illnesses that have no cure. They can result in disability, cancer, and death.  You should be screened for sexually transmitted illnesses (STIs) including gonorrhea and chlamydia if:  You are sexually active and are younger than 24 years.  You are older than 24 years and your health care provider tells you that you are at risk for this type of infection.  Your sexual activity has changed  since you were last screened and you are at an increased risk for chlamydia or gonorrhea. Ask your health care provider if you are at risk.  If you are at risk of being infected with HIV, it is recommended that you take a prescription medicine daily to prevent HIV infection. This is called preexposure prophylaxis (PrEP). You are considered at risk if:  You are sexually active and do not regularly use condoms or know the HIV status of your partner(s).  You take drugs by injection.  You are sexually active with a partner who has HIV.  Talk with your health care provider about whether you are at high risk of being infected with HIV. If   you choose to begin PrEP, you should first be tested for HIV. You should then be tested every 3 months for as long as you are taking PrEP.  Osteoporosis is a disease in which the bones lose minerals and strength with aging. This can result in serious bone fractures or breaks. The risk of osteoporosis can be identified using a bone density scan. Women ages 67 years and over and women at risk for fractures or osteoporosis should discuss screening with their health care providers. Ask your health care provider whether you should take a calcium supplement or vitamin D to reduce the rate of osteoporosis.  Menopause can be associated with physical symptoms and risks. Hormone replacement therapy is available to decrease symptoms and risks. You should talk to your health care provider about whether hormone replacement therapy is right for you.  Use sunscreen. Apply sunscreen liberally and repeatedly throughout the day. You should seek shade when your shadow is shorter than you. Protect yourself by wearing long sleeves, pants, a wide-brimmed hat, and sunglasses year round, whenever you are outdoors.  Once a month, do a whole body skin exam, using a mirror to look at the skin on your back. Tell your health care provider of new moles, moles that have irregular borders, moles that  are larger than a pencil eraser, or moles that have changed in shape or color.  Stay current with required vaccines (immunizations).  Influenza vaccine. All adults should be immunized every year.  Tetanus, diphtheria, and acellular pertussis (Td, Tdap) vaccine. Pregnant women should receive 1 dose of Tdap vaccine during each pregnancy. The dose should be obtained regardless of the length of time since the last dose. Immunization is preferred during the 27th-36th week of gestation. An adult who has not previously received Tdap or who does not know her vaccine status should receive 1 dose of Tdap. This initial dose should be followed by tetanus and diphtheria toxoids (Td) booster doses every 10 years. Adults with an unknown or incomplete history of completing a 3-dose immunization series with Td-containing vaccines should begin or complete a primary immunization series including a Tdap dose. Adults should receive a Td booster every 10 years.  Varicella vaccine. An adult without evidence of immunity to varicella should receive 2 doses or a second dose if she has previously received 1 dose. Pregnant females who do not have evidence of immunity should receive the first dose after pregnancy. This first dose should be obtained before leaving the health care facility. The second dose should be obtained 4-8 weeks after the first dose.  Human papillomavirus (HPV) vaccine. Females aged 13-26 years who have not received the vaccine previously should obtain the 3-dose series. The vaccine is not recommended for use in pregnant females. However, pregnancy testing is not needed before receiving a dose. If a female is found to be pregnant after receiving a dose, no treatment is needed. In that case, the remaining doses should be delayed until after the pregnancy. Immunization is recommended for any person with an immunocompromised condition through the age of 61 years if she did not get any or all doses earlier. During the  3-dose series, the second dose should be obtained 4-8 weeks after the first dose. The third dose should be obtained 24 weeks after the first dose and 16 weeks after the second dose.  Zoster vaccine. One dose is recommended for adults aged 30 years or older unless certain conditions are present.  Measles, mumps, and rubella (MMR) vaccine. Adults born  before 1957 generally are considered immune to measles and mumps. Adults born in 1957 or later should have 1 or more doses of MMR vaccine unless there is a contraindication to the vaccine or there is laboratory evidence of immunity to each of the three diseases. A routine second dose of MMR vaccine should be obtained at least 28 days after the first dose for students attending postsecondary schools, health care workers, or international travelers. People who received inactivated measles vaccine or an unknown type of measles vaccine during 1963-1967 should receive 2 doses of MMR vaccine. People who received inactivated mumps vaccine or an unknown type of mumps vaccine before 1979 and are at high risk for mumps infection should consider immunization with 2 doses of MMR vaccine. For females of childbearing age, rubella immunity should be determined. If there is no evidence of immunity, females who are not pregnant should be vaccinated. If there is no evidence of immunity, females who are pregnant should delay immunization until after pregnancy. Unvaccinated health care workers born before 1957 who lack laboratory evidence of measles, mumps, or rubella immunity or laboratory confirmation of disease should consider measles and mumps immunization with 2 doses of MMR vaccine or rubella immunization with 1 dose of MMR vaccine.  Pneumococcal 13-valent conjugate (PCV13) vaccine. When indicated, a person who is uncertain of his immunization history and has no record of immunization should receive the PCV13 vaccine. All adults 65 years of age and older should receive this  vaccine. An adult aged 19 years or older who has certain medical conditions and has not been previously immunized should receive 1 dose of PCV13 vaccine. This PCV13 should be followed with a dose of pneumococcal polysaccharide (PPSV23) vaccine. Adults who are at high risk for pneumococcal disease should obtain the PPSV23 vaccine at least 8 weeks after the dose of PCV13 vaccine. Adults older than 71 years of age who have normal immune system function should obtain the PPSV23 vaccine dose at least 1 year after the dose of PCV13 vaccine.  Pneumococcal polysaccharide (PPSV23) vaccine. When PCV13 is also indicated, PCV13 should be obtained first. All adults aged 65 years and older should be immunized. An adult younger than age 65 years who has certain medical conditions should be immunized. Any person who resides in a nursing home or long-term care facility should be immunized. An adult smoker should be immunized. People with an immunocompromised condition and certain other conditions should receive both PCV13 and PPSV23 vaccines. People with human immunodeficiency virus (HIV) infection should be immunized as soon as possible after diagnosis. Immunization during chemotherapy or radiation therapy should be avoided. Routine use of PPSV23 vaccine is not recommended for American Indians, [place] Natives, or people younger than 65 years unless there are medical conditions that require PPSV23 vaccine. When indicated, people who have unknown immunization and have no record of immunization should receive PPSV23 vaccine. One-time revaccination 5 years after the first dose of PPSV23 is recommended for people aged 19-64 years who have chronic kidney failure, nephrotic syndrome, asplenia, or immunocompromised conditions. People who received 1-2 doses of PPSV23 before age 65 years should receive another dose of PPSV23 vaccine at age 65 years or later if at least 5 years have passed since the previous dose. Doses of PPSV23 are not  needed for people immunized with PPSV23 at or after age 65 years.  Meningococcal vaccine. Adults with asplenia or persistent complement component deficiencies should receive 2 doses of quadrivalent meningococcal conjugate (MenACWY-D) vaccine. The doses should be obtained   at least 2 months apart. Microbiologists working with certain meningococcal bacteria, [place] recruits, people at risk during an outbreak, and people who travel to or live in countries with a high rate of meningitis should be immunized. A first-year college student up through age 34 years who is living in a residence hall should receive a dose if she did not receive a dose on or after her 16th birthday. Adults who have certain high-risk conditions should receive one or more doses of vaccine.  Hepatitis A vaccine. Adults who wish to be protected from this disease, have certain high-risk conditions, work with hepatitis A-infected animals, work in hepatitis A research labs, or travel to or work in countries with a high rate of hepatitis A should be immunized. Adults who were previously unvaccinated and who anticipate close contact with an international adoptee during the first 60 days after arrival in the [place] from a country with a high rate of hepatitis A should be immunized.  Hepatitis B vaccine. Adults who wish to be protected from this disease, have certain high-risk conditions, may be exposed to blood or other infectious body fluids, are household contacts or sex partners of hepatitis B positive people, are clients or workers in certain care facilities, or travel to or work in countries with a high rate of hepatitis B should be immunized.  Haemophilus influenzae type b (Hib) vaccine. A previously unvaccinated person with asplenia or sickle cell disease or having a scheduled splenectomy should receive 1 dose of Hib vaccine. Regardless of previous immunization, a recipient of a hematopoietic stem cell transplant should receive a  3-dose series 6-12 months after her successful transplant. Hib vaccine is not recommended for adults with HIV infection. Preventive Services / Frequency Ages 35 to 4 years  Blood pressure check.** / Every 3-5 years.  Lipid and cholesterol check.** / Every 5 years beginning at age 60.  Clinical breast exam.** / Every 3 years for women in their 71s and 10s.  BRCA-related cancer risk assessment.** / For women who have family members with a BRCA-related cancer (breast, ovarian, tubal, or peritoneal cancers).  Pap test.** / Every 2 years from ages 76 through 26. Every 3 years starting at age 61 through age 76 or 93 with a history of 3 consecutive normal Pap tests.  HPV screening.** / Every 3 years from ages 37 through ages 60 to 51 with a history of 3 consecutive normal Pap tests.  Hepatitis C blood test.** / For any individual with known risks for hepatitis C.  Skin self-exam. / Monthly.  Influenza vaccine. / Every year.  Tetanus, diphtheria, and acellular pertussis (Tdap, Td) vaccine.** / Consult your health care provider. Pregnant women should receive 1 dose of Tdap vaccine during each pregnancy. 1 dose of Td every 10 years.  Varicella vaccine.** / Consult your health care provider. Pregnant females who do not have evidence of immunity should receive the first dose after pregnancy.  HPV vaccine. / 3 doses over 6 months, if 93 and younger. The vaccine is not recommended for use in pregnant females. However, pregnancy testing is not needed before receiving a dose.  Measles, mumps, rubella (MMR) vaccine.** / You need at least 1 dose of MMR if you were born in 1957 or later. You may also need a 2nd dose. For females of childbearing age, rubella immunity should be determined. If there is no evidence of immunity, females who are not pregnant should be vaccinated. If there is no evidence of immunity, females who are  pregnant should delay immunization until after pregnancy.  Pneumococcal  13-valent conjugate (PCV13) vaccine.** / Consult your health care provider.  Pneumococcal polysaccharide (PPSV23) vaccine.** / 1 to 2 doses if you smoke cigarettes or if you have certain conditions.  Meningococcal vaccine.** / 1 dose if you are age 68 to 8 years and a Market researcher living in a residence hall, or have one of several medical conditions, you need to get vaccinated against meningococcal disease. You may also need additional booster doses.  Hepatitis A vaccine.** / Consult your health care provider.  Hepatitis B vaccine.** / Consult your health care provider.  Haemophilus influenzae type b (Hib) vaccine.** / Consult your health care provider. Ages 7 to 53 years  Blood pressure check.** / Every year.  Lipid and cholesterol check.** / Every 5 years beginning at age 25 years.  Lung cancer screening. / Every year if you are aged 11-80 years and have a 30-pack-year history of smoking and currently smoke or have quit within the past 15 years. Yearly screening is stopped once you have quit smoking for at least 15 years or develop a health problem that would prevent you from having lung cancer treatment.  Clinical breast exam.** / Every year after age 48 years.  BRCA-related cancer risk assessment.** / For women who have family members with a BRCA-related cancer (breast, ovarian, tubal, or peritoneal cancers).  Mammogram.** / Every year beginning at age 41 years and continuing for as long as you are in good health. Consult with your health care provider.  Pap test.** / Every 3 years starting at age 65 years through age 37 or 70 years with a history of 3 consecutive normal Pap tests.  HPV screening.** / Every 3 years from ages 72 years through ages 60 to 40 years with a history of 3 consecutive normal Pap tests.  Fecal occult blood test (FOBT) of stool. / Every year beginning at age 21 years and continuing until age 5 years. You may not need to do this test if you get  a colonoscopy every 10 years.  Flexible sigmoidoscopy or colonoscopy.** / Every 5 years for a flexible sigmoidoscopy or every 10 years for a colonoscopy beginning at age 35 years and continuing until age 48 years.  Hepatitis C blood test.** / For all people born from [birth year] through 1965 and any individual with known risks for hepatitis C.  Skin self-exam. / Monthly.  Influenza vaccine. / Every year.  Tetanus, diphtheria, and acellular pertussis (Tdap/Td) vaccine.** / Consult your health care provider. Pregnant women should receive 1 dose of Tdap vaccine during each pregnancy. 1 dose of Td every 10 years.  Varicella vaccine.** / Consult your health care provider. Pregnant females who do not have evidence of immunity should receive the first dose after pregnancy.  Zoster vaccine.** / 1 dose for adults aged 30 years or older.  Measles, mumps, rubella (MMR) vaccine.** / You need at least 1 dose of MMR if you were born in 1957 or later. You may also need a second dose. For females of childbearing age, rubella immunity should be determined. If there is no evidence of immunity, females who are not pregnant should be vaccinated. If there is no evidence of immunity, females who are pregnant should delay immunization until after pregnancy.  Pneumococcal 13-valent conjugate (PCV13) vaccine.** / Consult your health care provider.  Pneumococcal polysaccharide (PPSV23) vaccine.** / 1 to 2 doses if you smoke cigarettes or if you have certain conditions.  Meningococcal vaccine.** /  Consult your health care provider.  Hepatitis A vaccine.** / Consult your health care provider.  Hepatitis B vaccine.** / Consult your health care provider.  Haemophilus influenzae type b (Hib) vaccine.** / Consult your health care provider. Ages 35 years and over  Blood pressure check.** / Every year.  Lipid and cholesterol check.** / Every 5 years beginning at age 22 years.  Lung cancer screening. / Every year if you  are aged 6-80 years and have a 30-pack-year history of smoking and currently smoke or have quit within the past 15 years. Yearly screening is stopped once you have quit smoking for at least 15 years or develop a health problem that would prevent you from having lung cancer treatment.  Clinical breast exam.** / Every year after age 108 years.  BRCA-related cancer risk assessment.** / For women who have family members with a BRCA-related cancer (breast, ovarian, tubal, or peritoneal cancers).  Mammogram.** / Every year beginning at age 17 years and continuing for as long as you are in good health. Consult with your health care provider.  Pap test.** / Every 3 years starting at age 47 years through age 51 or 53 years with 3 consecutive normal Pap tests. Testing can be stopped between 65 and 70 years with 3 consecutive normal Pap tests and no abnormal Pap or HPV tests in the past 10 years.  HPV screening.** / Every 3 years from ages 50 years through ages 69 or 74 years with a history of 3 consecutive normal Pap tests. Testing can be stopped between 65 and 70 years with 3 consecutive normal Pap tests and no abnormal Pap or HPV tests in the past 10 years.  Fecal occult blood test (FOBT) of stool. / Every year beginning at age 62 years and continuing until age 58 years. You may not need to do this test if you get a colonoscopy every 10 years.  Flexible sigmoidoscopy or colonoscopy.** / Every 5 years for a flexible sigmoidoscopy or every 10 years for a colonoscopy beginning at age 12 years and continuing until age 40 years.  Hepatitis C blood test.** / For all people born from [birth year] through 1967 and any individual with known risks for hepatitis C.  Osteoporosis screening.** / A one-time screening for women ages 91 years and over and women at risk for fractures or osteoporosis.  Skin self-exam. / Monthly.  Influenza vaccine. / Every year.  Tetanus, diphtheria, and acellular pertussis (Tdap/Td)  vaccine.** / 1 dose of Td every 10 years.  Varicella vaccine.** / Consult your health care provider.  Zoster vaccine.** / 1 dose for adults aged 34 years or older.  Pneumococcal 13-valent conjugate (PCV13) vaccine.** / Consult your health care provider.  Pneumococcal polysaccharide (PPSV23) vaccine.** / 1 dose for all adults aged 29 years and older.  Meningococcal vaccine.** / Consult your health care provider.  Hepatitis A vaccine.** / Consult your health care provider.  Hepatitis B vaccine.** / Consult your health care provider.  Haemophilus influenzae type b (Hib) vaccine.** / Consult your health care provider. ** Family history and personal history of risk and conditions may change your health care provider's recommendations.   This information is not intended to replace advice given to you by your health care provider. Make sure you discuss any questions you have with your health care provider.   Document Released: 12/31/2001 Document Revised: 11/25/2014 Document Reviewed: 04/01/2011 Elsevier Interactive Patient Education 2016 [employer].   Use a probiotic for 1 month

## 2015-09-12 NOTE — Assessment & Plan Note (Signed)
On Metformin Labs 

## 2015-09-12 NOTE — Assessment & Plan Note (Signed)

## 2015-09-12 NOTE — Progress Notes (Signed)
Subjective:  Patient ID: Claudia [name], female    DOB: 1946-10-04  Age: 69 y.o. MRN: [id number]  CC: No chief complaint on file.   HPI  Well exam Claudia [name] presents for HTN, LBP/OA, depression/anxiety f/up. C/o cold sx's x 3 weeks - cough, green nasal d/c.  Doing better after her L TKR  Outpatient Prescriptions Prior to Visit  Medication Sig Dispense Refill  . amLODipine (NORVASC) 2.5 MG tablet Take 1 tablet (2.5 mg total) by mouth daily. 90 tablet 3  . docusate sodium (COLACE) 100 MG capsule Take 1 capsule (100 mg total) by mouth 2 (two) times daily. 60 capsule 3  . gabapentin (NEURONTIN) 600 MG tablet TAKE 1 TABLET THREE TIMES DAILY (Patient taking differently: TAKE 1 TABLET BY MOUTH EVERY MORNING.) 270 tablet 3  . loratadine (CLARITIN) 10 MG tablet Take 1 tablet (10 mg total) by mouth daily. As needed for allergies (Patient taking differently: Take 10 mg by mouth daily as needed for allergies. ) 90 tablet 1  . senna (SENOKOT) 8.6 MG TABS tablet Take 2 tablets (17.2 mg total) by mouth at bedtime. 60 each 3  . DULoxetine (CYMBALTA) 60 MG capsule Take 1 capsule (60 mg total) by mouth daily. For depression 90 capsule 3  . losartan (COZAAR) 100 MG tablet Take 1 tablet (100 mg total) by mouth daily. 90 tablet 3  . meloxicam (MOBIC) 15 MG tablet Take 1 tablet (15 mg total) by mouth daily. 30 tablet 3  . metFORMIN (GLUCOPHAGE) 500 MG tablet TAKE 1 TABLET (500 MG TOTAL) BY MOUTH DAILY WITH BREAKFAST. 180 tablet 2  . omeprazole (PRILOSEC) 40 MG capsule Take 1 capsule (40 mg total) by mouth 2 (two) times daily. (Patient taking differently: Take 40 mg by mouth every morning. ) 180 capsule 3  . valACYclovir (VALTREX) 500 MG tablet Take 1 tablet (500 mg total) by mouth 2 (two) times daily. (Patient taking differently: Take 500 mg by mouth 2 (two) times daily as needed (fever blisters.). ) 60 tablet 1  . acetaminophen (TYLENOL) 500 MG tablet Take 500 mg by mouth every 6 (six) hours as needed.     Marland Kitchen aspirin EC 325 MG tablet Take 1 tablet (325 mg total) by mouth 2 (two) times daily after a meal. (Patient not taking: Reported on 09/12/2015) 60 tablet 0  . INSULIN SYRINGE 1CC/29G (B-D INSULIN SYRINGE) 29G X 1/2" 1 ML MISC As dirrected (Patient not taking: Reported on 09/12/2015) 50 each 3  . ondansetron (ZOFRAN ODT) 8 MG disintegrating tablet Take 1 tablet (8 mg total) by mouth every 8 (eight) hours as needed for nausea or vomiting. (Patient not taking: Reported on 09/12/2015) 20 tablet 0  . polyvinyl alcohol (LIQUIFILM TEARS) 1.4 % ophthalmic solution Place 1-2 drops into both eyes daily as needed for dry eyes.    Marland Kitchen HYDROmorphone (DILAUDID) 2 MG tablet Take 1-2 tablets (2-4 mg total) by mouth every 3 (three) hours as needed for moderate pain or severe pain. (Patient not taking: Reported on 09/12/2015) 80 tablet 0  . methocarbamol (ROBAXIN) 500 MG tablet Take 1 tablet (500 mg total) by mouth every 6 (six) hours as needed for muscle spasms. (Patient not taking: Reported on 09/12/2015) 80 tablet 0  . oxyCODONE-acetaminophen (ROXICET) 5-325 MG per tablet Take 1-2 tablets by mouth every 4 (four) hours as needed for severe pain. (Patient not taking: Reported on 09/12/2015) 90 tablet 0   No facility-administered medications prior to visit.    ROS Review of  Systems  Constitutional: Positive for chills and fatigue. Negative for activity change, appetite change and unexpected weight change.  HENT: Positive for congestion, rhinorrhea, sinus pressure and sore throat. Negative for mouth sores.   Eyes: Negative for visual disturbance.  Respiratory: Positive for cough. Negative for chest tightness and wheezing.   Gastrointestinal: Negative for nausea and abdominal pain.  Genitourinary: Negative for frequency, difficulty urinating and vaginal pain.  Musculoskeletal: Negative for back pain and gait problem.  Skin: Negative for pallor and rash.  Neurological: Negative for dizziness, tremors, weakness,  numbness and headaches.  Psychiatric/Behavioral: Negative for confusion and sleep disturbance. The patient is not nervous/anxious.     Objective:  BP 160/78 mmHg  Pulse 71  Temp(Src) 98 F (36.7 C) (Oral)  Wt 184 lb (83.462 kg)  SpO2 96%  BP Readings from Last 3 Encounters:  09/12/15 160/78  06/03/15 124/44  05/26/15 149/68    Wt Readings from Last 3 Encounters:  09/12/15 184 lb (83.462 kg)  06/01/15 185 lb (83.915 kg)  05/26/15 185 lb (83.915 kg)    Physical Exam  Constitutional: She appears well-developed. No distress.  HENT:  Head: Normocephalic.  Right Ear: External ear normal.  Left Ear: External ear normal.  Nose: Nose normal.  Mouth/Throat: Oropharynx is clear and moist.  Eyes: Conjunctivae are normal. Pupils are equal, round, and reactive to light. Right eye exhibits no discharge. Left eye exhibits no discharge.  Neck: Normal range of motion. Neck supple. No JVD present. No tracheal deviation present. No thyromegaly present.  Cardiovascular: Normal rate, regular rhythm and normal heart sounds.   Pulmonary/Chest: No stridor. No respiratory distress. She has no wheezes.  Abdominal: Soft. Bowel sounds are normal. She exhibits no distension and no mass. There is no tenderness. There is no rebound and no guarding.  Musculoskeletal: She exhibits no edema or tenderness.  Lymphadenopathy:    She has no cervical adenopathy.  Neurological: She displays normal reflexes. No cranial nerve deficit. She exhibits normal muscle tone. Coordination normal.  Skin: No rash noted. No erythema.  Psychiatric: She has a normal mood and affect. Her behavior is normal. Judgment and thought content normal.  L knee healed scar Eryth nasal mucosa  Lab Results  Component Value Date   WBC 12.6* 06/03/2015   HGB 11.2* 06/03/2015   HCT 35.1* 06/03/2015   PLT 316 06/03/2015   GLUCOSE 145* 06/02/2015   CHOL 232* 11/02/2014   TRIG 114.0 11/02/2014   HDL 40.20 11/02/2014   LDLCALC 169*  11/02/2014   ALT 14 05/26/2015   AST 21 05/26/2015   NA 136 06/02/2015   K 4.6 06/02/2015   CL 106 06/02/2015   CREATININE 1.12* 06/02/2015   BUN 22* 06/02/2015   CO2 26 06/02/2015   TSH 3.30 11/02/2014   INR 1.02 05/26/2015   HGBA1C 5.9 07/13/2014    No results found.  Assessment & Plan:   Diagnoses and all orders for this visit:  Essential hypertension  B12 deficiency  Depression  Acute upper respiratory infection  Controlled type 2 diabetes mellitus without complication, without long-term current use of insulin (Adrian)  Well adult exam  Other orders -     DULoxetine (CYMBALTA) 60 MG capsule; Take 1 capsule (60 mg total) by mouth daily. For depression -     meloxicam (MOBIC) 15 MG tablet; Take 1 tablet (15 mg total) by mouth daily. -     metFORMIN (GLUCOPHAGE) 500 MG tablet; Take 1 tablet (500 mg total) by mouth daily with breakfast. -  omeprazole (PRILOSEC) 40 MG capsule; Take 1 capsule (40 mg total) by mouth 2 (two) times daily. -     valACYclovir (VALTREX) 500 MG tablet; Take 1 tablet (500 mg total) by mouth 2 (two) times daily. -     losartan (COZAAR) 100 MG tablet; Take 1 tablet (100 mg total) by mouth daily. -     cefUROXime (CEFTIN) 500 MG tablet; Take 1 tablet (500 mg total) by mouth 2 (two) times daily.  I have discontinued Ms. Grealish's oxyCODONE-acetaminophen, HYDROmorphone, and methocarbamol. I am also having her start on cefUROXime. Additionally, I am having her maintain her INSULIN SYRINGE 1CC/29G, loratadine, gabapentin, acetaminophen, amLODipine, polyvinyl alcohol, docusate sodium, ondansetron, senna, aspirin EC, DULoxetine, meloxicam, metFORMIN, omeprazole, valACYclovir, and losartan.  Meds ordered this encounter  Medications  . DULoxetine (CYMBALTA) 60 MG capsule    Sig: Take 1 capsule (60 mg total) by mouth daily. For depression    Dispense:  90 capsule    Refill:  3  . meloxicam (MOBIC) 15 MG tablet    Sig: Take 1 tablet (15 mg total) by mouth  daily.    Dispense:  30 tablet    Refill:  3  . metFORMIN (GLUCOPHAGE) 500 MG tablet    Sig: Take 1 tablet (500 mg total) by mouth daily with breakfast.    Dispense:  180 tablet    Refill:  3  . omeprazole (PRILOSEC) 40 MG capsule    Sig: Take 1 capsule (40 mg total) by mouth 2 (two) times daily.    Dispense:  180 capsule    Refill:  3    BC/BS ID [id number]  . valACYclovir (VALTREX) 500 MG tablet    Sig: Take 1 tablet (500 mg total) by mouth 2 (two) times daily.    Dispense:  60 tablet    Refill:  11  . losartan (COZAAR) 100 MG tablet    Sig: Take 1 tablet (100 mg total) by mouth daily.    Dispense:  90 tablet    Refill:  3  . cefUROXime (CEFTIN) 500 MG tablet    Sig: Take 1 tablet (500 mg total) by mouth 2 (two) times daily.    Dispense:  20 tablet    Refill:  0     Follow-up: Return in about 4 months (around 01/13/2016).  Walker Kehr, MD

## 2015-09-12 NOTE — Progress Notes (Signed)
Pre visit review using our clinic review tool, if applicable. No additional management support is needed unless otherwise documented below in the visit note. 

## 2015-09-22 ENCOUNTER — Other Ambulatory Visit (INDEPENDENT_AMBULATORY_CARE_PROVIDER_SITE_OTHER): Payer: Commercial Managed Care - HMO

## 2015-09-22 ENCOUNTER — Telehealth: Payer: Self-pay | Admitting: Internal Medicine

## 2015-09-22 DIAGNOSIS — F32A Depression, unspecified: Secondary | ICD-10-CM

## 2015-09-22 DIAGNOSIS — Z0000 Encounter for general adult medical examination without abnormal findings: Secondary | ICD-10-CM

## 2015-09-22 DIAGNOSIS — J069 Acute upper respiratory infection, unspecified: Secondary | ICD-10-CM | POA: Diagnosis not present

## 2015-09-22 DIAGNOSIS — F329 Major depressive disorder, single episode, unspecified: Secondary | ICD-10-CM

## 2015-09-22 DIAGNOSIS — R739 Hyperglycemia, unspecified: Secondary | ICD-10-CM

## 2015-09-22 DIAGNOSIS — E538 Deficiency of other specified B group vitamins: Secondary | ICD-10-CM | POA: Diagnosis not present

## 2015-09-22 DIAGNOSIS — I10 Essential (primary) hypertension: Secondary | ICD-10-CM

## 2015-09-22 DIAGNOSIS — E119 Type 2 diabetes mellitus without complications: Secondary | ICD-10-CM

## 2015-09-22 LAB — URINALYSIS, ROUTINE W REFLEX MICROSCOPIC
Bilirubin Urine: NEGATIVE
KETONES UR: NEGATIVE
LEUKOCYTES UA: NEGATIVE
Nitrite: NEGATIVE
SPECIFIC GRAVITY, URINE: 1.02 (ref 1.000–1.030)
Total Protein, Urine: NEGATIVE
Urine Glucose: NEGATIVE
Urobilinogen, UA: 0.2 (ref 0.0–1.0)
pH: 5.5 (ref 5.0–8.0)

## 2015-09-22 LAB — CBC WITH DIFFERENTIAL/PLATELET
BASOS PCT: 0.6 % (ref 0.0–3.0)
Basophils Absolute: 0 10*3/uL (ref 0.0–0.1)
EOS PCT: 3.4 % (ref 0.0–5.0)
Eosinophils Absolute: 0.2 10*3/uL (ref 0.0–0.7)
HCT: 41.2 % (ref 36.0–46.0)
Hemoglobin: 13.5 g/dL (ref 12.0–15.0)
Lymphocytes Relative: 34.4 % (ref 12.0–46.0)
Lymphs Abs: 2.1 10*3/uL (ref 0.7–4.0)
MCHC: 32.7 g/dL (ref 30.0–36.0)
MCV: 87.4 fl (ref 78.0–100.0)
MONO ABS: 0.3 10*3/uL (ref 0.1–1.0)
Monocytes Relative: 5.7 % (ref 3.0–12.0)
NEUTROS ABS: 3.3 10*3/uL (ref 1.4–7.7)
NEUTROS PCT: 55.9 % (ref 43.0–77.0)
PLATELETS: 320 10*3/uL (ref 150.0–400.0)
RBC: 4.71 Mil/uL (ref 3.87–5.11)
RDW: 13.8 % (ref 11.5–15.5)
WBC: 6 10*3/uL (ref 4.0–10.5)

## 2015-09-22 LAB — LIPID PANEL
CHOL/HDL RATIO: 5
Cholesterol: 212 mg/dL — ABNORMAL HIGH (ref 0–200)
HDL: 43.5 mg/dL (ref >39.00)
LDL Cholesterol: 144 mg/dL — ABNORMAL HIGH (ref 0–99)
NONHDL: 168.9
Triglycerides: 124 mg/dL (ref 0.0–149.0)
VLDL: 24.8 mg/dL (ref 0.0–40.0)

## 2015-09-22 LAB — BASIC METABOLIC PANEL
BUN: 23 mg/dL (ref 6–23)
CALCIUM: 9.4 mg/dL (ref 8.4–10.5)
CO2: 28 mEq/L (ref 19–32)
Chloride: 107 mEq/L (ref 96–112)
Creatinine, Ser: 1.07 mg/dL (ref 0.40–1.20)
GFR: 53.62 mL/min — AB (ref >60.00)
Glucose, Bld: 131 mg/dL — ABNORMAL HIGH (ref 70–99)
Potassium: 4.3 mEq/L (ref 3.5–5.1)
SODIUM: 141 meq/L (ref 135–145)

## 2015-09-22 LAB — HEPATIC FUNCTION PANEL
ALK PHOS: 91 U/L (ref 39–117)
ALT: 10 U/L (ref 0–35)
AST: 15 U/L (ref 0–37)
Albumin: 3.8 g/dL (ref 3.5–5.2)
BILIRUBIN DIRECT: 0.1 mg/dL (ref 0.0–0.3)
Total Bilirubin: 0.5 mg/dL (ref 0.2–1.2)
Total Protein: 6.5 g/dL (ref 6.0–8.3)

## 2015-09-22 LAB — HEMOGLOBIN A1C: HEMOGLOBIN A1C: 6.1 % (ref 4.6–6.5)

## 2015-09-22 LAB — TSH: TSH: 3.49 u[IU]/mL (ref 0.35–4.50)

## 2015-09-22 NOTE — Telephone Encounter (Signed)
Pt came by front desk after getting her labs drawn this morning and she is requesting an order be added to check her A1C based on some symptoms she has been having.  Lab asked her to come up to our office to request the order be placed to check this.

## 2015-09-22 NOTE — Telephone Encounter (Signed)
Add on sheet faxed down to [facility] for Hgb A1c.

## 2015-09-24 NOTE — Telephone Encounter (Signed)
A1c is nl Thx

## 2015-11-03 DIAGNOSIS — M7052 Other bursitis of knee, left knee: Secondary | ICD-10-CM | POA: Diagnosis not present

## 2015-11-03 DIAGNOSIS — M7632 Iliotibial band syndrome, left leg: Secondary | ICD-10-CM | POA: Diagnosis not present

## 2015-11-03 DIAGNOSIS — Z96652 Presence of left artificial knee joint: Secondary | ICD-10-CM | POA: Diagnosis not present

## 2015-11-03 DIAGNOSIS — Z471 Aftercare following joint replacement surgery: Secondary | ICD-10-CM | POA: Diagnosis not present

## 2016-01-05 ENCOUNTER — Encounter: Payer: Self-pay | Admitting: Internal Medicine

## 2016-01-05 ENCOUNTER — Ambulatory Visit (INDEPENDENT_AMBULATORY_CARE_PROVIDER_SITE_OTHER): Payer: Commercial Managed Care - HMO | Admitting: Internal Medicine

## 2016-01-05 ENCOUNTER — Telehealth: Payer: Self-pay | Admitting: Internal Medicine

## 2016-01-05 VITALS — BP 124/80 | HR 79 | Wt 190.0 lb

## 2016-01-05 DIAGNOSIS — D485 Neoplasm of uncertain behavior of skin: Secondary | ICD-10-CM

## 2016-01-05 DIAGNOSIS — S060X1A Concussion with loss of consciousness of 30 minutes or less, initial encounter: Secondary | ICD-10-CM | POA: Diagnosis not present

## 2016-01-05 DIAGNOSIS — S060X9A Concussion with loss of consciousness of unspecified duration, initial encounter: Secondary | ICD-10-CM | POA: Insufficient documentation

## 2016-01-05 MED ORDER — PHOSPHATIDYLSERINE-DHA-EPA 100-19.5-6.5 MG PO CAPS: 1.0000 | ORAL_CAPSULE | Freq: Every day | ORAL | Status: DC

## 2016-01-05 NOTE — Progress Notes (Signed)
Subjective:  Patient ID: Claudia [name], female    DOB: [birth year]  Age: 70 y.o. MRN: [id number]  CC: No chief complaint on file.   HPI Claudia [name] presents for fell backwards walking out of the [employer] store 2 wks ago. Pt hit her head in the back - may have "blacked out" , pt felt "dazed". C/o HAs every day, some nausea. C/o L wrist and R ankle pain. C/o skin lesions  Outpatient Prescriptions Prior to Visit  Medication Sig Dispense Refill  . acetaminophen (TYLENOL) 500 MG tablet Take 500 mg by mouth every 6 (six) hours as needed.    Marland Kitchen amLODipine (NORVASC) 2.5 MG tablet Take 1 tablet (2.5 mg total) by mouth daily. 90 tablet 3  . aspirin EC 325 MG tablet Take 1 tablet (325 mg total) by mouth 2 (two) times daily after a meal. 60 tablet 0  . cefUROXime (CEFTIN) 500 MG tablet Take 1 tablet (500 mg total) by mouth 2 (two) times daily. 20 tablet 0  . docusate sodium (COLACE) 100 MG capsule Take 1 capsule (100 mg total) by mouth 2 (two) times daily. 60 capsule 3  . DULoxetine (CYMBALTA) 60 MG capsule Take 1 capsule (60 mg total) by mouth daily. For depression 90 capsule 3  . gabapentin (NEURONTIN) 600 MG tablet TAKE 1 TABLET THREE TIMES DAILY (Patient taking differently: TAKE 1 TABLET BY MOUTH EVERY MORNING.) 270 tablet 3  . INSULIN SYRINGE 1CC/29G (B-D INSULIN SYRINGE) 29G X 1/2" 1 ML MISC As dirrected 50 each 3  . loratadine (CLARITIN) 10 MG tablet Take 1 tablet (10 mg total) by mouth daily. As needed for allergies (Patient taking differently: Take 10 mg by mouth daily as needed for allergies. ) 90 tablet 1  . losartan (COZAAR) 100 MG tablet Take 1 tablet (100 mg total) by mouth daily. 90 tablet 3  . meloxicam (MOBIC) 15 MG tablet Take 1 tablet (15 mg total) by mouth daily. 30 tablet 3  . metFORMIN (GLUCOPHAGE) 500 MG tablet Take 1 tablet (500 mg total) by mouth daily with breakfast. 180 tablet 3  . omeprazole (PRILOSEC) 40 MG capsule Take 1 capsule (40 mg total) by mouth 2 (two) times  daily. 180 capsule 3  . ondansetron (ZOFRAN ODT) 8 MG disintegrating tablet Take 1 tablet (8 mg total) by mouth every 8 (eight) hours as needed for nausea or vomiting. 20 tablet 0  . polyvinyl alcohol (LIQUIFILM TEARS) 1.4 % ophthalmic solution Place 1-2 drops into both eyes daily as needed for dry eyes.    Marland Kitchen senna (SENOKOT) 8.6 MG TABS tablet Take 2 tablets (17.2 mg total) by mouth at bedtime. 60 each 3  . valACYclovir (VALTREX) 500 MG tablet Take 1 tablet (500 mg total) by mouth 2 (two) times daily. 60 tablet 11   No facility-administered medications prior to visit.    ROS Review of Systems  Constitutional: Positive for fatigue. Negative for fever, chills, activity change, appetite change and unexpected weight change.  HENT: Positive for congestion. Negative for mouth sores and sinus pressure.   Eyes: Negative for visual disturbance.  Respiratory: Negative for cough, chest tightness and shortness of breath.   Gastrointestinal: Positive for nausea. Negative for vomiting, abdominal pain and constipation.  Genitourinary: Negative for frequency, difficulty urinating and vaginal pain.  Musculoskeletal: Negative for back pain and gait problem.  Skin: Negative for pallor and rash.  Neurological: Positive for headaches. Negative for dizziness, tremors, weakness and numbness.  Psychiatric/Behavioral: Positive for decreased concentration. Negative  for suicidal ideas, confusion and sleep disturbance. The patient is nervous/anxious.     Objective:  BP 124/80 mmHg  Pulse 79  Wt 190 lb (86.183 kg)  SpO2 97%  BP Readings from Last 3 Encounters:  01/05/16 124/80  09/12/15 139/84  06/03/15 124/44    Wt Readings from Last 3 Encounters:  01/05/16 190 lb (86.183 kg)  09/12/15 184 lb (83.462 kg)  06/01/15 185 lb (83.915 kg)    Physical Exam  Constitutional: She appears well-developed. No distress.  HENT:  Head: Normocephalic.  Right Ear: External ear normal.  Left Ear: External ear normal.   Nose: Nose normal.  Mouth/Throat: Oropharynx is clear and moist.  Eyes: Conjunctivae are normal. Pupils are equal, round, and reactive to light. Right eye exhibits no discharge. Left eye exhibits no discharge.  Neck: Normal range of motion. Neck supple. No JVD present. No tracheal deviation present. No thyromegaly present.  Cardiovascular: Normal rate, regular rhythm and normal heart sounds.   Pulmonary/Chest: No stridor. No respiratory distress. She has no wheezes.  Abdominal: Soft. Bowel sounds are normal. She exhibits no distension and no mass. There is no tenderness. There is no rebound and no guarding.  Musculoskeletal: She exhibits no edema or tenderness.  Lymphadenopathy:    She has no cervical adenopathy.  Neurological: She displays normal reflexes. No cranial nerve deficit. She exhibits normal muscle tone. Coordination normal.  Skin: No rash noted. No erythema.  Psychiatric: Her behavior is normal. Judgment and thought content normal.   R 2nd and 1st toes calluces AKs Lab Results  Component Value Date   WBC 6.0 09/22/2015   HGB 13.5 09/22/2015   HCT 41.2 09/22/2015   PLT 320.0 09/22/2015   GLUCOSE 131* 09/22/2015   CHOL 212* 09/22/2015   TRIG 124.0 09/22/2015   HDL 43.50 09/22/2015   LDLCALC 144* 09/22/2015   ALT 10 09/22/2015   AST 15 09/22/2015   NA 141 09/22/2015   K 4.3 09/22/2015   CL 107 09/22/2015   CREATININE 1.07 09/22/2015   BUN 23 09/22/2015   CO2 28 09/22/2015   TSH 3.49 09/22/2015   INR 1.02 05/26/2015   HGBA1C 6.1 09/22/2015    No results found.  Assessment & Plan:   Diagnoses and all orders for this visit:  Concussion with loss of consciousness, 30 minutes or less, initial encounter -     CT Head Wo Contrast  Other orders -     Phosphatidylserine-DHA-EPA (VAYACOG) 100-19.5-6.5 MG CAPS; Take 1 capsule by mouth daily. -     Ambulatory referral to Dermatology  I am having Claudia [name] start on Phosphatidylserine-DHA-EPA. I am also having her  maintain her INSULIN SYRINGE 1CC/29G, loratadine, gabapentin, acetaminophen, amLODipine, polyvinyl alcohol, docusate sodium, ondansetron, senna, aspirin EC, DULoxetine, meloxicam, metFORMIN, omeprazole, valACYclovir, losartan, and cefUROXime.  Meds ordered this encounter  Medications  . Phosphatidylserine-DHA-EPA (VAYACOG) 100-19.5-6.5 MG CAPS    Sig: Take 1 capsule by mouth daily.    Dispense:  30 capsule    Refill:  11     Follow-up: Return in about 2 weeks (around 01/19/2016).  Walker Kehr, MD

## 2016-01-05 NOTE — Assessment & Plan Note (Addendum)
12/23/15 LOC - ?seconds CT head Vayacog

## 2016-01-05 NOTE — Assessment & Plan Note (Signed)
Derm ref

## 2016-01-05 NOTE — Telephone Encounter (Signed)
Pt is going to check to see which would be the better price option for her medication Phosphatidylserine-DHA-EPA (VAYACOG) 100-19.5-6.5 MG CAPS [id number]   She will call back when she figures it out

## 2016-01-05 NOTE — Patient Instructions (Signed)
Concussion, Adult  A concussion, or closed-head injury, is a brain injury caused by a direct blow to the head or by a quick and sudden movement (jolt) of the head or neck. Concussions are usually not life-threatening. Even so, the effects of a concussion can be serious. If you have had a concussion before, you are more likely to experience concussion-like symptoms after a direct blow to the head.   CAUSES   Direct blow to the head, such as from running into another player during a soccer game, being hit in a fight, or hitting your head on a hard surface.   A jolt of the head or neck that causes the brain to move back and forth inside the skull, such as in a car crash.  SIGNS AND SYMPTOMS  The signs of a concussion can be hard to notice. Early on, they may be missed by you, family members, and health care providers. You may look fine but act or feel differently.  Symptoms are usually temporary, but they may last for days, weeks, or even longer. Some symptoms may appear right away while others may not show up for hours or days. Every head injury is different. Symptoms include:   Mild to moderate headaches that will not go away.   A feeling of pressure inside your head.   Having more trouble than usual:    Learning or remembering things you have heard.    Answering questions.    Paying attention or concentrating.    Organizing daily tasks.    Making decisions and solving problems.   Slowness in thinking, acting or reacting, speaking, or reading.   Getting lost or being easily confused.   Feeling tired all the time or lacking energy (fatigued).   Feeling drowsy.   Sleep disturbances.    Sleeping more than usual.    Sleeping less than usual.    Trouble falling asleep.    Trouble sleeping (insomnia).   Loss of balance or feeling lightheaded or dizzy.   Nausea or vomiting.   Numbness or tingling.   Increased sensitivity to:    Sounds.    Lights.    Distractions.   Vision problems or eyes that tire  easily.   Diminished sense of taste or smell.   Ringing in the ears.   Mood changes such as feeling sad or anxious.   Becoming easily irritated or angry for little or no reason.   Lack of motivation.   Seeing or hearing things other people do not see or hear (hallucinations).  DIAGNOSIS  Your health care provider can usually diagnose a concussion based on a description of your injury and symptoms. He or she will ask whether you passed out (lost consciousness) and whether you are having trouble remembering events that happened right before and during your injury.  Your evaluation might include:   A brain scan to look for signs of injury to the brain. Even if the test shows no injury, you may still have a concussion.   Blood tests to be sure other problems are not present.  TREATMENT   Concussions are usually treated in an emergency department, in urgent care, or at a clinic. You may need to stay in the hospital overnight for further treatment.   Tell your health care provider if you are taking any medicines, including prescription medicines, over-the-counter medicines, and natural remedies. Some medicines, such as blood thinners (anticoagulants) and aspirin, may increase the chance of complications. Also tell your health care   your age, and how healthy you were before the concussion.  Most people with mild injuries recover fully. Recovery can take time. In general, recovery is slower in older persons. Also, persons who have had a concussion in the past or have other medical problems may find that it takes longer to recover from their current injury. HOME  CARE INSTRUCTIONS General Instructions  Carefully follow the directions your health care provider gave you.  Only take over-the-counter or prescription medicines for pain, discomfort, or fever as directed by your health care provider.  Take only those medicines that your health care provider has approved.  Do not drink alcohol until your health care provider says you are well enough to do so. Alcohol and certain other drugs may slow your recovery and can put you at risk of further injury.  If it is harder than usual to remember things, write them down.  If you are easily distracted, try to do one thing at a time. For example, do not try to watch TV while fixing dinner.  Talk with family members or close friends when making important decisions.  Keep all follow-up appointments. Repeated evaluation of your symptoms is recommended for your recovery.  Watch your symptoms and tell others to do the same. Complications sometimes occur after a concussion. Older adults with a brain injury may have a higher risk of serious complications, such as a blood clot on the brain.  Tell your teachers, school nurse, school counselor, coach, athletic trainer, or work Freight forwarder about your injury, symptoms, and restrictions. Tell them about what you can or cannot do. They should watch for:  Increased problems with attention or concentration.  Increased difficulty remembering or learning new information.  Increased time needed to complete tasks or assignments.  Increased irritability or decreased ability to cope with stress.  Increased symptoms.  Rest. Rest helps the brain to heal. Make sure you:  Get plenty of sleep at night. Avoid staying up late at night.  Keep the same bedtime hours on weekends and weekdays.  Rest during the day. Take daytime naps or rest breaks when you feel tired.  Limit activities that require a lot of thought or concentration. These include:  Doing homework or job-related  work.  Watching TV.  Working on the computer.  Avoid any situation where there is potential for another head injury (football, hockey, soccer, basketball, martial arts, downhill snow sports and horseback riding). Your condition will get worse every time you experience a concussion. You should avoid these activities until you are evaluated by the appropriate follow-up health care providers. Returning To Your Regular Activities You will need to return to your normal activities slowly, not all at once. You must give your body and brain enough time for recovery.  Do not return to sports or other athletic activities until your health care provider tells you it is safe to do so.  Ask your health care provider when you can drive, ride a bicycle, or operate heavy machinery. Your ability to react may be slower after a brain injury. Never do these activities if you are dizzy.  Ask your health care provider about when you can return to work or school. Preventing Another Concussion It is very important to avoid another brain injury, especially before you have recovered. In rare cases, another injury can lead to permanent brain damage, brain swelling, or death. The risk of this is greatest during the first 7-10 days after a head injury. Avoid injuries by:  Wearing a  seat belt when riding in a car.  Drinking alcohol only in moderation.  Wearing a helmet when biking, skiing, skateboarding, skating, or doing similar activities.  Avoiding activities that could lead to a second concussion, such as contact or recreational sports, until your health care provider says it is okay.  Taking safety measures in your home.  Remove clutter and tripping hazards from floors and stairways.  Use grab bars in bathrooms and handrails by stairs.  Place non-slip mats on floors and in bathtubs.  Improve lighting in dim areas. SEEK MEDICAL CARE IF:  You have increased problems paying attention or  concentrating.  You have increased difficulty remembering or learning new information.  You need more time to complete tasks or assignments than before.  You have increased irritability or decreased ability to cope with stress.  You have more symptoms than before. Seek medical care if you have any of the following symptoms for more than 2 weeks after your injury:  Lasting (chronic) headaches.  Dizziness or balance problems.  Nausea.  Vision problems.  Increased sensitivity to noise or light.  Depression or mood swings.  Anxiety or irritability.  Memory problems.  Difficulty concentrating or paying attention.  Sleep problems.  Feeling tired all the time. SEEK IMMEDIATE MEDICAL CARE IF:  You have severe or worsening headaches. These may be a sign of a blood clot in the brain.  You have weakness (even if only in one hand, leg, or part of the face).  You have numbness.  You have decreased coordination.  You vomit repeatedly.  You have increased sleepiness.  One pupil is larger than the other.  You have convulsions.  You have slurred speech.  You have increased confusion. This may be a sign of a blood clot in the brain.  You have increased restlessness, agitation, or irritability.  You are unable to recognize people or places.  You have neck pain.  It is difficult to wake you up.  You have unusual behavior changes.  You lose consciousness. MAKE SURE YOU:  Understand these instructions.  Will watch your condition.  Will get help right away if you are not doing well or get worse.   This information is not intended to replace advice given to you by your health care provider. Make sure you discuss any questions you have with your health care provider.   Document Released: 01/25/2004 Document Revised: 11/25/2014 Document Reviewed: 05/27/2013 Elsevier Interactive Patient Education 2016 [employer]   Post-Concussion Syndrome Post-concussion  syndrome describes the symptoms that can occur after a head injury. These symptoms can last from weeks to months. CAUSES  It is not clear why some head injuries cause post-concussion syndrome. It can occur whether your head injury was mild or severe and whether you were wearing head protection or not.  SIGNS AND SYMPTOMS  Memory difficulties.  Dizziness.  Headaches.  Double vision or blurry vision.  Sensitivity to light.  Hearing difficulties.  Depression.  Tiredness.  Weakness.  Difficulty with concentration.  Difficulty sleeping or staying asleep.  Vomiting.  Poor balance or instability on your feet.  Slow reaction time.  Difficulty learning and remembering things you have heard. DIAGNOSIS  There is no test to determine whether you have post-concussion syndrome. Your health care provider may order an imaging scan of your brain, such as a CT scan, to check for other problems that may be causing your symptoms (such as a severe injury inside your skull). TREATMENT  Usually, these problems disappear over  time without medical care. Your health care provider may prescribe medicine to help ease your symptoms. It is important to follow up with a neurologist to evaluate your recovery and address any lingering symptoms or issues. HOME CARE INSTRUCTIONS   Take medicines only as directed by your health care provider. Do not take aspirin. Aspirin can slow blood clotting.  Sleep with your head slightly elevated to help with headaches.  Avoid any situation where there is potential for another head injury. This includes football, hockey, soccer, basketball, martial arts, downhill snow sports, and horseback riding. Your condition will get worse every time you experience a concussion. You should avoid these activities until you are evaluated by the appropriate follow-up health care providers.  Keep all follow-up visits as directed by your health care provider. This is important. SEEK  MEDICAL CARE IF:  You have increased problems paying attention or concentrating.  You have increased difficulty remembering or learning new information.  You need more time to complete tasks or assignments than before.  You have increased irritability or decreased ability to cope with stress.  You have more symptoms than before. Seek medical care if you have any of the following symptoms for more than two weeks after your injury:  Lasting (chronic) headaches.  Dizziness or balance problems.  Nausea.  Vision problems.  Increased sensitivity to noise or light.  Depression or mood swings.  Anxiety or irritability.  Memory problems.  Difficulty concentrating or paying attention.  Sleep problems.  Feeling tired all the time. SEEK IMMEDIATE MEDICAL CARE IF:  You have confusion or unusual drowsiness.  Others find it difficult to wake you up.  You have nausea or persistent, forceful vomiting.  You feel like you are moving when you are not (vertigo). Your eyes may move rapidly back and forth.  You have convulsions or faint.  You have severe, persistent headaches that are not relieved by medicine.  You cannot use your arms or legs normally.  One of your pupils is larger than the other.  You have clear or bloody discharge from your nose or ears.  Your problems are getting worse, not better. MAKE SURE YOU:  Understand these instructions.  Will watch your condition.  Will get help right away if you are not doing well or get worse.   This information is not intended to replace advice given to you by your health care provider. Make sure you discuss any questions you have with your health care provider.   Document Released: 04/26/2002 Document Revised: 11/25/2014 Document Reviewed: 02/09/2014 Elsevier Interactive Patient Education [employer].

## 2016-01-05 NOTE — Progress Notes (Signed)
Pre visit review using our clinic review tool, if applicable. No additional management support is needed unless otherwise documented below in the visit note. 

## 2016-01-08 ENCOUNTER — Telehealth: Payer: Self-pay

## 2016-01-08 ENCOUNTER — Ambulatory Visit (INDEPENDENT_AMBULATORY_CARE_PROVIDER_SITE_OTHER)
Admission: RE | Admit: 2016-01-08 | Discharge: 2016-01-08 | Disposition: A | Discharge status: Home / Self Care | Payer: Commercial Managed Care - HMO | Source: Ambulatory Visit | Attending: Internal Medicine | Admitting: Internal Medicine

## 2016-01-08 DIAGNOSIS — S060X1A Concussion with loss of consciousness of 30 minutes or less, initial encounter: Secondary | ICD-10-CM

## 2016-01-08 DIAGNOSIS — R51 Headache: Secondary | ICD-10-CM | POA: Diagnosis not present

## 2016-01-08 NOTE — Telephone Encounter (Signed)
CT OK - no bleeding. Plan - as we discussed Thx

## 2016-01-08 NOTE — Telephone Encounter (Signed)
[place] [facility] called to inform PCP that results are in for for pt CT head that was ordered.   Nothing indicated at this time but if sx persist an MRI would be recommended.

## 2016-01-11 ENCOUNTER — Telehealth: Payer: Self-pay | Admitting: Internal Medicine

## 2016-01-11 NOTE — Telephone Encounter (Signed)
CT was OK. Plan - as we discussed  "IMPRESSION: No acute intracranial hemorrhage.  Mild age-related atrophy and chronic microvascular ischemic disease."   Thx

## 2016-01-11 NOTE — Telephone Encounter (Signed)
Is requesting call back with CT results

## 2016-01-12 NOTE — Telephone Encounter (Signed)
LVM for pt to call back as soon as possible.   

## 2016-01-15 ENCOUNTER — Ambulatory Visit: Payer: Commercial Managed Care - HMO | Admitting: Internal Medicine

## 2016-01-24 NOTE — Telephone Encounter (Signed)
Pt informed of CT results

## 2016-01-30 ENCOUNTER — Ambulatory Visit (INDEPENDENT_AMBULATORY_CARE_PROVIDER_SITE_OTHER): Payer: Commercial Managed Care - HMO | Admitting: Internal Medicine

## 2016-01-30 ENCOUNTER — Encounter: Payer: Self-pay | Admitting: Internal Medicine

## 2016-01-30 VITALS — BP 124/70 | HR 77 | Wt 191.0 lb

## 2016-01-30 DIAGNOSIS — M542 Cervicalgia: Secondary | ICD-10-CM

## 2016-01-30 DIAGNOSIS — F329 Major depressive disorder, single episode, unspecified: Secondary | ICD-10-CM

## 2016-01-30 DIAGNOSIS — F32A Depression, unspecified: Secondary | ICD-10-CM

## 2016-01-30 DIAGNOSIS — G4700 Insomnia, unspecified: Secondary | ICD-10-CM | POA: Diagnosis not present

## 2016-01-30 DIAGNOSIS — S060X1A Concussion with loss of consciousness of 30 minutes or less, initial encounter: Secondary | ICD-10-CM | POA: Diagnosis not present

## 2016-01-30 DIAGNOSIS — I10 Essential (primary) hypertension: Secondary | ICD-10-CM | POA: Diagnosis not present

## 2016-01-30 DIAGNOSIS — R5382 Chronic fatigue, unspecified: Secondary | ICD-10-CM

## 2016-01-30 MED ORDER — TRAZODONE HCL 50 MG PO TABS: 25.0000 mg | ORAL_TABLET | Freq: Every evening | ORAL | Status: DC | PRN

## 2016-01-30 NOTE — Progress Notes (Signed)
Pre visit review using our clinic review tool, if applicable. No additional management support is needed unless otherwise documented below in the visit note. 

## 2016-01-30 NOTE — Assessment & Plan Note (Signed)
Treat insomnia w/Trazodone

## 2016-01-30 NOTE — Assessment & Plan Note (Signed)
Trazodone at hs 

## 2016-01-30 NOTE — Progress Notes (Signed)
Subjective:  Patient ID: Claudia [name], female    DOB: 1946-01-31  Age: 70 y.o. MRN: [id number]  CC: No chief complaint on file.   HPI Rima B Demma presents for a concussion f/u. F/u HTN. C/o HAs and B shoulder pains - a little better. Pt is using Tylenol. C/o insomnia  Outpatient Prescriptions Prior to Visit  Medication Sig Dispense Refill  . acetaminophen (TYLENOL) 500 MG tablet Take 500 mg by mouth every 6 (six) hours as needed.    Marland Kitchen amLODipine (NORVASC) 2.5 MG tablet Take 1 tablet (2.5 mg total) by mouth daily. 90 tablet 3  . aspirin EC 325 MG tablet Take 1 tablet (325 mg total) by mouth 2 (two) times daily after a meal. 60 tablet 0  . docusate sodium (COLACE) 100 MG capsule Take 1 capsule (100 mg total) by mouth 2 (two) times daily. 60 capsule 3  . DULoxetine (CYMBALTA) 60 MG capsule Take 1 capsule (60 mg total) by mouth daily. For depression 90 capsule 3  . gabapentin (NEURONTIN) 600 MG tablet TAKE 1 TABLET THREE TIMES DAILY (Patient taking differently: TAKE 1 TABLET BY MOUTH EVERY MORNING.) 270 tablet 3  . INSULIN SYRINGE 1CC/29G (B-D INSULIN SYRINGE) 29G X 1/2" 1 ML MISC As dirrected 50 each 3  . loratadine (CLARITIN) 10 MG tablet Take 1 tablet (10 mg total) by mouth daily. As needed for allergies (Patient taking differently: Take 10 mg by mouth daily as needed for allergies. ) 90 tablet 1  . losartan (COZAAR) 100 MG tablet Take 1 tablet (100 mg total) by mouth daily. 90 tablet 3  . meloxicam (MOBIC) 15 MG tablet Take 1 tablet (15 mg total) by mouth daily. 30 tablet 3  . metFORMIN (GLUCOPHAGE) 500 MG tablet Take 1 tablet (500 mg total) by mouth daily with breakfast. 180 tablet 3  . omeprazole (PRILOSEC) 40 MG capsule Take 1 capsule (40 mg total) by mouth 2 (two) times daily. 180 capsule 3  . ondansetron (ZOFRAN ODT) 8 MG disintegrating tablet Take 1 tablet (8 mg total) by mouth every 8 (eight) hours as needed for nausea or vomiting. 20 tablet 0  . Phosphatidylserine-DHA-EPA  (VAYACOG) 100-19.5-6.5 MG CAPS Take 1 capsule by mouth daily. 30 capsule 11  . polyvinyl alcohol (LIQUIFILM TEARS) 1.4 % ophthalmic solution Place 1-2 drops into both eyes daily as needed for dry eyes.    Marland Kitchen senna (SENOKOT) 8.6 MG TABS tablet Take 2 tablets (17.2 mg total) by mouth at bedtime. 60 each 3  . valACYclovir (VALTREX) 500 MG tablet Take 1 tablet (500 mg total) by mouth 2 (two) times daily. 60 tablet 11  . cefUROXime (CEFTIN) 500 MG tablet Take 1 tablet (500 mg total) by mouth 2 (two) times daily. 20 tablet 0   No facility-administered medications prior to visit.    ROS Review of Systems  Constitutional: Positive for fatigue. Negative for chills, activity change, appetite change and unexpected weight change.  HENT: Negative for congestion, mouth sores and sinus pressure.   Eyes: Negative for visual disturbance.  Respiratory: Negative for cough and chest tightness.   Gastrointestinal: Negative for nausea and abdominal pain.  Genitourinary: Negative for frequency, difficulty urinating and vaginal pain.  Musculoskeletal: Negative for back pain and gait problem.  Skin: Negative for pallor and rash.  Neurological: Positive for headaches. Negative for dizziness, tremors, weakness and numbness.  Psychiatric/Behavioral: Positive for sleep disturbance. Negative for confusion.    Objective:  BP 124/70 mmHg  Pulse 77  Wt 191  lb (86.637 kg)  SpO2 97%  BP Readings from Last 3 Encounters:  01/30/16 124/70  01/05/16 124/80  09/12/15 139/84    Wt Readings from Last 3 Encounters:  01/30/16 191 lb (86.637 kg)  01/05/16 190 lb (86.183 kg)  09/12/15 184 lb (83.462 kg)    Physical Exam  Constitutional: She appears well-developed. No distress.  HENT:  Head: Normocephalic.  Right Ear: External ear normal.  Left Ear: External ear normal.  Nose: Nose normal.  Mouth/Throat: Oropharynx is clear and moist.  Eyes: Conjunctivae are normal. Pupils are equal, round, and reactive to light.  Right eye exhibits no discharge. Left eye exhibits no discharge.  Neck: Normal range of motion. Neck supple. No JVD present. No tracheal deviation present. No thyromegaly present.  Cardiovascular: Normal rate, regular rhythm and normal heart sounds.   Pulmonary/Chest: No stridor. No respiratory distress. She has no wheezes.  Abdominal: Soft. Bowel sounds are normal. She exhibits no distension and no mass. There is no tenderness. There is no rebound and no guarding.  Musculoskeletal: She exhibits tenderness. She exhibits no edema.  Lymphadenopathy:    She has no cervical adenopathy.  Neurological: She displays normal reflexes. No cranial nerve deficit. She exhibits normal muscle tone. Coordination normal.  Skin: No rash noted. No erythema.  Psychiatric: She has a normal mood and affect. Her behavior is normal. Judgment and thought content normal.  tender over R rhomboid and post shoulder muscles  Lab Results  Component Value Date   WBC 6.0 09/22/2015   HGB 13.5 09/22/2015   HCT 41.2 09/22/2015   PLT 320.0 09/22/2015   GLUCOSE 131* 09/22/2015   CHOL 212* 09/22/2015   TRIG 124.0 09/22/2015   HDL 43.50 09/22/2015   LDLCALC 144* 09/22/2015   ALT 10 09/22/2015   AST 15 09/22/2015   NA 141 09/22/2015   K 4.3 09/22/2015   CL 107 09/22/2015   CREATININE 1.07 09/22/2015   BUN 23 09/22/2015   CO2 28 09/22/2015   TSH 3.49 09/22/2015   INR 1.02 05/26/2015   HGBA1C 6.1 09/22/2015    Ct Head Wo Contrast  01/08/2016  CLINICAL DATA:  70 year old female with concussion and loss of consciousness and persistent headache. EXAM: CT HEAD WITHOUT CONTRAST TECHNIQUE: Contiguous axial images were obtained from the base of the skull through the vertex without intravenous contrast. COMPARISON:  None. FINDINGS: The ventricles and sulci are appropriate in size for patient's age. Mild periventricular and deep white matter chronic microvascular ischemic changes noted. There is no acute intracranial hemorrhage.  No mass effect or midline shift noted. The visualized paranasal sinuses and mastoid air cells are clear. The calvarium is intact. IMPRESSION: No acute intracranial hemorrhage. Mild age-related atrophy and chronic microvascular ischemic disease. If symptoms persist and there are no contraindications, MRI may provide better evaluation if clinically indicated Electronically Signed   By: Anner Crete M.D.   On: 01/08/2016 14:34    Assessment & Plan:   Diagnoses and all orders for this visit:  Essential hypertension  INSOMNIA  NECK PAIN  Concussion with loss of consciousness, 30 minutes or less, initial encounter  Depression  Chronic fatigue  Other orders -     traZODone (DESYREL) 50 MG tablet; Take 0.5-1 tablets (25-50 mg total) by mouth at bedtime as needed for sleep.  I have discontinued Ms. Oshields's cefUROXime. I am also having her start on traZODone. Additionally, I am having her maintain her INSULIN SYRINGE 1CC/29G, loratadine, gabapentin, acetaminophen, amLODipine, polyvinyl alcohol, docusate sodium, ondansetron,  senna, aspirin EC, DULoxetine, meloxicam, metFORMIN, omeprazole, valACYclovir, losartan, and Phosphatidylserine-DHA-EPA.  Meds ordered this encounter  Medications  . traZODone (DESYREL) 50 MG tablet    Sig: Take 0.5-1 tablets (25-50 mg total) by mouth at bedtime as needed for sleep.    Dispense:  30 tablet    Refill:  5     Follow-up: Return in about 3 months (around 05/01/2016) for a follow-up visit.  Walker Kehr, MD

## 2016-01-30 NOTE — Assessment & Plan Note (Signed)
On Cymbalta Trazodone added

## 2016-01-30 NOTE — Assessment & Plan Note (Signed)
Worse after LOC 2/17 Better now

## 2016-01-30 NOTE — Assessment & Plan Note (Signed)
Post-concussion HA Trazodone at hs

## 2016-01-30 NOTE — Assessment & Plan Note (Signed)
On Losartan, Norvasc 

## 2016-02-16 ENCOUNTER — Telehealth: Payer: Self-pay | Admitting: Internal Medicine

## 2016-02-16 DIAGNOSIS — M25519 Pain in unspecified shoulder: Secondary | ICD-10-CM

## 2016-02-16 NOTE — Telephone Encounter (Signed)
OK - would she like to see Dr Lyla Glassing or Dr Siri Cole? Thx

## 2016-02-16 NOTE — Telephone Encounter (Signed)
Patient states she is still having pain in her arm and shoulder.  States it is really hard to sleep at night.  Patient would like to know if she needs to be referred anywhere to find out what is wrong with her arm.

## 2016-02-19 NOTE — Telephone Encounter (Signed)
Spoke to pt and she stated that she does not have a preference. Pt feels like it is more like nerve pain and is not sure who/what type of specialist would help with that.

## 2016-02-19 NOTE — Telephone Encounter (Signed)
Ok Thx 

## 2016-02-20 NOTE — Telephone Encounter (Signed)
Patient is requesting to be referred to Dr. Remo Lipps Pill at [facility] Jule Ser (orthocarolina).

## 2016-03-04 NOTE — Telephone Encounter (Signed)
Called pt to see which shoulder that is bothering her. Awaiting call back

## 2016-03-18 DIAGNOSIS — M7581 Other shoulder lesions, right shoulder: Secondary | ICD-10-CM | POA: Diagnosis not present

## 2016-03-18 DIAGNOSIS — M25511 Pain in right shoulder: Secondary | ICD-10-CM | POA: Diagnosis not present

## 2016-04-30 DIAGNOSIS — C44329 Squamous cell carcinoma of skin of other parts of face: Secondary | ICD-10-CM | POA: Diagnosis not present

## 2016-04-30 DIAGNOSIS — L570 Actinic keratosis: Secondary | ICD-10-CM | POA: Diagnosis not present

## 2016-04-30 DIAGNOSIS — D485 Neoplasm of uncertain behavior of skin: Secondary | ICD-10-CM | POA: Diagnosis not present

## 2016-04-30 DIAGNOSIS — C44119 Basal cell carcinoma of skin of left eyelid, including canthus: Secondary | ICD-10-CM | POA: Diagnosis not present

## 2016-05-03 ENCOUNTER — Ambulatory Visit: Payer: Commercial Managed Care - HMO | Admitting: Internal Medicine

## 2016-05-06 ENCOUNTER — Ambulatory Visit: Payer: Commercial Managed Care - HMO | Admitting: Internal Medicine

## 2016-05-14 ENCOUNTER — Ambulatory Visit (INDEPENDENT_AMBULATORY_CARE_PROVIDER_SITE_OTHER): Payer: Commercial Managed Care - HMO | Admitting: Internal Medicine

## 2016-05-14 ENCOUNTER — Encounter: Payer: Self-pay | Admitting: Internal Medicine

## 2016-05-14 ENCOUNTER — Other Ambulatory Visit (INDEPENDENT_AMBULATORY_CARE_PROVIDER_SITE_OTHER): Payer: Commercial Managed Care - HMO

## 2016-05-14 VITALS — BP 130/80 | HR 77 | Wt 185.0 lb

## 2016-05-14 DIAGNOSIS — E119 Type 2 diabetes mellitus without complications: Secondary | ICD-10-CM

## 2016-05-14 DIAGNOSIS — N3281 Overactive bladder: Secondary | ICD-10-CM

## 2016-05-14 DIAGNOSIS — I10 Essential (primary) hypertension: Secondary | ICD-10-CM

## 2016-05-14 DIAGNOSIS — E538 Deficiency of other specified B group vitamins: Secondary | ICD-10-CM

## 2016-05-14 DIAGNOSIS — R634 Abnormal weight loss: Secondary | ICD-10-CM

## 2016-05-14 LAB — BASIC METABOLIC PANEL
BUN: 23 mg/dL (ref 6–23)
CALCIUM: 9.7 mg/dL (ref 8.4–10.5)
CHLORIDE: 106 meq/L (ref 96–112)
CO2: 29 mEq/L (ref 19–32)
CREATININE: 1.26 mg/dL — AB (ref 0.40–1.20)
GFR: 44.32 mL/min — AB (ref >60.00)
GLUCOSE: 99 mg/dL (ref 70–99)
Potassium: 4.2 mEq/L (ref 3.5–5.1)
Sodium: 141 mEq/L (ref 135–145)

## 2016-05-14 LAB — HEPATIC FUNCTION PANEL
ALBUMIN: 4.3 g/dL (ref 3.5–5.2)
ALT: 14 U/L (ref 0–35)
AST: 19 U/L (ref 0–37)
Alkaline Phosphatase: 82 U/L (ref 39–117)
Bilirubin, Direct: 0 mg/dL (ref 0.0–0.3)
TOTAL PROTEIN: 7.2 g/dL (ref 6.0–8.3)
Total Bilirubin: 0.6 mg/dL (ref 0.2–1.2)

## 2016-05-14 LAB — URINALYSIS
Bilirubin Urine: NEGATIVE
KETONES UR: NEGATIVE
Leukocytes, UA: NEGATIVE
Nitrite: NEGATIVE
Specific Gravity, Urine: 1.025 (ref 1.000–1.030)
URINE GLUCOSE: NEGATIVE
UROBILINOGEN UA: 1 (ref 0.0–1.0)
pH: 6 (ref 5.0–8.0)

## 2016-05-14 LAB — TSH: TSH: 4.06 u[IU]/mL (ref 0.35–4.50)

## 2016-05-14 LAB — HEMOGLOBIN A1C: HEMOGLOBIN A1C: 6 % (ref 4.6–6.5)

## 2016-05-14 LAB — CK: CK TOTAL: 83 U/L (ref 7–177)

## 2016-05-14 MED ORDER — METFORMIN HCL 500 MG PO TABS: 500.0000 mg | ORAL_TABLET | Freq: Every day | ORAL | Status: DC

## 2016-05-14 MED ORDER — AMOXICILLIN 500 MG PO CAPS: ORAL_CAPSULE | ORAL | Status: DC

## 2016-05-14 NOTE — Assessment & Plan Note (Signed)
On B12 

## 2016-05-14 NOTE — Progress Notes (Signed)
Pre visit review using our clinic review tool, if applicable. No additional management support is needed unless otherwise documented below in the visit note. 

## 2016-05-14 NOTE — Assessment & Plan Note (Signed)
On Losartan, Norvasc 

## 2016-05-14 NOTE — Assessment & Plan Note (Signed)
Labs

## 2016-05-14 NOTE — Progress Notes (Signed)
Subjective:  Patient ID: Claudia [name], female    DOB: [birth year]  Age: 70 y.o. MRN: [id number]  CC: No chief complaint on file.   HPI Kenzi B Stoermer presents for urinary frequency, urgency C/o muscle cramps Pt fell off the hammock 3 wks ago  Outpatient Prescriptions Prior to Visit  Medication Sig Dispense Refill  . acetaminophen (TYLENOL) 500 MG tablet Take 500 mg by mouth every 6 (six) hours as needed.    Marland Kitchen amLODipine (NORVASC) 2.5 MG tablet Take 1 tablet (2.5 mg total) by mouth daily. 90 tablet 3  . aspirin EC 325 MG tablet Take 1 tablet (325 mg total) by mouth 2 (two) times daily after a meal. 60 tablet 0  . docusate sodium (COLACE) 100 MG capsule Take 1 capsule (100 mg total) by mouth 2 (two) times daily. 60 capsule 3  . DULoxetine (CYMBALTA) 60 MG capsule Take 1 capsule (60 mg total) by mouth daily. For depression 90 capsule 3  . gabapentin (NEURONTIN) 600 MG tablet TAKE 1 TABLET THREE TIMES DAILY (Patient taking differently: TAKE 1 TABLET BY MOUTH EVERY MORNING.) 270 tablet 3  . INSULIN SYRINGE 1CC/29G (B-D INSULIN SYRINGE) 29G X 1/2" 1 ML MISC As dirrected 50 each 3  . loratadine (CLARITIN) 10 MG tablet Take 1 tablet (10 mg total) by mouth daily. As needed for allergies (Patient taking differently: Take 10 mg by mouth daily as needed for allergies. ) 90 tablet 1  . losartan (COZAAR) 100 MG tablet Take 1 tablet (100 mg total) by mouth daily. 90 tablet 3  . meloxicam (MOBIC) 15 MG tablet Take 1 tablet (15 mg total) by mouth daily. 30 tablet 3  . metFORMIN (GLUCOPHAGE) 500 MG tablet Take 1 tablet (500 mg total) by mouth daily with breakfast. 180 tablet 3  . omeprazole (PRILOSEC) 40 MG capsule Take 1 capsule (40 mg total) by mouth 2 (two) times daily. 180 capsule 3  . ondansetron (ZOFRAN ODT) 8 MG disintegrating tablet Take 1 tablet (8 mg total) by mouth every 8 (eight) hours as needed for nausea or vomiting. 20 tablet 0  . Phosphatidylserine-DHA-EPA (VAYACOG) 100-19.5-6.5 MG CAPS  Take 1 capsule by mouth daily. 30 capsule 11  . polyvinyl alcohol (LIQUIFILM TEARS) 1.4 % ophthalmic solution Place 1-2 drops into both eyes daily as needed for dry eyes.    Marland Kitchen senna (SENOKOT) 8.6 MG TABS tablet Take 2 tablets (17.2 mg total) by mouth at bedtime. 60 each 3  . traZODone (DESYREL) 50 MG tablet Take 0.5-1 tablets (25-50 mg total) by mouth at bedtime as needed for sleep. 30 tablet 5  . valACYclovir (VALTREX) 500 MG tablet Take 1 tablet (500 mg total) by mouth 2 (two) times daily. 60 tablet 11   No facility-administered medications prior to visit.    ROS Review of Systems  Constitutional: Positive for fatigue. Negative for chills, activity change, appetite change and unexpected weight change.  HENT: Negative for congestion, mouth sores and sinus pressure.   Eyes: Negative for visual disturbance.  Respiratory: Negative for cough and chest tightness.   Gastrointestinal: Negative for nausea and abdominal pain.  Genitourinary: Positive for urgency and frequency. Negative for difficulty urinating and vaginal pain.  Musculoskeletal: Positive for myalgias, back pain and arthralgias. Negative for gait problem.  Skin: Negative for pallor and rash.  Neurological: Negative for dizziness, tremors, weakness, numbness and headaches.  Psychiatric/Behavioral: Negative for confusion and sleep disturbance.    Objective:  BP 130/80 mmHg  Pulse 77  Wt 185  lb (83.915 kg)  SpO2 96%  BP Readings from Last 3 Encounters:  05/14/16 130/80  01/30/16 124/70  01/05/16 124/80    Wt Readings from Last 3 Encounters:  05/14/16 185 lb (83.915 kg)  01/30/16 191 lb (86.637 kg)  01/05/16 190 lb (86.183 kg)    Physical Exam  Constitutional: She appears well-developed. No distress.  HENT:  Head: Normocephalic.  Right Ear: External ear normal.  Left Ear: External ear normal.  Nose: Nose normal.  Mouth/Throat: Oropharynx is clear and moist.  Eyes: Conjunctivae are normal. Pupils are equal, round,  and reactive to light. Right eye exhibits no discharge. Left eye exhibits no discharge.  Neck: Normal range of motion. Neck supple. No JVD present. No tracheal deviation present. No thyromegaly present.  Cardiovascular: Normal rate, regular rhythm and normal heart sounds.   Pulmonary/Chest: No stridor. No respiratory distress. She has no wheezes.  Abdominal: Soft. Bowel sounds are normal. She exhibits no distension and no mass. There is no tenderness. There is no rebound and no guarding.  Musculoskeletal: She exhibits no edema or tenderness.  Lymphadenopathy:    She has no cervical adenopathy.  Neurological: She displays normal reflexes. No cranial nerve deficit. She exhibits normal muscle tone. Coordination normal.  Skin: No rash noted. No erythema.  Psychiatric: She has a normal mood and affect. Her behavior is normal. Judgment and thought content normal.    Lab Results  Component Value Date   WBC 6.0 09/22/2015   HGB 13.5 09/22/2015   HCT 41.2 09/22/2015   PLT 320.0 09/22/2015   GLUCOSE 131* 09/22/2015   CHOL 212* 09/22/2015   TRIG 124.0 09/22/2015   HDL 43.50 09/22/2015   LDLCALC 144* 09/22/2015   ALT 10 09/22/2015   AST 15 09/22/2015   NA 141 09/22/2015   K 4.3 09/22/2015   CL 107 09/22/2015   CREATININE 1.07 09/22/2015   BUN 23 09/22/2015   CO2 28 09/22/2015   TSH 3.49 09/22/2015   INR 1.02 05/26/2015   HGBA1C 6.1 09/22/2015    Ct Head Wo Contrast  01/08/2016  CLINICAL DATA:  70 year old female with concussion and loss of consciousness and persistent headache. EXAM: CT HEAD WITHOUT CONTRAST TECHNIQUE: Contiguous axial images were obtained from the base of the skull through the vertex without intravenous contrast. COMPARISON:  None. FINDINGS: The ventricles and sulci are appropriate in size for patient's age. Mild periventricular and deep white matter chronic microvascular ischemic changes noted. There is no acute intracranial hemorrhage. No mass effect or midline shift  noted. The visualized paranasal sinuses and mastoid air cells are clear. The calvarium is intact. IMPRESSION: No acute intracranial hemorrhage. Mild age-related atrophy and chronic microvascular ischemic disease. If symptoms persist and there are no contraindications, MRI may provide better evaluation if clinically indicated Electronically Signed   By: Anner Crete M.D.   On: 01/08/2016 14:34    Assessment & Plan:   There are no diagnoses linked to this encounter. I am having Ms. Turbin maintain her INSULIN SYRINGE 1CC/29G, loratadine, gabapentin, acetaminophen, amLODipine, polyvinyl alcohol, docusate sodium, ondansetron, senna, aspirin EC, DULoxetine, meloxicam, metFORMIN, omeprazole, valACYclovir, losartan, Phosphatidylserine-DHA-EPA, and traZODone.  No orders of the defined types were placed in this encounter.     Follow-up: No Follow-up on file.  Walker Kehr, MD

## 2016-05-14 NOTE — Assessment & Plan Note (Signed)
On Metformin Labs 

## 2016-05-14 NOTE — Assessment & Plan Note (Signed)
Chronic - worse Rx options/Urol ref discussed

## 2016-05-16 ENCOUNTER — Telehealth: Payer: Self-pay

## 2016-05-16 NOTE — Telephone Encounter (Signed)
Patient called about labs. I informed her of all labs. She understood. Thank you.

## 2016-05-28 DIAGNOSIS — C44329 Squamous cell carcinoma of skin of other parts of face: Secondary | ICD-10-CM | POA: Diagnosis not present

## 2016-06-07 ENCOUNTER — Other Ambulatory Visit: Payer: Self-pay | Admitting: Internal Medicine

## 2016-08-13 DIAGNOSIS — C44119 Basal cell carcinoma of skin of left eyelid, including canthus: Secondary | ICD-10-CM | POA: Diagnosis not present

## 2016-08-22 ENCOUNTER — Ambulatory Visit: Payer: Commercial Managed Care - HMO | Admitting: Internal Medicine

## 2016-08-30 ENCOUNTER — Telehealth: Payer: Self-pay | Admitting: Internal Medicine

## 2016-08-30 DIAGNOSIS — Z1211 Encounter for screening for malignant neoplasm of colon: Secondary | ICD-10-CM

## 2016-08-30 NOTE — Telephone Encounter (Signed)
Pt called in and would like a referral for a colonscopy

## 2016-09-08 NOTE — Telephone Encounter (Signed)
OK. Thx

## 2016-09-12 ENCOUNTER — Other Ambulatory Visit (INDEPENDENT_AMBULATORY_CARE_PROVIDER_SITE_OTHER): Payer: Commercial Managed Care - HMO

## 2016-09-12 ENCOUNTER — Encounter: Payer: Self-pay | Admitting: Internal Medicine

## 2016-09-12 ENCOUNTER — Ambulatory Visit (INDEPENDENT_AMBULATORY_CARE_PROVIDER_SITE_OTHER): Payer: Commercial Managed Care - HMO | Admitting: Internal Medicine

## 2016-09-12 DIAGNOSIS — K580 Irritable bowel syndrome with diarrhea: Secondary | ICD-10-CM | POA: Diagnosis not present

## 2016-09-12 DIAGNOSIS — I10 Essential (primary) hypertension: Secondary | ICD-10-CM

## 2016-09-12 DIAGNOSIS — R6889 Other general symptoms and signs: Secondary | ICD-10-CM

## 2016-09-12 DIAGNOSIS — R109 Unspecified abdominal pain: Secondary | ICD-10-CM

## 2016-09-12 LAB — HEPATIC FUNCTION PANEL
ALBUMIN: 4.2 g/dL (ref 3.5–5.2)
ALT: 12 U/L (ref 0–35)
AST: 18 U/L (ref 0–37)
Alkaline Phosphatase: 79 U/L (ref 39–117)
Bilirubin, Direct: 0.1 mg/dL (ref 0.0–0.3)
TOTAL PROTEIN: 6.9 g/dL (ref 6.0–8.3)
Total Bilirubin: 0.6 mg/dL (ref 0.2–1.2)

## 2016-09-12 LAB — CBC WITH DIFFERENTIAL/PLATELET
BASOS ABS: 0 10*3/uL (ref 0.0–0.1)
BASOS PCT: 0.2 % (ref 0.0–3.0)
EOS ABS: 0.2 10*3/uL (ref 0.0–0.7)
Eosinophils Relative: 2.4 % (ref 0.0–5.0)
HEMATOCRIT: 41.8 % (ref 36.0–46.0)
HEMOGLOBIN: 14.1 g/dL (ref 12.0–15.0)
LYMPHS PCT: 31.1 % (ref 12.0–46.0)
Lymphs Abs: 2 10*3/uL (ref 0.7–4.0)
MCHC: 33.6 g/dL (ref 30.0–36.0)
MCV: 88.6 fl (ref 78.0–100.0)
MONOS PCT: 5.3 % (ref 3.0–12.0)
Monocytes Absolute: 0.3 10*3/uL (ref 0.1–1.0)
Neutro Abs: 4 10*3/uL (ref 1.4–7.7)
Neutrophils Relative %: 61 % (ref 43.0–77.0)
Platelets: 290 10*3/uL (ref 150.0–400.0)
RBC: 4.72 Mil/uL (ref 3.87–5.11)
RDW: 13.8 % (ref 11.5–15.5)
WBC: 6.6 10*3/uL (ref 4.0–10.5)

## 2016-09-12 LAB — BASIC METABOLIC PANEL
BUN: 18 mg/dL (ref 6–23)
CHLORIDE: 107 meq/L (ref 96–112)
CO2: 30 meq/L (ref 19–32)
Calcium: 9.6 mg/dL (ref 8.4–10.5)
Creatinine, Ser: 1.05 mg/dL (ref 0.40–1.20)
GFR: 54.65 mL/min — ABNORMAL LOW (ref >60.00)
GLUCOSE: 90 mg/dL (ref 70–99)
POTASSIUM: 5 meq/L (ref 3.5–5.1)
Sodium: 142 mEq/L (ref 135–145)

## 2016-09-12 LAB — URINALYSIS
BILIRUBIN URINE: NEGATIVE
HGB URINE DIPSTICK: NEGATIVE
Ketones, ur: NEGATIVE
LEUKOCYTES UA: NEGATIVE
NITRITE: NEGATIVE
Specific Gravity, Urine: 1.015 (ref 1.000–1.030)
Total Protein, Urine: NEGATIVE
Urine Glucose: NEGATIVE
Urobilinogen, UA: 0.2 (ref 0.0–1.0)
pH: 7 (ref 5.0–8.0)

## 2016-09-12 LAB — SEDIMENTATION RATE: Sed Rate: 10 mm/hr (ref 0–30)

## 2016-09-12 MED ORDER — HYOSCYAMINE SULFATE 0.125 MG PO TABS: 0.1250 mg | ORAL_TABLET | ORAL | 1 refills | Status: DC | PRN

## 2016-09-12 MED ORDER — DULOXETINE HCL 60 MG PO CPEP: 60.0000 mg | ORAL_CAPSULE | Freq: Every day | ORAL | 3 refills | Status: DC

## 2016-09-12 MED ORDER — LOSARTAN POTASSIUM 100 MG PO TABS: 100.0000 mg | ORAL_TABLET | Freq: Every day | ORAL | 3 refills | Status: DC

## 2016-09-12 NOTE — Patient Instructions (Signed)
Go to ER if worse 

## 2016-09-12 NOTE — Assessment & Plan Note (Signed)
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 The pain would last x 1 h or less q 1-2 weeks x 1-2 years... ?adhesions vs other Levsin prn Labs Abd CT w/contrast

## 2016-09-12 NOTE — Assessment & Plan Note (Signed)
Levsin prn

## 2016-09-12 NOTE — Assessment & Plan Note (Signed)
Labs

## 2016-09-12 NOTE — Progress Notes (Signed)
Pre visit review using our clinic review tool, if applicable. No additional management support is needed unless otherwise documented below in the visit note. 

## 2016-09-12 NOTE — Assessment & Plan Note (Signed)
Vayacog 

## 2016-09-12 NOTE — Progress Notes (Signed)
Subjective:  Patient ID: Claudia [name], female    DOB: 09-14-1946  Age: 70 y.o. MRN: [id number]  CC: No chief complaint on file.   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 The pain would last x 1 h or less q 1-2 weeks x 1-2 years...  C/o R shoulder pain F/u memory loss  Outpatient Medications Prior to Visit  Medication Sig Dispense Refill  . amLODipine (NORVASC) 2.5 MG tablet TAKE 1 TABLET DAILY 90 tablet 3  . amoxicillin (AMOXIL) 500 MG capsule 2000 mg po 1 hr prior to procedure 20 capsule 1  . DULoxetine (CYMBALTA) 60 MG capsule Take 1 capsule (60 mg total) by mouth daily. For depression 90 capsule 3  . gabapentin (NEURONTIN) 600 MG tablet TAKE 1 TABLET THREE TIMES DAILY 270 tablet 3  . INSULIN SYRINGE 1CC/29G (B-D INSULIN SYRINGE) 29G X 1/2" 1 ML MISC As dirrected 50 each 3  . loratadine (CLARITIN) 10 MG tablet Take 1 tablet (10 mg total) by mouth daily. As needed for allergies (Patient taking differently: Take 10 mg by mouth daily as needed for allergies. ) 90 tablet 1  . losartan (COZAAR) 100 MG tablet Take 1 tablet (100 mg total) by mouth daily. 90 tablet 3  . meloxicam (MOBIC) 15 MG tablet TAKE 1 TABLET EVERY DAY 90 tablet 3  . metFORMIN (GLUCOPHAGE) 500 MG tablet Take 1 tablet (500 mg total) by mouth daily with breakfast. 180 tablet 3  . omeprazole (PRILOSEC) 40 MG capsule TAKE 1 CAPSULE TWICE DAILY 180 capsule 3  . Phosphatidylserine-DHA-EPA (VAYACOG) 100-19.5-6.5 MG CAPS Take 1 capsule by mouth daily. 30 capsule 11  . polyvinyl alcohol (LIQUIFILM TEARS) 1.4 % ophthalmic solution Place 1-2 drops into both eyes daily as needed for dry eyes.     No facility-administered medications prior to visit.     ROS Review of Systems  Constitutional: Negative for activity change, appetite change, chills, fatigue and unexpected weight change.  HENT: Negative for  congestion, mouth sores and sinus pressure.   Eyes: Negative for visual disturbance.  Respiratory: Negative for cough and chest tightness.   Gastrointestinal: Positive for abdominal distention and abdominal pain. Negative for constipation, nausea, rectal pain and vomiting.  Genitourinary: Negative for difficulty urinating, frequency and vaginal pain.  Musculoskeletal: Positive for arthralgias. Negative for back pain and gait problem.  Skin: Negative for pallor and rash.  Neurological: Negative for dizziness, tremors, weakness, numbness and headaches.  Psychiatric/Behavioral: Negative for confusion and sleep disturbance.    Objective:  BP 110/70   Pulse 64   Wt 187 lb (84.8 kg)   SpO2 97%   BMI 29.29 kg/m   BP Readings from Last 3 Encounters:  09/12/16 110/70  05/14/16 130/80  01/30/16 124/70    Wt Readings from Last 3 Encounters:  09/12/16 187 lb (84.8 kg)  05/14/16 185 lb (83.9 kg)  01/30/16 191 lb (86.6 kg)    Physical Exam  Constitutional: She appears well-developed. No distress.  HENT:  Head: Normocephalic.  Right Ear: External ear normal.  Left Ear: External ear normal.  Nose: Nose normal.  Mouth/Throat: Oropharynx is clear and moist.  Eyes: Conjunctivae are normal. Pupils are equal, round, and reactive to light. Right eye exhibits no discharge. Left eye exhibits no discharge.  Neck: Normal range of motion. Neck supple. No JVD present. No tracheal deviation present. No thyromegaly present.  Cardiovascular: Normal rate, regular rhythm and normal heart sounds.   Pulmonary/Chest: No stridor. No respiratory distress. She has no wheezes.  Abdominal: Soft. Bowel sounds are normal. She exhibits no distension and no mass. There is tenderness. There is no rebound and no guarding.  Musculoskeletal: She exhibits tenderness. She exhibits no edema.  Lymphadenopathy:    She has no cervical adenopathy.  Neurological: She displays normal reflexes. No cranial nerve deficit. She  exhibits normal muscle tone. Coordination normal.  Skin: No rash noted. No erythema.  Psychiatric: She has a normal mood and affect. Her behavior is normal. Judgment and thought content normal.  Abd is soft, sensitive in RLQ   Lab Results  Component Value Date   WBC 6.0 09/22/2015   HGB 13.5 09/22/2015   HCT 41.2 09/22/2015   PLT 320.0 09/22/2015   GLUCOSE 99 05/14/2016   CHOL 212 (H) 09/22/2015   TRIG 124.0 09/22/2015   HDL 43.50 09/22/2015   LDLCALC 144 (H) 09/22/2015   ALT 14 05/14/2016   AST 19 05/14/2016   NA 141 05/14/2016   K 4.2 05/14/2016   CL 106 05/14/2016   CREATININE 1.26 (H) 05/14/2016   BUN 23 05/14/2016   CO2 29 05/14/2016   TSH 4.06 05/14/2016   INR 1.02 05/26/2015   HGBA1C 6.0 05/14/2016    Ct Head Wo Contrast  Result Date: 01/08/2016 CLINICAL DATA:  70 year old female with concussion and loss of consciousness and persistent headache. EXAM: CT HEAD WITHOUT CONTRAST TECHNIQUE: Contiguous axial images were obtained from the base of the skull through the vertex without intravenous contrast. COMPARISON:  None. FINDINGS: The ventricles and sulci are appropriate in size for patient's age. Mild periventricular and deep white matter chronic microvascular ischemic changes noted. There is no acute intracranial hemorrhage. No mass effect or midline shift noted. The visualized paranasal sinuses and mastoid air cells are clear. The calvarium is intact. IMPRESSION: No acute intracranial hemorrhage. Mild age-related atrophy and chronic microvascular ischemic disease. If symptoms persist and there are no contraindications, MRI may provide better evaluation if clinically indicated Electronically Signed   By: Anner Crete M.D.   On: 01/08/2016 14:34    Assessment & Plan:   There are no diagnoses linked to this encounter. I am having Ms. Coulston maintain her INSULIN SYRINGE 1CC/29G, loratadine, polyvinyl alcohol, DULoxetine, losartan, Phosphatidylserine-DHA-EPA, amoxicillin,  metFORMIN, meloxicam, amLODipine, omeprazole, gabapentin, and cholecalciferol.  Meds ordered this encounter  Medications  . cholecalciferol (VITAMIN D) 1000 units tablet    Sig: Take 1 tablet by mouth daily.     Follow-up: No Follow-up on file.  Walker Kehr, MD

## 2016-09-19 ENCOUNTER — Telehealth: Payer: Self-pay | Admitting: Internal Medicine

## 2016-09-19 NOTE — Telephone Encounter (Signed)
Ct scheduled

## 2016-09-19 NOTE — Telephone Encounter (Signed)
Cecille Rubin, I would do it now if ok with the pt. Thanks, AP

## 2016-09-19 NOTE — Telephone Encounter (Signed)
I spoke to pt regarding her CT scan and she thought you wanted to wait till after you see her again before having CT done. She hasn't had another episode since she last seen you.  Do you want her to have this done before or after her next appt. She states she's fine either way.  Please advise

## 2016-09-26 ENCOUNTER — Ambulatory Visit (INDEPENDENT_AMBULATORY_CARE_PROVIDER_SITE_OTHER)
Admission: RE | Admit: 2016-09-26 | Discharge: 2016-09-26 | Disposition: A | Discharge status: Home / Self Care | Payer: Commercial Managed Care - HMO | Source: Ambulatory Visit | Attending: Internal Medicine | Admitting: Internal Medicine

## 2016-09-26 DIAGNOSIS — R109 Unspecified abdominal pain: Secondary | ICD-10-CM

## 2016-09-26 MED ORDER — IOPAMIDOL (ISOVUE-300) INJECTION 61%
100.0000 mL | Freq: Once | INTRAVENOUS | Status: AC | PRN
  Administered 2016-09-26: 100 mL via INTRAVENOUS

## 2016-09-27 ENCOUNTER — Other Ambulatory Visit: Payer: Commercial Managed Care - HMO

## 2016-10-02 ENCOUNTER — Ambulatory Visit (INDEPENDENT_AMBULATORY_CARE_PROVIDER_SITE_OTHER): Payer: Commercial Managed Care - HMO | Admitting: Internal Medicine

## 2016-10-02 ENCOUNTER — Encounter: Payer: Self-pay | Admitting: Internal Medicine

## 2016-10-02 DIAGNOSIS — F411 Generalized anxiety disorder: Secondary | ICD-10-CM

## 2016-10-02 DIAGNOSIS — E119 Type 2 diabetes mellitus without complications: Secondary | ICD-10-CM | POA: Diagnosis not present

## 2016-10-02 DIAGNOSIS — R109 Unspecified abdominal pain: Secondary | ICD-10-CM | POA: Diagnosis not present

## 2016-10-02 DIAGNOSIS — I10 Essential (primary) hypertension: Secondary | ICD-10-CM

## 2016-10-02 MED ORDER — HYOSCYAMINE SULFATE 0.125 MG PO TABS: 0.1250 mg | ORAL_TABLET | ORAL | 1 refills | Status: DC | PRN

## 2016-10-02 NOTE — Assessment & Plan Note (Signed)
Losartan  Norvasc 

## 2016-10-02 NOTE — Assessment & Plan Note (Signed)
Hold Metformin x 1 week 

## 2016-10-02 NOTE — Patient Instructions (Signed)
  Hold Metformin x 1 week Start Levsin

## 2016-10-02 NOTE — Assessment & Plan Note (Signed)
On Duloxetine  Discussed

## 2016-10-02 NOTE — Progress Notes (Signed)
Pre visit review using our clinic review tool, if applicable. No additional management support is needed unless otherwise documented below in the visit note. 

## 2016-10-02 NOTE — Assessment & Plan Note (Addendum)
IBS vs colitis vs meds side effects Hold Metformin x 1 week Start Levsin GI referral

## 2016-10-02 NOTE — Progress Notes (Signed)
Subjective:  Patient ID: Claudia [name], female    DOB: 1946-01-17  Age: 70 y.o. MRN: [id number]  CC: No chief complaint on file.   HPI Claudia [name] presents for abd pain - not better. C/o aches and pains. F/u anxiety  Outpatient Medications Prior to Visit  Medication Sig Dispense Refill  . amLODipine (NORVASC) 2.5 MG tablet TAKE 1 TABLET DAILY 90 tablet 3  . amoxicillin (AMOXIL) 500 MG capsule 2000 mg po 1 hr prior to procedure 20 capsule 1  . cholecalciferol (VITAMIN D) 1000 units tablet Take 1 tablet by mouth daily.    . DULoxetine (CYMBALTA) 60 MG capsule Take 1 capsule (60 mg total) by mouth daily. For depression 90 capsule 3  . gabapentin (NEURONTIN) 600 MG tablet TAKE 1 TABLET THREE TIMES DAILY 270 tablet 3  . INSULIN SYRINGE 1CC/29G (B-D INSULIN SYRINGE) 29G X 1/2" 1 ML MISC As dirrected 50 each 3  . loratadine (CLARITIN) 10 MG tablet Take 1 tablet (10 mg total) by mouth daily. As needed for allergies (Patient taking differently: Take 10 mg by mouth daily as needed for allergies. ) 90 tablet 1  . losartan (COZAAR) 100 MG tablet Take 1 tablet (100 mg total) by mouth daily. 90 tablet 3  . meloxicam (MOBIC) 15 MG tablet TAKE 1 TABLET EVERY DAY 90 tablet 3  . metFORMIN (GLUCOPHAGE) 500 MG tablet Take 1 tablet (500 mg total) by mouth daily with breakfast. 180 tablet 3  . omeprazole (PRILOSEC) 40 MG capsule TAKE 1 CAPSULE TWICE DAILY 180 capsule 3  . Phosphatidylserine-DHA-EPA (VAYACOG) 100-19.5-6.5 MG CAPS Take 1 capsule by mouth daily. 30 capsule 11  . polyvinyl alcohol (LIQUIFILM TEARS) 1.4 % ophthalmic solution Place 1-2 drops into both eyes daily as needed for dry eyes.    . hyoscyamine (LEVSIN, ANASPAZ) 0.125 MG tablet Take 1-2 tablets (0.125-0.25 mg total) by mouth every 4 (four) hours as needed for cramping. 100 tablet 1   No facility-administered medications prior to visit.     ROS Review of Systems  Constitutional: Negative for activity change, appetite change,  chills, fatigue and unexpected weight change.  HENT: Negative for congestion, mouth sores and sinus pressure.   Eyes: Negative for visual disturbance.  Respiratory: Negative for cough, chest tightness and shortness of breath.   Gastrointestinal: Positive for abdominal distention, abdominal pain and diarrhea. Negative for nausea and vomiting.  Genitourinary: Negative for difficulty urinating, frequency and vaginal pain.  Musculoskeletal: Negative for back pain and gait problem.  Skin: Negative for pallor and rash.  Neurological: Negative for dizziness, tremors, weakness, numbness and headaches.  Psychiatric/Behavioral: Negative for confusion, sleep disturbance and suicidal ideas. The patient is nervous/anxious.     Objective:  BP 118/70   Pulse 79   Wt 186 lb (84.4 kg)   SpO2 97%   BMI 29.13 kg/m   BP Readings from Last 3 Encounters:  10/02/16 118/70  09/12/16 110/70  05/14/16 130/80    Wt Readings from Last 3 Encounters:  10/02/16 186 lb (84.4 kg)  09/12/16 187 lb (84.8 kg)  05/14/16 185 lb (83.9 kg)    Physical Exam  Constitutional: She appears well-developed. No distress.  HENT:  Head: Normocephalic.  Right Ear: External ear normal.  Left Ear: External ear normal.  Nose: Nose normal.  Mouth/Throat: Oropharynx is clear and moist.  Eyes: Conjunctivae are normal. Pupils are equal, round, and reactive to light. Right eye exhibits no discharge. Left eye exhibits no discharge.  Neck: Normal range of  motion. Neck supple. No JVD present. No tracheal deviation present. No thyromegaly present.  Cardiovascular: Normal rate, regular rhythm and normal heart sounds.   Pulmonary/Chest: No stridor. No respiratory distress. She has no wheezes.  Abdominal: Soft. Bowel sounds are normal. She exhibits no distension and no mass. There is tenderness. There is no rebound and no guarding.  Musculoskeletal: She exhibits no edema or tenderness.  Lymphadenopathy:    She has no cervical  adenopathy.  Neurological: She displays normal reflexes. No cranial nerve deficit. She exhibits normal muscle tone. Coordination normal.  Skin: No rash noted. No erythema.  Psychiatric: Her behavior is normal. Judgment and thought content normal.  abd is sensitive in RLQ; soft, no mass depressed  Lab Results  Component Value Date   WBC 6.6 09/12/2016   HGB 14.1 09/12/2016   HCT 41.8 09/12/2016   PLT 290.0 09/12/2016   GLUCOSE 90 09/12/2016   CHOL 212 (H) 09/22/2015   TRIG 124.0 09/22/2015   HDL 43.50 09/22/2015   LDLCALC 144 (H) 09/22/2015   ALT 12 09/12/2016   AST 18 09/12/2016   NA 142 09/12/2016   K 5.0 09/12/2016   CL 107 09/12/2016   CREATININE 1.05 09/12/2016   BUN 18 09/12/2016   CO2 30 09/12/2016   TSH 4.06 05/14/2016   INR 1.02 05/26/2015   HGBA1C 6.0 05/14/2016    Ct Abdomen Pelvis W Contrast  Result Date: 09/26/2016 CLINICAL DATA:  Severe suprapubic abdominal pain for 1 year, worse recently, intermittent diarrhea, history of breast carcinoma and bilateral mastectomy EXAM: CT ABDOMEN AND PELVIS WITH CONTRAST TECHNIQUE: Multidetector CT imaging of the abdomen and pelvis was performed using the standard protocol following bolus administration of intravenous contrast. CONTRAST:  150mL ISOVUE-300 IOPAMIDOL (ISOVUE-300) INJECTION 61% COMPARISON:  None. FINDINGS: Lower chest: The lung bases are clear. Hepatobiliary: The liver enhances with no focal abnormality and no ductal dilatation is seen. Surgical clips are present from prior cholecystectomy. Pancreas: The pancreas is normal in size and the pancreatic duct is not dilated. Spleen: The spleen is unremarkable. Adrenals/Urinary Tract: The adrenal glands appear normal. The kidneys enhance with no calculus or mass. There is a small cyst emanating from the lower pole of the left kidney. The ureters are normal in caliber. The urinary bladder is not well distended but no abnormality is seen. Stomach/Bowel: The stomach is not well  distended but no abnormality is seen. No small bowel distention is noted. There are scattered rectosigmoid colon diverticula present. The terminal ileum and the appendix are unremarkable. Vascular/Lymphatic: The abdominal aorta is normal in caliber although somewhat ectatic. No adenopathy is seen. Reproductive: The uterus has previously been resected. No adnexal lesion is seen. No fluid is noted within the pelvis. Other: A mid anterior abdominal wall hernia is present containing only fat. Musculoskeletal: Changes of prior breast reconstruction are noted. The lumbar vertebrae are in normal alignment. There is degenerative disc disease at L5-S1 and and L1-2. Degenerative change also involves the facet joints of the lumbar spine. IMPRESSION: 1. No explanation for the patient's abdominal pain is seen. 2. Small midline abdominal hernia containing only fat. 3. Degenerative change of facet joints of the lumbar spine as noted above. Electronically Signed   By: Ivar Drape M.D.   On: 09/26/2016 10:12    Assessment & Plan:   There are no diagnoses linked to this encounter. I am having Ms. Medley maintain her INSULIN SYRINGE 1CC/29G, loratadine, polyvinyl alcohol, Phosphatidylserine-DHA-EPA, amoxicillin, metFORMIN, meloxicam, amLODipine, omeprazole, gabapentin, cholecalciferol, DULoxetine, losartan,  and hyoscyamine.  No orders of the defined types were placed in this encounter.    Follow-up: No Follow-up on file.  Walker Kehr, MD

## 2016-11-04 ENCOUNTER — Telehealth: Payer: Self-pay | Admitting: Gastroenterology

## 2016-11-04 NOTE — Telephone Encounter (Signed)
Pt has been scheduled to see Dr Ardis Hughs on 12/20/16.

## 2016-11-04 NOTE — Telephone Encounter (Signed)
OK to book with me for my first available [facility] office visit. Do not double book and do not book with an extender prior to then.   If the appointment is too far out for her, offer her appointment with an extender but then her GI MD will be assumed by whomever is covering the extenders that day and not necessarily me.    Thanks

## 2016-11-04 NOTE — Telephone Encounter (Signed)
Left message on machine to call back  

## 2016-11-13 NOTE — Telephone Encounter (Signed)
Error

## 2016-12-10 ENCOUNTER — Ambulatory Visit (INDEPENDENT_AMBULATORY_CARE_PROVIDER_SITE_OTHER)
Admission: RE | Admit: 2016-12-10 | Discharge: 2016-12-10 | Disposition: A | Discharge status: Home / Self Care | Payer: Medicare HMO | Source: Ambulatory Visit | Attending: Internal Medicine | Admitting: Internal Medicine

## 2016-12-10 ENCOUNTER — Encounter: Payer: Self-pay | Admitting: Internal Medicine

## 2016-12-10 ENCOUNTER — Ambulatory Visit (INDEPENDENT_AMBULATORY_CARE_PROVIDER_SITE_OTHER): Payer: Medicare HMO | Admitting: Internal Medicine

## 2016-12-10 VITALS — BP 162/84 | HR 80 | Temp 98.0°F | Resp 16 | Ht 67.0 in | Wt 184.0 lb

## 2016-12-10 DIAGNOSIS — M25571 Pain in right ankle and joints of right foot: Secondary | ICD-10-CM

## 2016-12-10 DIAGNOSIS — M7989 Other specified soft tissue disorders: Secondary | ICD-10-CM | POA: Diagnosis not present

## 2016-12-10 NOTE — Assessment & Plan Note (Signed)
Checking x-ray of the right ankle. Suspect strain and given instructions for RICE. We talked about possible implications if there is a fracture. Adjust as needed.

## 2016-12-10 NOTE — Patient Instructions (Addendum)
We are checking an x-ray of the ankle to make sure there are no fractures.   It is okay to keep using the tylenol for pain. The maximum daily dose is 3000 mg or 6 of the 500 mg pills.    RICE for Routine Care of Injuries Introduction Many injuries can be cared for using rest, ice, compression, and elevation (RICE therapy). Using RICE therapy can help to lessen pain and swelling. It can help your body to heal. Rest  Reduce your normal activities and avoid using the injured part of your body. You can go back to your normal activities when you feel okay and your doctor says it is okay. Ice  Do not put ice on your bare skin.  Put ice in a plastic bag.  Place a towel between your skin and the bag.  Leave the ice on for 20 minutes, 2-3 times a day. Do this for as long as told by your doctor. Compression  Compression means putting pressure on the injured area. This can be done with an elastic bandage. If an elastic bandage has been applied:  Remove and reapply the bandage every 3-4 hours or as told by your doctor.  Make sure the bandage is not wrapped too tight. Wrap the bandage more loosely if part of your body beyond the bandage is blue, swollen, cold, painful, or loses feeling (numb).  See your doctor if the bandage seems to make your problems worse. Elevation  Elevation means keeping the injured area raised. Raise the injured area above your heart or the center of your chest if you can. When should I get help? You should get help if:  You keep having pain and swelling.  Your symptoms get worse. Get help right away if: You should get help right away if:  You have sudden bad pain at or below the area of your injury.  You have redness or more swelling around your injury.  You have tingling or numbness at or below the injury that does not go away when you take off the bandage. This information is not intended to replace advice given to you by your health care provider. Make sure  you discuss any questions you have with your health care provider. Document Released: 04/22/2008 Document Revised: 04/11/2016 Document Reviewed: 10/12/2014  2017 Elsevier

## 2016-12-10 NOTE — Progress Notes (Signed)
Pre visit review using our clinic review tool, if applicable. No additional management support is needed unless otherwise documented below in the visit note. 

## 2016-12-10 NOTE — Progress Notes (Signed)
   Subjective:    Patient ID: Claudia [name], female    DOB: 1946-07-13, 71 y.o.   MRN: [id number]  HPI The patient is a 71 YO female coming in for [name] ankle pain. She fell on Sunday. Slipped on some ice going down stairs and foot twisted inwards. She was able to walk [name] afterwards. Some significant swelling the day after and some still. She is mostly resting and elevating her foot. She is still able to walk on it with some discomfort. Using tylenol for pain with good success. She just wants to make sure it is not broken.   Review of Systems  Constitutional: Positive for activity change. Negative for appetite change, chills, fatigue, fever and unexpected weight change.  Respiratory: Negative.   Cardiovascular: Negative.   Gastrointestinal: Negative.   Musculoskeletal: Positive for arthralgias, gait problem and myalgias. Negative for back pain, joint swelling, neck pain and neck stiffness.  Neurological: Negative for dizziness, tremors, speech difficulty and weakness.      Objective:   Physical Exam  Constitutional: She is oriented to person, place, and time. She appears well-developed and well-nourished.  HENT:  Head: Normocephalic and atraumatic.  Eyes: EOM are normal.  Neck: Normal range of motion.  Cardiovascular: Normal rate and regular rhythm.   Pulmonary/Chest: Effort normal.  Abdominal: Soft. Bowel sounds are normal.  Musculoskeletal: She exhibits tenderness.  Trace edema in the ankle, pain over the lateral malleolus, no pain with achilles tendon or in the foot.   Neurological: She is alert and oriented to person, place, and time.  Skin: Skin is warm and dry.   Vitals:   12/10/16 0853  BP: (!) 162/84  Pulse: 80  Resp: 16  Temp: 98 F (36.7 C)  TempSrc: Oral  SpO2: 97%  Weight: 184 lb (83.5 kg)  Height: 5\' 7"  (1.702 m)      Assessment & Plan:

## 2016-12-20 ENCOUNTER — Ambulatory Visit: Payer: Commercial Managed Care - HMO | Admitting: Gastroenterology

## 2016-12-23 ENCOUNTER — Ambulatory Visit: Payer: Commercial Managed Care - HMO | Admitting: Gastroenterology

## 2017-01-21 ENCOUNTER — Ambulatory Visit: Payer: Commercial Managed Care - HMO | Admitting: Internal Medicine

## 2017-01-21 ENCOUNTER — Ambulatory Visit: Payer: Medicare HMO | Admitting: Gastroenterology

## 2017-01-24 ENCOUNTER — Ambulatory Visit: Payer: Medicare HMO | Admitting: Internal Medicine

## 2017-01-24 ENCOUNTER — Encounter: Payer: Self-pay | Admitting: Internal Medicine

## 2017-01-24 ENCOUNTER — Ambulatory Visit (INDEPENDENT_AMBULATORY_CARE_PROVIDER_SITE_OTHER): Payer: Medicare HMO | Admitting: Internal Medicine

## 2017-01-24 DIAGNOSIS — E538 Deficiency of other specified B group vitamins: Secondary | ICD-10-CM | POA: Diagnosis not present

## 2017-01-24 DIAGNOSIS — F411 Generalized anxiety disorder: Secondary | ICD-10-CM | POA: Diagnosis not present

## 2017-01-24 DIAGNOSIS — M25571 Pain in right ankle and joints of right foot: Secondary | ICD-10-CM

## 2017-01-24 DIAGNOSIS — G8929 Other chronic pain: Secondary | ICD-10-CM | POA: Diagnosis not present

## 2017-01-24 DIAGNOSIS — E119 Type 2 diabetes mellitus without complications: Secondary | ICD-10-CM | POA: Diagnosis not present

## 2017-01-24 NOTE — Assessment & Plan Note (Signed)
On Duloxetine.

## 2017-01-24 NOTE — Progress Notes (Signed)
Subjective:  Patient ID: Claudia [name], female    DOB: September 27, 1946  Age: 71 y.o. MRN: [id number]  CC: Follow-up (DM type 2,, HTN, depression,right ankle pain)   HPI Brent B Sinyard presents for R lower lat leg/ankle pain - worse F/u depression and FMS, elev glu  Outpatient Medications Prior to Visit  Medication Sig Dispense Refill  . amLODipine (NORVASC) 2.5 MG tablet TAKE 1 TABLET DAILY 90 tablet 3  . amoxicillin (AMOXIL) 500 MG capsule 2000 mg po 1 hr prior to procedure 20 capsule 1  . cholecalciferol (VITAMIN D) 1000 units tablet Take 1 tablet by mouth daily.    . DULoxetine (CYMBALTA) 60 MG capsule Take 1 capsule (60 mg total) by mouth daily. For depression 90 capsule 3  . gabapentin (NEURONTIN) 600 MG tablet TAKE 1 TABLET THREE TIMES DAILY 270 tablet 3  . INSULIN SYRINGE 1CC/29G (B-D INSULIN SYRINGE) 29G X 1/2" 1 ML MISC As dirrected 50 each 3  . loratadine (CLARITIN) 10 MG tablet Take 1 tablet (10 mg total) by mouth daily. As needed for allergies (Patient taking differently: Take 10 mg by mouth daily as needed for allergies. ) 90 tablet 1  . losartan (COZAAR) 100 MG tablet Take 1 tablet (100 mg total) by mouth daily. 90 tablet 3  . meloxicam (MOBIC) 15 MG tablet TAKE 1 TABLET EVERY DAY 90 tablet 3  . metFORMIN (GLUCOPHAGE) 500 MG tablet Take 1 tablet (500 mg total) by mouth daily with breakfast. 180 tablet 3  . omeprazole (PRILOSEC) 40 MG capsule TAKE 1 CAPSULE TWICE DAILY 180 capsule 3  . Phosphatidylserine-DHA-EPA (VAYACOG) 100-19.5-6.5 MG CAPS Take 1 capsule by mouth daily. 30 capsule 11  . polyvinyl alcohol (LIQUIFILM TEARS) 1.4 % ophthalmic solution Place 1-2 drops into both eyes daily as needed for dry eyes.    . hyoscyamine (LEVSIN, ANASPAZ) 0.125 MG tablet Take 1-2 tablets (0.125-0.25 mg total) by mouth every 4 (four) hours as needed for cramping. 100 tablet 1   No facility-administered medications prior to visit.     ROS Review of Systems  Constitutional: Positive  for fatigue. Negative for activity change, appetite change, chills and unexpected weight change.  HENT: Negative for congestion, mouth sores and sinus pressure.   Eyes: Negative for visual disturbance.  Respiratory: Negative for cough and chest tightness.   Gastrointestinal: Negative for abdominal pain and nausea.  Genitourinary: Negative for difficulty urinating, frequency and vaginal pain.  Musculoskeletal: Positive for arthralgias, gait problem, joint swelling and myalgias. Negative for back pain.  Skin: Negative for pallor and rash.  Neurological: Negative for dizziness, tremors, weakness, numbness and headaches.  Psychiatric/Behavioral: Negative for confusion and sleep disturbance. The patient is nervous/anxious.     Objective:  BP 140/88   Pulse (!) 47   Temp 98 F (36.7 C) (Oral)   Resp 16   Ht 5\' 7"  (1.702 m)   Wt 180 lb 8 oz (81.9 kg)   SpO2 91%   BMI 28.27 kg/m   BP Readings from Last 3 Encounters:  01/24/17 140/88  12/10/16 (!) 162/84  10/02/16 118/70    Wt Readings from Last 3 Encounters:  01/24/17 180 lb 8 oz (81.9 kg)  12/10/16 184 lb (83.5 kg)  10/02/16 186 lb (84.4 kg)    Physical Exam  Constitutional: She appears well-developed. No distress.  HENT:  Head: Normocephalic.  Right Ear: External ear normal.  Left Ear: External ear normal.  Nose: Nose normal.  Mouth/Throat: Oropharynx is clear and moist.  Eyes:  Conjunctivae are normal. Pupils are equal, round, and reactive to light. Right eye exhibits no discharge. Left eye exhibits no discharge.  Neck: Normal range of motion. Neck supple. No JVD present. No tracheal deviation present. No thyromegaly present.  Cardiovascular: Normal rate, regular rhythm and normal heart sounds.   Pulmonary/Chest: No stridor. No respiratory distress. She has no wheezes.  Abdominal: Soft. Bowel sounds are normal. She exhibits no distension and no mass. There is no tenderness. There is no rebound and no guarding.    Musculoskeletal: She exhibits edema and tenderness.  Lymphadenopathy:    She has no cervical adenopathy.  Neurological: She displays normal reflexes. No cranial nerve deficit. She exhibits normal muscle tone. Coordination abnormal.  Skin: No rash noted. No erythema.  Psychiatric: She has a normal mood and affect. Her behavior is normal. Judgment and thought content normal.   R lat distal leg is tender and swollen; tender over peroneal tendon   Lab Results  Component Value Date   WBC 6.6 09/12/2016   HGB 14.1 09/12/2016   HCT 41.8 09/12/2016   PLT 290.0 09/12/2016   GLUCOSE 90 09/12/2016   CHOL 212 (H) 09/22/2015   TRIG 124.0 09/22/2015   HDL 43.50 09/22/2015   LDLCALC 144 (H) 09/22/2015   ALT 12 09/12/2016   AST 18 09/12/2016   NA 142 09/12/2016   K 5.0 09/12/2016   CL 107 09/12/2016   CREATININE 1.05 09/12/2016   BUN 18 09/12/2016   CO2 30 09/12/2016   TSH 4.06 05/14/2016   INR 1.02 05/26/2015   HGBA1C 6.0 05/14/2016    Dg Ankle Complete Right  Result Date: 12/10/2016 CLINICAL DATA:  71 year old female with a history of swelling and ankle pain at the lateral ankle from a fall 3 days ago EXAM: RIGHT ANKLE - COMPLETE 3+ VIEW COMPARISON:  Right ankle 06/07/2014 FINDINGS: No acute displaced fracture identified. Mild soft tissue swelling at the right lateral ankle. Ankle mortise is congruent. Compare to the prior plain film there has been healing of the previous distal fibula fracture. Degenerative changes are present. Enthesopathic changes at the plantar fascia insertion. IMPRESSION: No radiographic evidence of acute bony abnormality. Since the prior plain film there has been healing of the previous fibular fracture. Signed, Dulcy Fanny. Earleen Newport, DO [facility] Electronically Signed   By: Corrie Mckusick D.O.   On: 12/10/2016 09:35    Assessment & Plan:   There are no diagnoses linked to this encounter. I am having Ms.  Mcglasson maintain her INSULIN SYRINGE 1CC/29G, loratadine, polyvinyl alcohol, Phosphatidylserine-DHA-EPA, amoxicillin, metFORMIN, meloxicam, amLODipine, omeprazole, gabapentin, cholecalciferol, DULoxetine, losartan, and hyoscyamine.  No orders of the defined types were placed in this encounter.    Follow-up: No Follow-up on file.  Walker Kehr, MD

## 2017-01-24 NOTE — Progress Notes (Signed)
Pre-visit discussion using our clinic review tool. No additional management support is needed unless otherwise documented below in the visit note.  

## 2017-01-24 NOTE — Assessment & Plan Note (Signed)
On Metformin 

## 2017-01-24 NOTE — Assessment & Plan Note (Signed)
On B12 sl 

## 2017-01-24 NOTE — Assessment & Plan Note (Signed)
?   R peroneal tendonitis Ortho ref

## 2017-02-06 DIAGNOSIS — Z85828 Personal history of other malignant neoplasm of skin: Secondary | ICD-10-CM | POA: Diagnosis not present

## 2017-02-06 DIAGNOSIS — L814 Other melanin hyperpigmentation: Secondary | ICD-10-CM | POA: Diagnosis not present

## 2017-02-06 DIAGNOSIS — L821 Other seborrheic keratosis: Secondary | ICD-10-CM | POA: Diagnosis not present

## 2017-02-06 DIAGNOSIS — L570 Actinic keratosis: Secondary | ICD-10-CM | POA: Diagnosis not present

## 2017-02-06 DIAGNOSIS — D1801 Hemangioma of skin and subcutaneous tissue: Secondary | ICD-10-CM | POA: Diagnosis not present

## 2017-02-07 DIAGNOSIS — M25571 Pain in right ankle and joints of right foot: Secondary | ICD-10-CM | POA: Diagnosis not present

## 2017-03-03 ENCOUNTER — Telehealth: Payer: Self-pay | Admitting: Internal Medicine

## 2017-03-04 ENCOUNTER — Encounter: Payer: Self-pay | Admitting: Gastroenterology

## 2017-03-04 ENCOUNTER — Telehealth: Payer: Self-pay | Admitting: Internal Medicine

## 2017-03-04 ENCOUNTER — Ambulatory Visit (INDEPENDENT_AMBULATORY_CARE_PROVIDER_SITE_OTHER): Payer: Medicare HMO | Admitting: Gastroenterology

## 2017-03-04 VITALS — BP 130/78 | HR 72 | Ht 65.5 in | Wt 185.0 lb

## 2017-03-04 DIAGNOSIS — Z9889 Other specified postprocedural states: Secondary | ICD-10-CM

## 2017-03-04 DIAGNOSIS — R109 Unspecified abdominal pain: Secondary | ICD-10-CM

## 2017-03-04 DIAGNOSIS — R0789 Other chest pain: Secondary | ICD-10-CM

## 2017-03-04 DIAGNOSIS — G8929 Other chronic pain: Secondary | ICD-10-CM

## 2017-03-04 DIAGNOSIS — Z8781 Personal history of (healed) traumatic fracture: Secondary | ICD-10-CM

## 2017-03-04 DIAGNOSIS — N959 Unspecified menopausal and perimenopausal disorder: Secondary | ICD-10-CM

## 2017-03-04 DIAGNOSIS — R1031 Right lower quadrant pain: Secondary | ICD-10-CM | POA: Diagnosis not present

## 2017-03-04 MED ORDER — NA SULFATE-K SULFATE-MG SULF 17.5-3.13-1.6 GM/177ML PO SOLN: 1.0000 | Freq: Once | ORAL | 0 refills | Status: AC

## 2017-03-04 NOTE — Telephone Encounter (Signed)
Patient is requesting a referral to her breast surgeon. She saw GI today and none of the pain issues she is having with her stomach seems to be coming to that. She believes it is related to her breast surgery. She would like to follow up with them. She saw Dr. Harlow Mares at [facility] [place].  She also would like a referral to have her bone density. She said her insurance called and said because of when she broke her foot she should have one.

## 2017-03-04 NOTE — Patient Instructions (Signed)
You will be set up for a colonoscopy for RLQ pain.

## 2017-03-04 NOTE — Progress Notes (Signed)
HPI: This is a   whvery pleasant 71 year old woman who was referred to me by Plotnikov, Evie Lacks, MD  to evalright lower quadrant discomfort .    Chief complaint is  right lower quadrant discomfort  Has been having RLQ pain.    this is mostly an ache. For at least a year.  Feels it may be hernia related.  Can double her over.  Worse with certain movements, position.  Wearing a girdle helps.  she does not generally have difficulty moving her bowels.   No nausea vomiting, no overt GI bleeding.  Has trouble with her memory.   Old Data Reviewed:  CT scan report 09/2016 (abd/pelvis with IV contrast) : 1. No explanation for the patient's abdominal pain is seen. 2. Small midline abdominal hernia containing only fat. 3. Degenerative change of facet joints of the lumbar spine as noted above.  EGD Dr. Sharlett Iles for "chest pain" 11/2008: gastritis; biopsies   09/2010 [facility] esophageal manometry for GERD, Dr. Derrill Kay: normal 09/2010 pH study [facility], on PPI; was normal:     Review of systems: Pertinent positive and negative review of systems were noted in the above HPI section. Complete review of systems was performed and was otherwise normal.   Past Medical History:  Diagnosis Date  . Acute upper respiratory infections of unspecified site   . Anxiety state, unspecified   . Breast cancer ([place])   . Chest pain, unspecified   . Cramp of limb   . Depressive disorder, not elsewhere classified   . Diverticulitis    pt stated  . DM (diabetes mellitus) ([place])   . Headache(784.0)   . Hiatal hernia   . History of cold sores   . IBS (irritable bowel syndrome)   . Insomnia, unspecified   . Internal hemorrhoids without mention of complication   . Irritable bowel syndrome   . Myalgia and myositis, unspecified   . Neuralgia of chest    post op  . Neuralgia, neuritis, and radiculitis, unspecified   . Osteoarthritis   . Osteoarthrosis, unspecified whether generalized or localized,  unspecified site   . Other B-complex deficiencies   . Other malaise and fatigue   . Personal history of malignant neoplasm of breast   . Pneumonia    hx of  . Restless legs syndrome (RLS)   . Seizures ([place])    "mini seizures" 15 yrs. ago. Pt. states did have work up and was normal  . Stricture and stenosis of esophagus   . Unspecified essential hypertension     Past Surgical History:  Procedure Laterality Date  . ABDOMINAL HYSTERECTOMY    . breast reconstruction surgery bilateral - 8 yrs. ago    . CHOLECYSTECTOMY    . deviated septum surgery x 3    . hx. of bell's palsy at 14 yrs. old    . MASTECTOMY     bilateral total  . 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 rotator cuff surgery -left shoulder- 8 yrs. ago    . salva gland removed from left side of neck 60 yrs. ago    . TONSILLECTOMY    . TOTAL KNEE ARTHROPLASTY Left 06/01/2015   Procedure: LEFT TOTAL KNEE ARTHROPLASTY;  Surgeon: Rod Can, MD;  Location: [facility];  Service: Orthopedics;  Laterality: Left;    Current Outpatient Prescriptions  Medication Sig Dispense Refill  . amLODipine (NORVASC) 2.5 MG tablet TAKE 1 TABLET DAILY 90 tablet 3  . cholecalciferol (VITAMIN  D) 1000 units tablet Take 1 tablet by mouth daily.    . DULoxetine (CYMBALTA) 60 MG capsule Take 1 capsule (60 mg total) by mouth daily. For depression 90 capsule 3  . gabapentin (NEURONTIN) 600 MG tablet TAKE 1 TABLET THREE TIMES DAILY 270 tablet 3  . losartan (COZAAR) 100 MG tablet Take 1 tablet (100 mg total) by mouth daily. 90 tablet 3  . meloxicam (MOBIC) 15 MG tablet TAKE 1 TABLET EVERY DAY 90 tablet 3  . metFORMIN (GLUCOPHAGE) 500 MG tablet Take 1 tablet (500 mg total) by mouth daily with breakfast. 180 tablet 3  . omeprazole (PRILOSEC) 40 MG capsule TAKE 1 CAPSULE TWICE DAILY 180 capsule 3  . Phosphatidylserine-DHA-EPA (VAYACOG) 100-19.5-6.5 MG CAPS Take 1 capsule by mouth daily. 30 capsule 11   No current facility-administered  medications for this visit.     Allergies as of 03/04/2017 - Review Complete 03/04/2017  Allergen Reaction Noted  . Bupropion hcl  02/06/2010  . Lovastatin    . Norco [hydrocodone-acetaminophen] Itching 03/10/2015  . Olanzapine-fluoxetine hcl  07/19/2009  . Sodium lauryl sulfate    . Sulfonamide derivatives Hives and Itching   . Tramadol  03/10/2015  . Tape Rash 05/26/2015    Family History  Problem Relation Age of Onset  . Diabetes Mother   . Kidney disease Mother   . Lymphoma Mother   . Diabetes Sister   . Heart disease Sister   . Stroke Sister   . Diabetes Brother   . Kidney disease Brother   . Coronary artery disease Brother   . Breast cancer Paternal Grandmother   . Coronary artery disease Maternal Grandmother     Social History   Social History  . Marital status: Married    Spouse name: N/A  . Number of children: N/A  . Years of education: N/A   Occupational History  . Retired    Social History Main Topics  . Smoking status: Former Smoker    Quit date: 11/26/1971  . Smokeless tobacco: Never Used  . Alcohol use No  . Drug use: No  . Sexual activity: Yes   Other Topics Concern  . Not on file   Social History Narrative      [employer] - Retired again 2011      Regular exercise - YES, walking      Family history   B MG, CAD     Physical Exam: Ht 5' 5.5" (1.664 m) Comment: height measured without shoes  Wt 185 lb (83.9 kg)   BMI 30.32 kg/m  Constitutional: generally well-appearing Psychiatric: alert and oriented x3 Eyes: extraocular movements intact Mouth: oral pharynx moist, no lesions Neck: supple no lymphadenopathy Cardiovascular: heart regular rate and rhythm Lungs: clear to auscultation bilaterally Abdomen: soft, nontender, nondistended, no obvious ascites, no peritoneal signs, normal bowel sounds Extremities: no lower extremity edema bilaterally Skin: no lesions on visible extremities   Assessment and plan: 71 y.o. female  withright lower quadrant discomfort that is positional  I reviewed her CT scan images. She does have a small ventral hernia but I do not think that that is causing her right lower quadrant discomfort. No clear masses or tumors on the CT scan. I suspected it may be adhesion related is certainly positional. I doubt it is anything serious. I do recommend that we go ahead with colonoscopy since it seems to have been 18 or 19 years she has had colon cancer screening with colonoscopy.   Please see the "Patient  Instructions" section for addition details about the plan.   Owens Loffler, MD [facility] 03/04/2017, 10:32 AM  Cc: Cassandria Anger, MD

## 2017-03-05 NOTE — Telephone Encounter (Signed)
Patient is requesting for 2 referrals, please advise.

## 2017-03-16 NOTE — Telephone Encounter (Signed)
OK BDS OK plasic surg ref

## 2017-03-17 ENCOUNTER — Encounter: Payer: Self-pay | Admitting: Gastroenterology

## 2017-03-17 ENCOUNTER — Ambulatory Visit (AMBULATORY_SURGERY_CENTER): Payer: Medicare HMO | Admitting: Gastroenterology

## 2017-03-17 VITALS — BP 110/67 | HR 68 | Temp 98.4°F | Resp 14 | Ht 65.0 in | Wt 185.0 lb

## 2017-03-17 DIAGNOSIS — R1031 Right lower quadrant pain: Secondary | ICD-10-CM | POA: Diagnosis present

## 2017-03-17 DIAGNOSIS — D125 Benign neoplasm of sigmoid colon: Secondary | ICD-10-CM

## 2017-03-17 DIAGNOSIS — K5730 Diverticulosis of large intestine without perforation or abscess without bleeding: Secondary | ICD-10-CM

## 2017-03-17 DIAGNOSIS — K635 Polyp of colon: Secondary | ICD-10-CM

## 2017-03-17 DIAGNOSIS — E119 Type 2 diabetes mellitus without complications: Secondary | ICD-10-CM | POA: Diagnosis not present

## 2017-03-17 DIAGNOSIS — R569 Unspecified convulsions: Secondary | ICD-10-CM | POA: Diagnosis not present

## 2017-03-17 MED ORDER — SODIUM CHLORIDE 0.9 % IV SOLN: 500.0000 mL | INTRAVENOUS | Status: DC

## 2017-03-17 NOTE — Progress Notes (Signed)
Report given to PACU, vss 

## 2017-03-17 NOTE — Progress Notes (Signed)
Called to room to assist during endoscopic procedure.  Patient ID and intended procedure confirmed with present staff. Received instructions for my participation in the procedure from the performing physician.  

## 2017-03-17 NOTE — Progress Notes (Signed)
Pt's states no medical or surgical changes since previsit or office visit. 

## 2017-03-17 NOTE — Patient Instructions (Signed)
Impression/recommendations:  Polyps (handout given) Diverticulosis (handout given)  YOU HAD AN ENDOSCOPIC PROCEDURE TODAY AT THE [facility]:   Refer to the procedure report that was given to you for any specific questions about what was found during the examination.  If the procedure report does not answer your questions, please call your gastroenterologist to clarify.  If you requested that your care partner not be given the details of your procedure findings, then the procedure report has been included in a sealed envelope for you to review at your convenience later.  YOU SHOULD EXPECT: Some feelings of bloating in the abdomen. Passage of more gas than usual.  Walking can help get rid of the air that was put into your GI tract during the procedure and reduce the bloating. If you had a lower endoscopy (such as a colonoscopy or flexible sigmoidoscopy) you may notice spotting of blood in your stool or on the toilet paper. If you underwent a bowel prep for your procedure, you may not have a normal bowel movement for a few days.  Please Note:  You might notice some irritation and congestion in your nose or some drainage.  This is from the oxygen used during your procedure.  There is no need for concern and it should clear up in a day or so.  SYMPTOMS TO REPORT IMMEDIATELY:   Following lower endoscopy (colonoscopy or flexible sigmoidoscopy):  Excessive amounts of blood in the stool  Significant tenderness or worsening of abdominal pains  Swelling of the abdomen that is new, acute  Fever of 100F or higher   For urgent or emergent issues, a gastroenterologist can be reached at any hour by calling [phone].   DIET:  We do recommend a small meal at first, but then you may proceed to your regular diet.  Drink plenty of fluids but you should avoid alcoholic beverages for 24 hours.  ACTIVITY:  You should plan to take it easy for the rest of today and you should NOT DRIVE or use  heavy machinery until tomorrow (because of the sedation medicines used during the test).    FOLLOW UP: Our staff will call the number listed on your records the next business day following your procedure to check on you and address any questions or concerns that you may have regarding the information given to you following your procedure. If we do not reach you, we will leave a message.  However, if you are feeling well and you are not experiencing any problems, there is no need to return our call.  We will assume that you have returned to your regular daily activities without incident.  If any biopsies were taken you will be contacted by phone or by letter within the next 1-3 weeks.  Please call us at [phone] if you have not heard about the biopsies in 3 weeks.    SIGNATURES/CONFIDENTIALITY: You and/or your care partner have signed paperwork which will be entered into your electronic medical record.  These signatures attest to the fact that that the information above on your After Visit Summary has been reviewed and is understood.  Full responsibility of the confidentiality of this discharge information lies with you and/or your care-partner. 

## 2017-03-17 NOTE — Telephone Encounter (Signed)
LM notifying pt referrals are in

## 2017-03-17 NOTE — Op Note (Signed)
[place] Patient Name: Claudia [name] Procedure Date: 03/17/2017 10:19 AM MRN: [id number] Endoscopist: Milus Banister , MD Age: 71 Referring MD:  Date of Birth: 10-17-1946 Gender: Female Account #: [id number] Procedure:                Colonoscopy Indications:              Abdominal pain in the right lower quadrant Medicines:                Monitored Anesthesia Care Procedure:                Pre-Anesthesia Assessment:                           - Prior to the procedure, a History and Physical                            was performed, and patient medications and                            allergies were reviewed. The patient's tolerance of                            previous anesthesia was also reviewed. The risks                            and benefits of the procedure and the sedation                            options and risks were discussed with the patient.                            All questions were answered, and informed consent                            was obtained. Prior Anticoagulants: The patient has                            taken no previous anticoagulant or antiplatelet                            agents. ASA Grade Assessment: II - A patient with                            mild systemic disease. After reviewing the risks                            and benefits, the patient was deemed in                            satisfactory condition to undergo the procedure.                           After obtaining informed consent, the colonoscope  was passed under direct vision. Throughout the                            procedure, the patient's blood pressure, pulse, and                            oxygen saturations were monitored continuously. The                            Colonoscope was introduced through the anus and                            advanced to the the cecum, identified by                            appendiceal orifice and  ileocecal valve. The                            colonoscopy was performed without difficulty. The                            patient tolerated the procedure well. The quality                            of the bowel preparation was excellent. The                            ileocecal valve, appendiceal orifice, and rectum                            were photographed. Scope In: 10:22:52 AM Scope Out: 10:37:01 AM Scope Withdrawal Time: 0 hours 11 minutes 38 seconds  Total Procedure Duration: 0 hours 14 minutes 9 seconds  Findings:                 Four sessile polyps were found in the sigmoid                            colon. The polyps were 2 to 4 mm in size. These                            polyps were removed with a cold snare. Resection                            and retrieval were complete.                           Multiple small and large-mouthed diverticula were                            found in the left colon.                           The exam was otherwise without abnormality on  direct and retroflexion views. Complications:            No immediate complications. Estimated blood loss:                            None. Estimated Blood Loss:     Estimated blood loss: none. Impression:               - Four 2 to 4 mm polyps in the sigmoid colon,                            removed with a cold snare. Resected and retrieved.                           - Diverticulosis in the left colon.                           - The examination was otherwise normal on direct                            and retroflexion views. Recommendation:           - Patient has a contact number available for                            emergencies. The signs and symptoms of potential                            delayed complications were discussed with the                            patient. Return to normal activities tomorrow.                            Written discharge instructions were  provided to the                            patient.                           - Resume previous diet.                           - Continue present medications.                           You will receive a letter within 2-3 weeks with the                            pathology results and my final recommendations.                           If the polyp(s) is proven to be 'pre-cancerous' on                            pathology, you will need repeat colonoscopy in 3-5  years. If the polyp(s) is NOT 'precancerous' on                            pathology then you should repeat colon cancer                            screening in 10 years with colonoscopy without need                            for colon cancer screening by any method prior to                            then (including stool testing). Milus Banister, MD 03/17/2017 10:39:17 AM This report has been signed electronically.

## 2017-03-18 ENCOUNTER — Telehealth: Payer: Self-pay | Admitting: *Deleted

## 2017-03-18 ENCOUNTER — Telehealth: Payer: Self-pay

## 2017-03-18 NOTE — Telephone Encounter (Signed)
Left message on answering machine. 

## 2017-03-18 NOTE — Telephone Encounter (Signed)
  Follow up Call-  Call back number 03/17/2017  Post procedure Call Back phone  # [phone]  Permission to leave phone message Yes  Some recent data might be hidden     Patient questions:  Do you have a fever, pain , or abdominal swelling? No. Pain Score  0 *  Have you tolerated food without any problems? Yes.    Have you been able to return to your normal activities? Yes.    Do you have any questions about your discharge instructions: Diet   No. Medications  No. Follow up visit  No.  Do you have questions or concerns about your Care? No.  Actions: * If pain score is 4 or above: No action needed, pain <4.

## 2017-03-24 ENCOUNTER — Encounter: Payer: Self-pay | Admitting: Gastroenterology

## 2017-04-02 ENCOUNTER — Ambulatory Visit (INDEPENDENT_AMBULATORY_CARE_PROVIDER_SITE_OTHER)
Admission: RE | Admit: 2017-04-02 | Discharge: 2017-04-02 | Disposition: A | Discharge status: Home / Self Care | Payer: Medicare HMO | Source: Ambulatory Visit | Attending: Internal Medicine | Admitting: Internal Medicine

## 2017-04-02 DIAGNOSIS — N959 Unspecified menopausal and perimenopausal disorder: Secondary | ICD-10-CM

## 2017-04-02 DIAGNOSIS — E119 Type 2 diabetes mellitus without complications: Secondary | ICD-10-CM | POA: Diagnosis not present

## 2017-04-02 DIAGNOSIS — Z961 Presence of intraocular lens: Secondary | ICD-10-CM | POA: Diagnosis not present

## 2017-04-02 DIAGNOSIS — H52223 Regular astigmatism, bilateral: Secondary | ICD-10-CM | POA: Diagnosis not present

## 2017-04-02 DIAGNOSIS — H5212 Myopia, left eye: Secondary | ICD-10-CM | POA: Diagnosis not present

## 2017-04-02 DIAGNOSIS — H524 Presbyopia: Secondary | ICD-10-CM | POA: Diagnosis not present

## 2017-04-02 DIAGNOSIS — H353131 Nonexudative age-related macular degeneration, bilateral, early dry stage: Secondary | ICD-10-CM | POA: Diagnosis not present

## 2017-04-02 LAB — HM DIABETES EYE EXAM

## 2017-04-15 NOTE — Progress Notes (Unsigned)
Results entered and sent to scan  

## 2017-05-02 DIAGNOSIS — M25571 Pain in right ankle and joints of right foot: Secondary | ICD-10-CM | POA: Diagnosis not present

## 2017-05-08 DIAGNOSIS — Z961 Presence of intraocular lens: Secondary | ICD-10-CM | POA: Diagnosis not present

## 2017-05-08 DIAGNOSIS — E119 Type 2 diabetes mellitus without complications: Secondary | ICD-10-CM | POA: Diagnosis not present

## 2017-05-08 DIAGNOSIS — H353131 Nonexudative age-related macular degeneration, bilateral, early dry stage: Secondary | ICD-10-CM | POA: Diagnosis not present

## 2017-05-11 NOTE — Progress Notes (Signed)
Subjective:   Claudia [name] is a 71 y.o. female who presents for Medicare Annual (Subsequent) preventive examination.  Review of Systems:  No ROS.  Medicare Wellness Visit. Additional risk factors are reflected in the social history.  Cardiac Risk Factors include: diabetes mellitus;dyslipidemia;hypertension Sleep patterns: gets up 1-2 times nightly to void and sleeps 6-7 hours nightly.    Home Safety/Smoke Alarms: Feels safe in home. Smoke alarms in place.  Living environment; residence and Firearm Safety: 2-story house, no firearms.Lives with husband, no needs for DME Seat Belt Safety/Bike Helmet: Wears seat belt.   Counseling:   Eye Exam- appointment yearly Dental- appointment every 6 months   Female:   Pap- N/A      Mammo- Double mastectomy     Dexa scan- Last 04/02/17, osteoporosis       CCS- Last 03/17/17, polyps,  Recall 5 years     Objective:     Vitals: BP 126/76 (BP Location: Left Arm, Patient Position: Sitting, Cuff Size: Large)   Pulse 71   Temp 98 F (36.7 C) (Oral)   Ht 5\' 5"  (1.651 m)   Wt 177 lb (80.3 kg)   SpO2 98%   BMI 29.45 kg/m   Body mass index is 29.45 kg/m.   Tobacco History  Smoking Status  . Former Smoker  . Quit date: 11/26/1971  Smokeless Tobacco  . Never Used     Counseling given: Not Answered   Past Medical History:  Diagnosis Date  . Acute upper respiratory infections of unspecified site   . Anxiety state, unspecified   . Breast cancer ([place])   . Chest pain, unspecified   . Cramp of limb   . Depressive disorder, not elsewhere classified   . Diverticulitis    pt stated  . DM (diabetes mellitus) ([place])   . Headache(784.0)   . Hiatal hernia   . History of cold sores   . IBS (irritable bowel syndrome)   . Insomnia, unspecified   . Internal hemorrhoids without mention of complication   . Irritable bowel syndrome   . Myalgia and myositis, unspecified   . Neuralgia of chest    post op  . Neuralgia, neuritis, and  radiculitis, unspecified   . Osteoarthritis   . Osteoarthrosis, unspecified whether generalized or localized, unspecified site   . Other B-complex deficiencies   . Other malaise and fatigue   . Personal history of malignant neoplasm of breast   . Pneumonia    hx of  . Restless legs syndrome (RLS)   . Seizures ([place])    "mini seizures" 15 yrs. ago. Pt. states did have work up and was normal  . Stricture and stenosis of esophagus   . Unspecified essential hypertension    Past Surgical History:  Procedure Laterality Date  . ABDOMINAL HYSTERECTOMY    . breast reconstruction surgery bilateral - 8 yrs. ago    . CHOLECYSTECTOMY    . deviated septum surgery x 3    . hx. of bell's palsy at 14 yrs. old    . MASTECTOMY     bilateral total  . 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 rotator cuff surgery -left shoulder- 8 yrs. ago    . salva gland removed from left side of neck 60 yrs. ago    . TONSILLECTOMY    . TOTAL KNEE ARTHROPLASTY Left 06/01/2015   Procedure: LEFT TOTAL KNEE ARTHROPLASTY;  Surgeon: Rod Can, MD;  Location: [facility];  Service:  Orthopedics;  Laterality: Left;   Family History  Problem Relation Age of Onset  . Diabetes Mother   . Kidney disease Mother   . Lymphoma Mother   . Diabetes Sister   . Heart disease Sister   . Stroke Sister   . Diabetes Brother   . Kidney disease Brother   . Coronary artery disease Brother   . Breast cancer Paternal Grandmother   . Coronary artery disease Maternal Grandmother    History  Sexual Activity  . Sexual activity: Yes    Outpatient Encounter Prescriptions as of 05/13/2017  Medication Sig  . amLODipine (NORVASC) 2.5 MG tablet TAKE 1 TABLET DAILY  . cholecalciferol (VITAMIN D) 1000 units tablet Take 1 tablet by mouth daily.  . DULoxetine (CYMBALTA) 60 MG capsule Take 1 capsule (60 mg total) by mouth daily. For depression  . gabapentin (NEURONTIN) 600 MG tablet TAKE 1 TABLET THREE TIMES DAILY  . losartan  (COZAAR) 100 MG tablet Take 1 tablet (100 mg total) by mouth daily.  . meloxicam (MOBIC) 15 MG tablet TAKE 1 TABLET EVERY DAY  . metFORMIN (GLUCOPHAGE) 500 MG tablet Take 1 tablet (500 mg total) by mouth daily with breakfast.  . omeprazole (PRILOSEC) 40 MG capsule TAKE 1 CAPSULE TWICE DAILY  . Phosphatidylserine-DHA-EPA (VAYACOG) 100-19.5-6.5 MG CAPS Take 1 capsule by mouth daily.  Marland Kitchen liraglutide (VICTOZA) 18 MG/3ML SOPN Inject 0.2 mLs (1.2 mg total) into the skin daily.  Marland Kitchen Zoster Vac Recomb Adjuvanted [facility]) injection Inject 0.5 mLs into the muscle once. Repeat in 6 months   Facility-Administered Encounter Medications as of 05/13/2017  Medication  . 0.9 %  sodium chloride infusion    Activities of Daily Living In your present state of health, do you have any difficulty performing the following activities: 05/13/2017  Hearing? N  Vision? N  Difficulty concentrating or making decisions? N  Walking or climbing stairs? N  Dressing or bathing? N  Doing errands, shopping? N  Preparing Food and eating ? N  Using the Toilet? N  In the past six months, have you accidently leaked urine? N  Do you have problems with loss of bowel control? N  Managing your Medications? N  Managing your Finances? N  Housekeeping or managing your Housekeeping? N  Some recent data might be hidden    Patient Care Team: Plotnikov, Evie Lacks, MD as PCP - General Swinteck, Aaron Edelman, MD as Consulting Physician (Orthopedic Surgery)    Assessment:    Physical assessment deferred to PCP.  Exercise Activities and Dietary recommendations Current Exercise Habits: The patient does not participate in regular exercise at present, Exercise limited by: None identified  Diet (meal preparation, eat out, water intake, caffeinated beverages, dairy products, fruits and vegetables): in general, a "healthy" diet  , well balanced, low fat/ cholesterol, low salt Reviewed heart healthy and diabetic diet, encouraged patient to  increase daily water intake.  Goals    . <enter goal here>    . Enjoy my grand-daughter as much as possible      Fall Risk Fall Risk  05/13/2017 01/24/2017 01/30/2016  Falls in the past year? Yes Yes Yes  Number falls in past yr: 2 or more 1 2 or more  Injury with Fall? - Yes Yes  Risk Factor Category  - High Fall Risk High Fall Risk  Follow up Falls prevention discussed;Education provided Falls evaluation completed -   Depression Screen PHQ 2/9 Scores 05/13/2017 01/24/2017  PHQ - 2 Score 2 0  PHQ- 9  Score 8 -     Cognitive Function MMSE - Mini Mental State Exam 05/13/2017  Orientation to time 5  Orientation to Place 5  Registration 3  Attention/ Calculation 5  Recall 2  Language- name 2 objects 2  Language- repeat 1  Language- follow 3 step command 3  Language- read & follow direction 1  Write a sentence 1  Copy design 1  Total score 29        Immunization History  Administered Date(s) Administered  . H1N1 11/01/2008  . Influenza Split 08/20/2011  . Influenza Whole 09/30/2002, 08/16/2008, 07/16/2010  . Influenza, High Dose Seasonal PF 10/02/2015  . Influenza-Unspecified 08/20/2016  . Pneumococcal Conjugate-13 11/02/2014  . Pneumococcal Polysaccharide-23 10/23/2011  . Tdap 10/23/2011   Screening Tests Health Maintenance  Topic Date Due  . Hepatitis C Screening  July 19, 1946  . FOOT EXAM  01/02/1954  . MAMMOGRAM  01/15/2013  . HEMOGLOBIN A1C  11/13/2016  . INFLUENZA VACCINE  06/18/2017  . OPHTHALMOLOGY EXAM  04/02/2018  . TETANUS/TDAP  10/22/2021  . COLONOSCOPY  03/17/2022  . DEXA SCAN  Completed  . PNA vac Low Risk Adult  Completed      Plan:    Continue doing brain stimulating activities (puzzles, reading, adult coloring books, staying active) to keep memory sharp.   Continue to eat heart healthy diet (full of fruits, vegetables, whole grains, lean protein, water--limit salt, fat, and sugar intake) and increase physical activity as tolerated.  AARP  website for memory games  I have personally reviewed and noted the following in the patient's chart:   . Medical and social history . Use of alcohol, tobacco or illicit drugs  . Current medications and supplements . Functional ability and status . Nutritional status . Physical activity . Advanced directives . List of other physicians . Vitals . Screenings to include cognitive, depression, and falls . Referrals and appointments  In addition, I have reviewed and discussed with patient certain preventive protocols, quality metrics, and best practice recommendations. A written personalized care plan for preventive services as well as general preventive health recommendations were provided to patient.     Michiel Cowboy, RN  05/13/2017

## 2017-05-11 NOTE — Progress Notes (Signed)
Pre visit review using our clinic review tool, if applicable. No additional management support is needed unless otherwise documented below in the visit note. 

## 2017-05-12 ENCOUNTER — Ambulatory Visit: Payer: Medicare HMO | Admitting: Internal Medicine

## 2017-05-13 ENCOUNTER — Encounter: Payer: Self-pay | Admitting: Internal Medicine

## 2017-05-13 ENCOUNTER — Other Ambulatory Visit (INDEPENDENT_AMBULATORY_CARE_PROVIDER_SITE_OTHER): Payer: Medicare HMO

## 2017-05-13 ENCOUNTER — Ambulatory Visit (INDEPENDENT_AMBULATORY_CARE_PROVIDER_SITE_OTHER): Payer: Medicare HMO | Admitting: Internal Medicine

## 2017-05-13 VITALS — BP 126/76 | HR 71 | Temp 98.0°F | Ht 65.0 in | Wt 177.0 lb

## 2017-05-13 DIAGNOSIS — R519 Headache, unspecified: Secondary | ICD-10-CM | POA: Insufficient documentation

## 2017-05-13 DIAGNOSIS — E538 Deficiency of other specified B group vitamins: Secondary | ICD-10-CM

## 2017-05-13 DIAGNOSIS — M797 Fibromyalgia: Secondary | ICD-10-CM | POA: Diagnosis not present

## 2017-05-13 DIAGNOSIS — E119 Type 2 diabetes mellitus without complications: Secondary | ICD-10-CM

## 2017-05-13 DIAGNOSIS — F411 Generalized anxiety disorder: Secondary | ICD-10-CM

## 2017-05-13 DIAGNOSIS — Z0000 Encounter for general adult medical examination without abnormal findings: Secondary | ICD-10-CM | POA: Diagnosis not present

## 2017-05-13 DIAGNOSIS — I10 Essential (primary) hypertension: Secondary | ICD-10-CM

## 2017-05-13 DIAGNOSIS — G44099 Other trigeminal autonomic cephalgias (TAC), not intractable: Secondary | ICD-10-CM

## 2017-05-13 DIAGNOSIS — R51 Headache: Secondary | ICD-10-CM

## 2017-05-13 DIAGNOSIS — E785 Hyperlipidemia, unspecified: Secondary | ICD-10-CM | POA: Diagnosis not present

## 2017-05-13 LAB — HEPATIC FUNCTION PANEL
ALT: 11 U/L (ref 0–35)
AST: 17 U/L (ref 0–37)
Albumin: 4.4 g/dL (ref 3.5–5.2)
Alkaline Phosphatase: 85 U/L (ref 39–117)
BILIRUBIN DIRECT: 0.1 mg/dL (ref 0.0–0.3)
TOTAL PROTEIN: 6.9 g/dL (ref 6.0–8.3)
Total Bilirubin: 0.8 mg/dL (ref 0.2–1.2)

## 2017-05-13 LAB — CBC WITH DIFFERENTIAL/PLATELET
BASOS ABS: 0 10*3/uL (ref 0.0–0.1)
Basophils Relative: 0.5 % (ref 0.0–3.0)
EOS ABS: 0.1 10*3/uL (ref 0.0–0.7)
Eosinophils Relative: 1.8 % (ref 0.0–5.0)
HEMATOCRIT: 44.9 % (ref 36.0–46.0)
Hemoglobin: 14.9 g/dL (ref 12.0–15.0)
LYMPHS PCT: 31.4 % (ref 12.0–46.0)
Lymphs Abs: 2.1 10*3/uL (ref 0.7–4.0)
MCHC: 33.2 g/dL (ref 30.0–36.0)
MCV: 91.3 fl (ref 78.0–100.0)
Monocytes Absolute: 0.3 10*3/uL (ref 0.1–1.0)
Monocytes Relative: 5.2 % (ref 3.0–12.0)
NEUTROS ABS: 4.1 10*3/uL (ref 1.4–7.7)
NEUTROS PCT: 61.1 % (ref 43.0–77.0)
Platelets: 283 10*3/uL (ref 150.0–400.0)
RBC: 4.92 Mil/uL (ref 3.87–5.11)
RDW: 14 % (ref 11.5–15.5)
WBC: 6.7 10*3/uL (ref 4.0–10.5)

## 2017-05-13 LAB — LIPID PANEL
CHOLESTEROL: 209 mg/dL — AB (ref 0–200)
HDL: 47.5 mg/dL (ref >39.00)
LDL Cholesterol: 142 mg/dL — ABNORMAL HIGH (ref 0–99)
NonHDL: 161.23
Total CHOL/HDL Ratio: 4
Triglycerides: 97 mg/dL (ref 0.0–149.0)
VLDL: 19.4 mg/dL (ref 0.0–40.0)

## 2017-05-13 LAB — TSH: TSH: 2.48 u[IU]/mL (ref 0.35–4.50)

## 2017-05-13 LAB — BASIC METABOLIC PANEL
BUN: 18 mg/dL (ref 6–23)
CO2: 29 mEq/L (ref 19–32)
Calcium: 9.6 mg/dL (ref 8.4–10.5)
Chloride: 105 mEq/L (ref 96–112)
Creatinine, Ser: 1.11 mg/dL (ref 0.40–1.20)
GFR: 51.16 mL/min — AB (ref >60.00)
Glucose, Bld: 119 mg/dL — ABNORMAL HIGH (ref 70–99)
POTASSIUM: 4 meq/L (ref 3.5–5.1)
SODIUM: 141 meq/L (ref 135–145)

## 2017-05-13 LAB — HEMOGLOBIN A1C: Hgb A1c MFr Bld: 6.1 % (ref 4.6–6.5)

## 2017-05-13 LAB — SEDIMENTATION RATE: SED RATE: 14 mm/h (ref 0–30)

## 2017-05-13 LAB — VITAMIN B12: Vitamin B-12: 1251 pg/mL — ABNORMAL HIGH (ref 211–911)

## 2017-05-13 LAB — CK: CK TOTAL: 77 U/L (ref 7–177)

## 2017-05-13 MED ORDER — ZOSTER VAC RECOMB ADJUVANTED 50 MCG/0.5ML IM SUSR: 0.5000 mL | Freq: Once | INTRAMUSCULAR | 1 refills | Status: AC

## 2017-05-13 MED ORDER — LIRAGLUTIDE 18 MG/3ML ~~LOC~~ SOPN: 1.2000 mg | PEN_INJECTOR | Freq: Every day | SUBCUTANEOUS | 11 refills | Status: DC

## 2017-05-13 NOTE — Assessment & Plan Note (Signed)
Labs

## 2017-05-13 NOTE — Assessment & Plan Note (Signed)
Labs Metformin 

## 2017-05-13 NOTE — Progress Notes (Signed)
Subjective:  Patient ID: Claudia [name], female    DOB: Mar 04, 1946  Age: 71 y.o. MRN: [id number]  CC: No chief complaint on file.   HPI Claudia [name] presents for a well exam F/u R HA x 1 year w/sinus sx's, FMS, CFS, HTN  Outpatient Medications Prior to Visit  Medication Sig Dispense Refill  . amLODipine (NORVASC) 2.5 MG tablet TAKE 1 TABLET DAILY 90 tablet 3  . cholecalciferol (VITAMIN D) 1000 units tablet Take 1 tablet by mouth daily.    . DULoxetine (CYMBALTA) 60 MG capsule Take 1 capsule (60 mg total) by mouth daily. For depression 90 capsule 3  . gabapentin (NEURONTIN) 600 MG tablet TAKE 1 TABLET THREE TIMES DAILY 270 tablet 3  . losartan (COZAAR) 100 MG tablet Take 1 tablet (100 mg total) by mouth daily. 90 tablet 3  . meloxicam (MOBIC) 15 MG tablet TAKE 1 TABLET EVERY DAY 90 tablet 3  . metFORMIN (GLUCOPHAGE) 500 MG tablet Take 1 tablet (500 mg total) by mouth daily with breakfast. 180 tablet 3  . omeprazole (PRILOSEC) 40 MG capsule TAKE 1 CAPSULE TWICE DAILY 180 capsule 3  . Phosphatidylserine-DHA-EPA (VAYACOG) 100-19.5-6.5 MG CAPS Take 1 capsule by mouth daily. 30 capsule 11   Facility-Administered Medications Prior to Visit  Medication Dose Route Frequency Provider Last Rate Last Dose  . 0.9 %  sodium chloride infusion  500 mL Intravenous Continuous Milus Banister, MD        ROS Review of Systems  Constitutional: Positive for fatigue. Negative for activity change, appetite change, chills and unexpected weight change.  HENT: Positive for sinus pain. Negative for congestion, mouth sores and sinus pressure.   Eyes: Negative for visual disturbance.  Respiratory: Negative for cough and chest tightness.   Gastrointestinal: Negative for abdominal pain and nausea.  Genitourinary: Negative for difficulty urinating, frequency and vaginal pain.  Musculoskeletal: Positive for arthralgias and back pain. Negative for gait problem.  Skin: Negative for pallor and rash.    Neurological: Positive for weakness and headaches. Negative for dizziness, tremors and numbness.  Psychiatric/Behavioral: Positive for dysphoric mood. Negative for confusion and sleep disturbance. The patient is nervous/anxious.     Objective:  BP 126/76 (BP Location: Left Arm, Patient Position: Sitting, Cuff Size: Large)   Pulse 71   Temp 98 F (36.7 C) (Oral)   Ht 5\' 5"  (1.651 m)   Wt 177 lb (80.3 kg)   SpO2 98%   BMI 29.45 kg/m   BP Readings from Last 3 Encounters:  05/13/17 126/76  03/17/17 110/67  03/04/17 130/78    Wt Readings from Last 3 Encounters:  05/13/17 177 lb (80.3 kg)  03/17/17 185 lb (83.9 kg)  03/04/17 185 lb (83.9 kg)    Physical Exam  Constitutional: She appears well-developed. No distress.  HENT:  Head: Normocephalic.  Right Ear: External ear normal.  Left Ear: External ear normal.  Nose: Nose normal.  Mouth/Throat: Oropharynx is clear and moist.  Eyes: Conjunctivae are normal. Pupils are equal, round, and reactive to light. Right eye exhibits no discharge. Left eye exhibits no discharge.  Neck: Normal range of motion. Neck supple. No JVD present. No tracheal deviation present. No thyromegaly present.  Cardiovascular: Normal rate, regular rhythm and normal heart sounds.   Pulmonary/Chest: No stridor. No respiratory distress. She has no wheezes.  Abdominal: Soft. Bowel sounds are normal. She exhibits no distension and no mass. There is no tenderness. There is no rebound and no guarding.  Musculoskeletal: She  exhibits tenderness. She exhibits no edema.  Lymphadenopathy:    She has no cervical adenopathy.  Neurological: She displays normal reflexes. No cranial nerve deficit. She exhibits normal muscle tone. Coordination abnormal.  Skin: No rash noted. No erythema.  Psychiatric: She has a normal mood and affect. Her behavior is normal. Judgment and thought content normal.    Lab Results  Component Value Date   WBC 6.6 09/12/2016   HGB 14.1  09/12/2016   HCT 41.8 09/12/2016   PLT 290.0 09/12/2016   GLUCOSE 90 09/12/2016   CHOL 212 (H) 09/22/2015   TRIG 124.0 09/22/2015   HDL 43.50 09/22/2015   LDLCALC 144 (H) 09/22/2015   ALT 12 09/12/2016   AST 18 09/12/2016   NA 142 09/12/2016   K 5.0 09/12/2016   CL 107 09/12/2016   CREATININE 1.05 09/12/2016   BUN 18 09/12/2016   CO2 30 09/12/2016   TSH 4.06 05/14/2016   INR 1.02 05/26/2015   HGBA1C 6.0 05/14/2016    Dg Bone Density  Result Date: 04/04/2017 Date of study: 04/02/2017 Exam: DUAL X-RAY ABSORPTIOMETRY (DXA) FOR BONE MINERAL DENSITY (BMD) Instrument: [employer] Requesting Provider: PCP Indication: screening for low BMD Comparison: 07/19/2014 Clinical data: Pt is a 71 y.o. female with previous history of fracture (wrist, avulsion fracture of lateral malleolus). On vitamin D. Results:  Lumbar spine (L1-L4) Femoral neck (FN) T-score -1.1 RFN: -0.3 LFN: -0.3 Change in BMD from previous DXA test (%) n/a -4.9%* (*) statistically significant Assessment: the BMD is low according to the [facility] classification for osteoporosis (see below). Fracture risk: moderate FRAX score: 10 year major osteoporotic risk: 12.2%. 10 year hip fracture risk: 1.0%. These are under the thresholds for treatment of 20% and 3%, respectively. 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 Calcium accumulation in arthritic sites can confound the results of the bone density scan. Evaluation for secondary causes should be considered if clinically indicated. Recommend optimizing calcium (1200 mg/day) and vitamin D (800 IU/day) intake. Followup: Repeat BMD is appropriate after 2 years or after 1-2 years if starting treatment. WHO criteria for diagnosis of osteoporosis in postmenopausal women and in men 17 y/o or older: - normal: T-score -1.0 to + 1.0 - osteopenia/low bone density: T-score between -2.5 and -1.0 - osteoporosis: T-score below -2.5 - severe  osteoporosis: T-score below -2.5 with history of fragility fracture Note: although not part of the WHO classification, the presence of a fragility fracture, regardless of the T-score, should be considered diagnostic of osteoporosis, provided other causes for the fracture have been excluded. Treatment: The National Osteoporosis Foundation recommends that treatment be considered in postmenopausal women and men age 37 or older with: 1. Hip or vertebral (clinical or morphometric) fracture 2. T-score of - 2.5 or lower at the spine or hip 3. 10-year fracture probability by FRAX of at least 20% for a major osteoporotic fracture and 3% for a hip fracture Philemon Kingdom, MD [facility]    Assessment & Plan:   There are no diagnoses linked to this encounter. I am having Claudia [name] maintain her Phosphatidylserine-DHA-EPA, metFORMIN, meloxicam, amLODipine, omeprazole, gabapentin, cholecalciferol, DULoxetine, and losartan. We will continue to administer sodium chloride.  No orders of the defined types were placed in this encounter.    Follow-up: No Follow-up on file.  Walker Kehr, MD

## 2017-05-13 NOTE — Assessment & Plan Note (Signed)
On Losartan, Norvasc

## 2017-05-13 NOTE — Assessment & Plan Note (Signed)
Here for medicare wellness/physical  Diet: heart healthy  Physical activity: not sedentary  Depression/mood screen: negative  Hearing: intact to whispered voice  Visual acuity: grossly normal, performs annual eye exam  ADLs: capable  Fall risk: low to none  Home safety: good  Cognitive evaluation: intact to orientation, naming, recall and repetition  EOL planning: adv directives, full code/ I agree  I have personally reviewed and have noted  1. The patient's medical, surgical and social history  2. Their use of alcohol, tobacco or illicit drugs  3. Their current medications and supplements  4. The patient's functional ability including ADL's, fall risks, home safety risks and hearing or visual impairment.  5. Diet and physical activities  6. Evidence for depression or mood disorders - yes 7. The roster of all physicians providing medical care to patient - is listed in the Snapshot section of the chart and reviewed today.    Today patient counseled on age appropriate routine health concerns for screening and prevention, each reviewed and up to date or declined. Immunizations reviewed and up to date or declined. Labs ordered and reviewed. Risk factors for depression reviewed and negative. Hearing function and visual acuity are intact. ADLs screened and addressed as needed. Functional ability and level of safety reviewed and appropriate. Education, counseling and referrals performed based on assessed risks today. Patient provided with a copy of personalized plan for preventive services.

## 2017-05-13 NOTE — Assessment & Plan Note (Signed)
R HA x 1 year w/sinus sx's CT sinuses

## 2017-05-13 NOTE — Patient Instructions (Addendum)
Continue doing brain stimulating activities (puzzles, reading, adult coloring books, staying active) to keep memory sharp.   Continue to eat heart healthy diet (full of fruits, vegetables, whole grains, lean protein, water--limit salt, fat, and sugar intake) and increase physical activity as tolerated.  AARP website for memory games   Ms. Cura , Thank you for taking time to come for your Medicare Wellness Visit. I appreciate your ongoing commitment to your health goals. Please review the following plan we discussed and let me know if I can assist you in the future.   These are the goals we discussed: Goals    . <enter goal here>    . Enjoy my grand-daughter as much as possible       This is a list of the screening recommended for you and due dates:  Health Maintenance  Topic Date Due  .  Hepatitis C: One time screening is recommended by Center for Disease Control  (CDC) for  adults born from [birth year] through 1967.   [birth year]  . Complete foot exam   01/02/1954  . Mammogram  01/15/2013  . Hemoglobin A1C  11/13/2016  . Flu Shot  06/18/2017  . Eye exam for diabetics  04/02/2018  . Tetanus Vaccine  10/22/2021  . Colon Cancer Screening  03/17/2022  . DEXA scan (bone density measurement)  Completed  . Pneumonia vaccines  Completed

## 2017-05-15 ENCOUNTER — Ambulatory Visit (INDEPENDENT_AMBULATORY_CARE_PROVIDER_SITE_OTHER)
Admission: RE | Admit: 2017-05-15 | Discharge: 2017-05-15 | Disposition: A | Discharge status: Home / Self Care | Payer: Medicare HMO | Source: Ambulatory Visit | Attending: Internal Medicine | Admitting: Internal Medicine

## 2017-05-15 DIAGNOSIS — G44099 Other trigeminal autonomic cephalgias (TAC), not intractable: Secondary | ICD-10-CM | POA: Diagnosis not present

## 2017-05-15 DIAGNOSIS — J329 Chronic sinusitis, unspecified: Secondary | ICD-10-CM | POA: Diagnosis not present

## 2017-06-09 ENCOUNTER — Other Ambulatory Visit: Payer: Self-pay | Admitting: Internal Medicine

## 2017-08-07 ENCOUNTER — Encounter: Payer: Self-pay | Admitting: Internal Medicine

## 2017-08-07 ENCOUNTER — Ambulatory Visit (INDEPENDENT_AMBULATORY_CARE_PROVIDER_SITE_OTHER): Payer: Medicare HMO | Admitting: Internal Medicine

## 2017-08-07 DIAGNOSIS — G44099 Other trigeminal autonomic cephalgias (TAC), not intractable: Secondary | ICD-10-CM | POA: Diagnosis not present

## 2017-08-07 DIAGNOSIS — R21 Rash and other nonspecific skin eruption: Secondary | ICD-10-CM | POA: Diagnosis not present

## 2017-08-07 DIAGNOSIS — G8929 Other chronic pain: Secondary | ICD-10-CM | POA: Diagnosis not present

## 2017-08-07 DIAGNOSIS — E538 Deficiency of other specified B group vitamins: Secondary | ICD-10-CM | POA: Diagnosis not present

## 2017-08-07 DIAGNOSIS — M25571 Pain in right ankle and joints of right foot: Secondary | ICD-10-CM | POA: Diagnosis not present

## 2017-08-07 DIAGNOSIS — E119 Type 2 diabetes mellitus without complications: Secondary | ICD-10-CM | POA: Diagnosis not present

## 2017-08-07 DIAGNOSIS — I10 Essential (primary) hypertension: Secondary | ICD-10-CM | POA: Diagnosis not present

## 2017-08-07 MED ORDER — ZOSTER VAC RECOMB ADJUVANTED 50 MCG/0.5ML IM SUSR: 0.5000 mL | Freq: Once | INTRAMUSCULAR | 1 refills | Status: AC

## 2017-08-07 NOTE — Assessment & Plan Note (Signed)
On B12 

## 2017-08-07 NOTE — Progress Notes (Signed)
Subjective:  Patient ID: Claudia [name], female    DOB: December 08, 1945  Age: 71 y.o. MRN: [id number]  CC: No chief complaint on file.   HPI Claudia [name] presents for a 3 mo f/u C/o fire ant bites B feet C/o R side of the head throbbing at times F/u HTN, DM  Outpatient Medications Prior to Visit  Medication Sig Dispense Refill  . amLODipine (NORVASC) 2.5 MG tablet TAKE 1 TABLET EVERY DAY 90 tablet 3  . cholecalciferol (VITAMIN D) 1000 units tablet Take 1 tablet by mouth daily.    . DULoxetine (CYMBALTA) 60 MG capsule Take 1 capsule (60 mg total) by mouth daily. For depression 90 capsule 3  . gabapentin (NEURONTIN) 600 MG tablet TAKE 1 TABLET THREE TIMES DAILY 270 tablet 3  . liraglutide (VICTOZA) 18 MG/3ML SOPN Inject 0.2 mLs (1.2 mg total) into the skin daily. 1 pen 11  . losartan (COZAAR) 100 MG tablet Take 1 tablet (100 mg total) by mouth daily. 90 tablet 3  . meloxicam (MOBIC) 15 MG tablet TAKE 1 TABLET EVERY DAY 90 tablet 3  . metFORMIN (GLUCOPHAGE) 500 MG tablet Take 1 tablet (500 mg total) by mouth daily with breakfast. 180 tablet 3  . omeprazole (PRILOSEC) 40 MG capsule TAKE 1 CAPSULE TWICE DAILY 180 capsule 3  . Phosphatidylserine-DHA-EPA (VAYACOG) 100-19.5-6.5 MG CAPS Take 1 capsule by mouth daily. 30 capsule 11   Facility-Administered Medications Prior to Visit  Medication Dose Route Frequency Provider Last Rate Last Dose  . 0.9 %  sodium chloride infusion  500 mL Intravenous Continuous Milus Banister, MD        ROS Review of Systems  Constitutional: Negative for activity change, appetite change, chills, fatigue and unexpected weight change.  HENT: Negative for congestion, mouth sores and sinus pressure.   Eyes: Positive for pain. Negative for visual disturbance.  Respiratory: Negative for cough and chest tightness.   Gastrointestinal: Negative for abdominal pain and nausea.  Genitourinary: Negative for difficulty urinating, frequency and vaginal pain.    Musculoskeletal: Negative for back pain and gait problem.  Skin: Positive for rash. Negative for pallor.  Neurological: Positive for headaches. Negative for dizziness, tremors, weakness and numbness.  Psychiatric/Behavioral: Positive for decreased concentration. Negative for confusion and sleep disturbance. The patient is nervous/anxious.     Objective:  BP 126/72 (BP Location: Left Arm, Patient Position: Sitting, Cuff Size: Large)   Pulse 64   Temp 98.4 F (36.9 C) (Oral)   Ht 5\' 5"  (1.651 m)   Wt 177 lb (80.3 kg)   SpO2 98%   BMI 29.45 kg/m   BP Readings from Last 3 Encounters:  08/07/17 126/72  05/13/17 126/76  03/17/17 110/67    Wt Readings from Last 3 Encounters:  08/07/17 177 lb (80.3 kg)  05/13/17 177 lb (80.3 kg)  03/17/17 185 lb (83.9 kg)    Physical Exam  Constitutional: She appears well-developed. No distress.  HENT:  Head: Normocephalic.  Right Ear: External ear normal.  Left Ear: External ear normal.  Nose: Nose normal.  Mouth/Throat: Oropharynx is clear and moist.  Eyes: Pupils are equal, round, and reactive to light. Conjunctivae are normal. Right eye exhibits no discharge. Left eye exhibits no discharge.  Neck: Normal range of motion. Neck supple. No JVD present. No tracheal deviation present. No thyromegaly present.  Cardiovascular: Normal rate, regular rhythm and normal heart sounds.   Pulmonary/Chest: No stridor. No respiratory distress. She has no wheezes.  Abdominal: Soft. Bowel sounds  are normal. She exhibits no distension and no mass. There is no tenderness. There is no rebound and no guarding.  Musculoskeletal: She exhibits tenderness. She exhibits no edema.  Lymphadenopathy:    She has no cervical adenopathy.  Neurological: She displays normal reflexes. No cranial nerve deficit. She exhibits normal muscle tone. Coordination normal.  Skin: Rash noted. No erythema.  Psychiatric: She has a normal mood and affect. Her behavior is normal. Judgment  and thought content normal.  Head NT Rash on B ankles healing R ankle w/valgus deformity  Lab Results  Component Value Date   WBC 6.7 05/13/2017   HGB 14.9 05/13/2017   HCT 44.9 05/13/2017   PLT 283.0 05/13/2017   GLUCOSE 119 (H) 05/13/2017   CHOL 209 (H) 05/13/2017   TRIG 97.0 05/13/2017   HDL 47.50 05/13/2017   LDLCALC 142 (H) 05/13/2017   ALT 11 05/13/2017   AST 17 05/13/2017   NA 141 05/13/2017   K 4.0 05/13/2017   CL 105 05/13/2017   CREATININE 1.11 05/13/2017   BUN 18 05/13/2017   CO2 29 05/13/2017   TSH 2.48 05/13/2017   INR 1.02 05/26/2015   HGBA1C 6.1 05/13/2017    Ct Maxillofacial Ltd Wo Cm  Result Date: 05/15/2017 CLINICAL DATA:  71 y/o  F; right maxillary sinus pressure. EXAM: CT PARANASAL SINUS LIMITED WITHOUT CONTRAST TECHNIQUE: Non-contiguous multidetector CT images of the paranasal sinuses were obtained in a single plane without contrast. COMPARISON:  06/05/2005 MRI of the head. FINDINGS: Frontal sinus: Normally aerated. Patent frontal sinus drainage pathways. Ethmoid sinus: Normally aerated. Maxillary sinuses:  Normally aerated. Sphenoid sinus:  Normally aerated. Nasal passages: Patent. Intact nasal septum.  Right concha bullosa. IMPRESSION: Normally aerated paranasal sinuses. No significant paranasal sinus disease. Electronically Signed   By: Kristine Garbe M.D.   On: 05/15/2017 23:25    Assessment & Plan:   There are no diagnoses linked to this encounter. I have discontinued Ms. Kawasaki's Phosphatidylserine-DHA-EPA. I am also having her maintain her metFORMIN, cholecalciferol, DULoxetine, losartan, liraglutide, omeprazole, meloxicam, amLODipine, and gabapentin. We will continue to administer sodium chloride.  No orders of the defined types were placed in this encounter.    Follow-up: No Follow-up on file.  Walker Kehr, MD

## 2017-08-07 NOTE — Assessment & Plan Note (Signed)
Losartan, Norvasc 

## 2017-08-07 NOTE — Assessment & Plan Note (Signed)
valgus deformity - surgery was adviced

## 2017-08-07 NOTE — Assessment & Plan Note (Signed)
fire ant bites B feet OTC HCT cream

## 2017-08-07 NOTE — Assessment & Plan Note (Signed)
R HA x 1 year w/sinus sx's Sinus CT WNL Neurol ref offered - declined NSAID prn

## 2017-08-07 NOTE — Assessment & Plan Note (Signed)
Metformin Re-start Victoza

## 2017-08-13 ENCOUNTER — Ambulatory Visit: Payer: Medicare HMO | Admitting: Internal Medicine

## 2017-08-14 ENCOUNTER — Ambulatory Visit: Payer: Medicare HMO | Admitting: Internal Medicine

## 2017-09-04 ENCOUNTER — Telehealth: Payer: Self-pay | Admitting: Internal Medicine

## 2017-09-04 DIAGNOSIS — M79673 Pain in unspecified foot: Secondary | ICD-10-CM

## 2017-09-04 NOTE — Telephone Encounter (Signed)
Please advise 

## 2017-09-04 NOTE — Telephone Encounter (Signed)
Pt called and is scared of her foot surgery because it is so invasive, she has no scheduled the surgery yet, she would like a second opinion and would like to be referred to   [facility], [place], [place] [address] Phone Number [phone] Would like to see Dr. Henrene Hawking Daws Please advise, Pt notified of Plots absence

## 2017-09-05 ENCOUNTER — Other Ambulatory Visit: Payer: Self-pay

## 2017-09-05 MED ORDER — DULOXETINE HCL 60 MG PO CPEP: 60.0000 mg | ORAL_CAPSULE | Freq: Every day | ORAL | 3 refills | Status: DC

## 2017-09-05 MED ORDER — METFORMIN HCL 500 MG PO TABS
500.0000 mg | ORAL_TABLET | Freq: Every day | ORAL | 3 refills | Status: DC
Start: 2017-09-05 — End: 2018-11-06

## 2017-09-07 NOTE — Telephone Encounter (Signed)
Will refer Thx

## 2017-09-16 DIAGNOSIS — M2141 Flat foot [pes planus] (acquired), right foot: Secondary | ICD-10-CM | POA: Diagnosis not present

## 2017-09-16 DIAGNOSIS — M2140 Flat foot [pes planus] (acquired), unspecified foot: Secondary | ICD-10-CM | POA: Diagnosis not present

## 2017-10-06 ENCOUNTER — Telehealth: Payer: Self-pay | Admitting: Internal Medicine

## 2017-10-06 MED ORDER — AMOXICILLIN 500 MG PO CAPS: 2000.0000 mg | ORAL_CAPSULE | Freq: Every day | ORAL | 1 refills | Status: DC | PRN

## 2017-10-06 NOTE — Telephone Encounter (Signed)
Patient has teeth cleaning coming up.  Patient states she has to have antibiotic before appt.  Is requesting script to be sent to [facility]. Is also requesting to start B12 injection again.  Patient states she will discuss at next visit in [place].

## 2017-10-14 ENCOUNTER — Telehealth: Payer: Self-pay | Admitting: Internal Medicine

## 2017-10-14 NOTE — Telephone Encounter (Signed)
Please advise have you seen this?

## 2017-10-14 NOTE — Telephone Encounter (Signed)
Spoke with Tonette Bihari from [facility] who sent fax regarding statin for the pt. She was following up on previously sent Fax.

## 2017-10-15 NOTE — Telephone Encounter (Signed)
If provider wanted pt on statin, pt would be on statin or if pt wanted to be put on one they would have discussed at an OV.

## 2017-10-22 NOTE — Telephone Encounter (Signed)
error 

## 2017-11-06 ENCOUNTER — Ambulatory Visit: Payer: Medicare HMO | Admitting: Internal Medicine

## 2017-11-06 ENCOUNTER — Encounter: Payer: Self-pay | Admitting: Internal Medicine

## 2017-11-06 DIAGNOSIS — E538 Deficiency of other specified B group vitamins: Secondary | ICD-10-CM

## 2017-11-06 DIAGNOSIS — R202 Paresthesia of skin: Secondary | ICD-10-CM

## 2017-11-06 DIAGNOSIS — J069 Acute upper respiratory infection, unspecified: Secondary | ICD-10-CM | POA: Diagnosis not present

## 2017-11-06 MED ORDER — AZITHROMYCIN 250 MG PO TABS: ORAL_TABLET | ORAL | 0 refills | Status: DC

## 2017-11-06 MED ORDER — CYANOCOBALAMIN 1000 MCG/ML IJ SOLN: 1000.0000 ug | INTRAMUSCULAR | 6 refills | Status: DC

## 2017-11-06 NOTE — Assessment & Plan Note (Signed)
fall in Nov (saw a doctor for it)- R hand hurts; goes numb when cooking - ?CTS  Vit B complex Wrist splint

## 2017-11-06 NOTE — Patient Instructions (Signed)
Vit B complex Wrist splint

## 2017-11-06 NOTE — Assessment & Plan Note (Signed)
Switch to B12 shots

## 2017-11-06 NOTE — Progress Notes (Signed)
Subjective:  Patient ID: Claudia [name], female    DOB: May 09, 1946  Age: 71 y.o. MRN: [id number]  CC: No chief complaint on file.   HPI Claudia [name] presents for URI sx's x few days, cough C/o fall in Nov (saw a doctor for it)- R hand hurts; goes numb when cooking  Outpatient Medications Prior to Visit  Medication Sig Dispense Refill  . amLODipine (NORVASC) 2.5 MG tablet TAKE 1 TABLET EVERY DAY 90 tablet 3  . amoxicillin (AMOXIL) 500 MG capsule Take 4 capsules (2,000 mg total) daily as needed by mouth (take 1 hr prior to dental cleaning). 20 capsule 1  . cholecalciferol (VITAMIN D) 1000 units tablet Take 1 tablet by mouth daily.    . DULoxetine (CYMBALTA) 60 MG capsule Take 1 capsule (60 mg total) by mouth daily. For depression 90 capsule 3  . gabapentin (NEURONTIN) 600 MG tablet TAKE 1 TABLET THREE TIMES DAILY 270 tablet 3  . liraglutide (VICTOZA) 18 MG/3ML SOPN Inject 0.2 mLs (1.2 mg total) into the skin daily. 1 pen 11  . losartan (COZAAR) 100 MG tablet Take 1 tablet (100 mg total) by mouth daily. 90 tablet 3  . meloxicam (MOBIC) 15 MG tablet TAKE 1 TABLET EVERY DAY 90 tablet 3  . metFORMIN (GLUCOPHAGE) 500 MG tablet Take 1 tablet (500 mg total) by mouth daily with breakfast. 180 tablet 3  . omeprazole (PRILOSEC) 40 MG capsule TAKE 1 CAPSULE TWICE DAILY 180 capsule 3   Facility-Administered Medications Prior to Visit  Medication Dose Route Frequency Provider Last Rate Last Dose  . 0.9 %  sodium chloride infusion  500 mL Intravenous Continuous Milus Banister, MD        ROS Review of Systems  Constitutional: Positive for chills and fatigue. Negative for activity change, appetite change and unexpected weight change.  HENT: Positive for congestion, rhinorrhea and sore throat. Negative for mouth sores and sinus pressure.   Eyes: Negative for visual disturbance.  Respiratory: Negative for cough and chest tightness.   Gastrointestinal: Negative for abdominal pain and nausea.    Genitourinary: Negative for difficulty urinating, frequency and vaginal pain.  Musculoskeletal: Positive for arthralgias and back pain. Negative for gait problem.  Skin: Negative for pallor and rash.  Neurological: Negative for dizziness, tremors, weakness, numbness and headaches.  Psychiatric/Behavioral: Negative for confusion and sleep disturbance.  HR 60s  Objective:  There were no vitals taken for this visit.  BP Readings from Last 3 Encounters:  08/07/17 126/72  05/13/17 126/76  03/17/17 110/67    Wt Readings from Last 3 Encounters:  08/07/17 177 lb (80.3 kg)  05/13/17 177 lb (80.3 kg)  03/17/17 185 lb (83.9 kg)    Physical Exam  Constitutional: She appears well-developed. No distress.  HENT:  Head: Normocephalic.  Right Ear: External ear normal.  Left Ear: External ear normal.  Nose: Nose normal.  Mouth/Throat: Oropharynx is clear and moist.  Eyes: Conjunctivae are normal. Pupils are equal, round, and reactive to light. Right eye exhibits no discharge. Left eye exhibits no discharge.  Neck: Normal range of motion. Neck supple. No JVD present. No tracheal deviation present. No thyromegaly present.  Cardiovascular: Normal rate, regular rhythm and normal heart sounds.  Pulmonary/Chest: No stridor. No respiratory distress. She has no wheezes.  Abdominal: Soft. Bowel sounds are normal. She exhibits no distension and no mass. There is tenderness. There is no rebound and no guarding.  Musculoskeletal: She exhibits tenderness. She exhibits no edema.  Lymphadenopathy:  She has no cervical adenopathy.  Neurological: She displays normal reflexes. No cranial nerve deficit. She exhibits normal muscle tone. Coordination normal.  Skin: No rash noted. No erythema.  Psychiatric: She has a normal mood and affect. Her behavior is normal. Judgment and thought content normal.  eryth throat CTS signs (-) B  Lab Results  Component Value Date   WBC 6.7 05/13/2017   HGB 14.9  05/13/2017   HCT 44.9 05/13/2017   PLT 283.0 05/13/2017   GLUCOSE 119 (H) 05/13/2017   CHOL 209 (H) 05/13/2017   TRIG 97.0 05/13/2017   HDL 47.50 05/13/2017   LDLCALC 142 (H) 05/13/2017   ALT 11 05/13/2017   AST 17 05/13/2017   NA 141 05/13/2017   K 4.0 05/13/2017   CL 105 05/13/2017   CREATININE 1.11 05/13/2017   BUN 18 05/13/2017   CO2 29 05/13/2017   TSH 2.48 05/13/2017   INR 1.02 05/26/2015   HGBA1C 6.1 05/13/2017    Ct Maxillofacial Ltd Wo Cm  Result Date: 05/15/2017 CLINICAL DATA:  71 y/o  F; right maxillary sinus pressure. EXAM: CT PARANASAL SINUS LIMITED WITHOUT CONTRAST TECHNIQUE: Non-contiguous multidetector CT images of the paranasal sinuses were obtained in a single plane without contrast. COMPARISON:  06/05/2005 MRI of the head. FINDINGS: Frontal sinus: Normally aerated. Patent frontal sinus drainage pathways. Ethmoid sinus: Normally aerated. Maxillary sinuses:  Normally aerated. Sphenoid sinus:  Normally aerated. Nasal passages: Patent. Intact nasal septum.  Right concha bullosa. IMPRESSION: Normally aerated paranasal sinuses. No significant paranasal sinus disease. Electronically Signed   By: Kristine Garbe M.D.   On: 05/15/2017 23:25    Assessment & Plan:   There are no diagnoses linked to this encounter. I am having Claudia [name] maintain her cholecalciferol, losartan, liraglutide, omeprazole, meloxicam, amLODipine, gabapentin, metFORMIN, DULoxetine, and amoxicillin. We will continue to administer sodium chloride.  No orders of the defined types were placed in this encounter.    Follow-up: No Follow-up on file.  Walker Kehr, MD

## 2017-11-06 NOTE — Assessment & Plan Note (Signed)
Zpac 

## 2017-11-28 ENCOUNTER — Telehealth: Payer: Self-pay | Admitting: Internal Medicine

## 2017-11-28 NOTE — Telephone Encounter (Signed)
I am sorry Dr. Betty Martinique, Dr. Reola Calkins CMA is [place]. I hit the wrong one.

## 2017-11-28 NOTE — Telephone Encounter (Signed)
Copied from [place] [phone]. Topic: Referral - Request >> Nov 28, 2017 10:12 AM Conception Chancy, NT wrote: Reason for CRM: patient is requesting a referral to a ENT. Still having problems with her sinuses

## 2017-11-28 NOTE — Telephone Encounter (Signed)
Copied from [place] [id number]. Topic: Quick Communication - See Telephone Encounter >> Nov 28, 2017 10:11 AM Conception Chancy, NT wrote: CRM for notification. See Telephone encounter for:  11/28/17.  Patient is calling stating she received a letter in the mail from [facility] and her Losartan 100mg  has been recalled. She is needing a new RX sent in for that to [facility]. Please advise.

## 2017-11-28 NOTE — Telephone Encounter (Signed)
She needs to call her pharmacy and find out if her Losartan was affected by contamination. Her pharmacy could replace her Losartan for a new one. She doe snot need to change to another agent. A Rx for 30 months supply can be sent to a local pharmacy.  In regard to ENT referral ,she can discuss this with her PCP.  Thanks, BJ

## 2017-12-01 ENCOUNTER — Ambulatory Visit: Payer: Self-pay | Admitting: *Deleted

## 2017-12-01 MED ORDER — OLMESARTAN MEDOXOMIL 20 MG PO TABS: 20.0000 mg | ORAL_TABLET | Freq: Every day | ORAL | 3 refills | Status: DC

## 2017-12-01 NOTE — Telephone Encounter (Signed)
Called in c/o a headache on the right side of her head and eye.  It was difficult to triage this pt due to her jumping around with her explanations, talking very fast and changing which side of her head was involved.    She informed me she has memory problems and is in an Alzheimer's study.   She apologies for "confusing me"   I had to stop her and get her refocused.    She went on to explain that she has had many falls over the years and hit her head due to accidents.    She said she wanted a referral to an ENT doctor for her sinuses.   I told her she should need to see Dr. Alain Marion to get a referral and to be evaluated for the headache and eye pain she is experiencing. She then told me about falling over a trash can at the surgical center.   She said she didn't pass out but she don't know what she hit her head on.   She then told me she has a terrible time with her memory.  I called the flow coordinator and told her of the situation.   She was able to get her an appt with Dr. Alain Marion for tomorrow 12/02/17 at 2:30. I made the pt aware of this.   She verbalized she would lbe at the appt. Reason for Disposition . [1] After 72 hours AND [2] headache persists  Answer Assessment - Initial Assessment Questions 1. MECHANISM: "How did the injury happen?" For falls, ask: "What height did you fall from?" and "What surface did you fall against?"      I fell back in Nov over a trash can at the surgery center and hit my head on the right side.  I don't know where I hit my head when I fell.   I've hit my head 3 times on the right side over the last 3 yrs.    I didn't pass out.   2. ONSET: "When did the injury happen?" (Minutes or hours ago)      In November 3. NEUROLOGIC SYMPTOMS: "Was there any loss of consciousness?" "Are there any other neurological symptoms?"      No loss of consciousness.   Bruised both knees and skinned elbow. 4. MENTAL STATUS: "Does the person know who he is, who you are, and where he  is?"      I'm trouble with my memory 5. LOCATION: "What part of the head was hit?"      Right side 6. SCALP APPEARANCE: "What does the scalp look like? Is it bleeding now?" If so, ask: "Is it difficult to stop?"      No open area. 7. SIZE: For cuts, bruises, or swelling, ask: "How large is it?" (e.g., inches or centimeters)      Just my knees and right arm. 8. PAIN: "Is there any pain?" If so, ask: "How bad is it?"  (e.g., Scale 1-10; or mild, moderate, severe)     Headaches 9. TETANUS: For any breaks in the skin, ask: "When was the last tetanus booster?"     No open skin 10. OTHER SYMPTOMS: "Do you have any other symptoms?" (e.g., neck pain, vomiting)       No neck pain, nausea today 11. PREGNANCY: "Is there any chance you are pregnant?" "When was your last menstrual period?"       *No Answer*  Protocols used: HEAD INJURY-A-AH

## 2017-12-01 NOTE — Telephone Encounter (Signed)
Benicar Rx emailed Thx

## 2017-12-01 NOTE — Telephone Encounter (Signed)
Please advise about referral and Losartan recall.

## 2017-12-02 ENCOUNTER — Encounter: Payer: Self-pay | Admitting: Internal Medicine

## 2017-12-02 ENCOUNTER — Ambulatory Visit (INDEPENDENT_AMBULATORY_CARE_PROVIDER_SITE_OTHER): Payer: Medicare HMO | Admitting: Internal Medicine

## 2017-12-02 VITALS — BP 144/88 | HR 77 | Temp 98.4°F | Ht 65.0 in | Wt 178.0 lb

## 2017-12-02 DIAGNOSIS — J329 Chronic sinusitis, unspecified: Secondary | ICD-10-CM | POA: Insufficient documentation

## 2017-12-02 DIAGNOSIS — G44099 Other trigeminal autonomic cephalgias (TAC), not intractable: Secondary | ICD-10-CM | POA: Diagnosis not present

## 2017-12-02 DIAGNOSIS — M65311 Trigger thumb, right thumb: Secondary | ICD-10-CM

## 2017-12-02 MED ORDER — METHYLPREDNISOLONE ACETATE 40 MG/ML IJ SUSP
40.0000 mg | Freq: Once | INTRAMUSCULAR | Status: AC
  Administered 2017-12-02: 40 mg

## 2017-12-02 MED ORDER — METHYLPREDNISOLONE ACETATE 40 MG/ML IJ SUSP
10.0000 mg | Freq: Once | INTRAMUSCULAR | Status: AC
  Administered 2017-12-02: 10 mg via INTRA_ARTICULAR

## 2017-12-02 NOTE — Progress Notes (Signed)
Subjective:  Patient ID: Claudia ZOBRIST, female    DOB: [birth year]  Age: 72 y.o. MRN: [id number]  CC: No chief complaint on file.   HPI Claudia [name] presents for R HA - not better. C/o runny nose. She had a lot of mucus after using a netty pot. Z pack did not help.. C/o R thumb triggering/pain  Outpatient Medications Prior to Visit  Medication Sig Dispense Refill  . amLODipine (NORVASC) 2.5 MG tablet TAKE 1 TABLET EVERY DAY 90 tablet 3  . amoxicillin (AMOXIL) 500 MG capsule Take 4 capsules (2,000 mg total) daily as needed by mouth (take 1 hr prior to dental cleaning). 20 capsule 1  . azithromycin (ZITHROMAX Z-PAK) 250 MG tablet As directed 6 each 0  . cholecalciferol (VITAMIN D) 1000 units tablet Take 1 tablet by mouth daily.    . cyanocobalamin (,VITAMIN B-12,) 1000 MCG/ML injection Inject 1 mL (1,000 mcg total) into the skin every 30 (thirty) days. 10 mL 6  . DULoxetine (CYMBALTA) 60 MG capsule Take 1 capsule (60 mg total) by mouth daily. For depression 90 capsule 3  . gabapentin (NEURONTIN) 600 MG tablet TAKE 1 TABLET THREE TIMES DAILY 270 tablet 3  . liraglutide (VICTOZA) 18 MG/3ML SOPN Inject 0.2 mLs (1.2 mg total) into the skin daily. 1 pen 11  . meloxicam (MOBIC) 15 MG tablet TAKE 1 TABLET EVERY DAY 90 tablet 3  . metFORMIN (GLUCOPHAGE) 500 MG tablet Take 1 tablet (500 mg total) by mouth daily with breakfast. 180 tablet 3  . olmesartan (BENICAR) 20 MG tablet Take 1 tablet (20 mg total) by mouth daily. 90 tablet 3  . omeprazole (PRILOSEC) 40 MG capsule TAKE 1 CAPSULE TWICE DAILY 180 capsule 3   Facility-Administered Medications Prior to Visit  Medication Dose Route Frequency Provider Last Rate Last Dose  . 0.9 %  sodium chloride infusion  500 mL Intravenous Continuous Milus Banister, MD        ROS Review of Systems  Constitutional: Negative for activity change, appetite change, chills, fatigue and unexpected weight change.  HENT: Positive for congestion, postnasal drip,  rhinorrhea, sinus pressure and sinus pain. Negative for mouth sores.   Eyes: Negative for visual disturbance.  Respiratory: Negative for cough and chest tightness.   Gastrointestinal: Negative for abdominal pain and nausea.  Genitourinary: Negative for difficulty urinating, frequency and vaginal pain.  Musculoskeletal: Negative for back pain and gait problem.  Skin: Negative for pallor and rash.  Neurological: Positive for headaches. Negative for dizziness, tremors, weakness and numbness.  Psychiatric/Behavioral: Negative for confusion and sleep disturbance.    Objective:  BP (!) 144/88 (BP Location: Left Arm, Patient Position: Sitting, Cuff Size: Normal)   Pulse 77   Temp 98.4 F (36.9 C) (Oral)   Ht 5\' 5"  (1.651 m)   Wt 178 lb (80.7 kg)   SpO2 98%   BMI 29.62 kg/m   BP Readings from Last 3 Encounters:  12/02/17 (!) 144/88  08/07/17 126/72  05/13/17 126/76    Wt Readings from Last 3 Encounters:  12/02/17 178 lb (80.7 kg)  08/07/17 177 lb (80.3 kg)  05/13/17 177 lb (80.3 kg)    Physical Exam  Constitutional: She appears well-developed. No distress.  HENT:  Head: Normocephalic.  Right Ear: External ear normal.  Left Ear: External ear normal.  Nose: Nose normal.  Mouth/Throat: Oropharynx is clear and moist.  Eyes: Conjunctivae are normal. Pupils are equal, round, and reactive to light. Right eye exhibits no discharge.  Left eye exhibits no discharge.  Neck: Normal range of motion. Neck supple. No JVD present. No tracheal deviation present. No thyromegaly present.  Cardiovascular: Normal rate, regular rhythm and normal heart sounds.  Pulmonary/Chest: No stridor. No respiratory distress. She has no wheezes.  Abdominal: Soft. Bowel sounds are normal. She exhibits no distension and no mass. There is no tenderness. There is no rebound and no guarding.  Musculoskeletal: She exhibits no edema or tenderness.  Lymphadenopathy:    She has no cervical adenopathy.  Neurological:  She displays normal reflexes. No cranial nerve deficit. She exhibits normal muscle tone. Coordination normal.  Skin: No rash noted. No erythema.  Psychiatric: She has a normal mood and affect. Her behavior is normal. Judgment and thought content normal.  eryth nasal mucosa  R thumb is triggering    Procedure Note :    Trigger finger tendon Injection:   Indication : Trigger thumb R   Risks including unsuccessful procedure , bleeding, infection, bruising, skin atrophy and others were explained to the patient in detail as well as the benefits. Informed consent was obtained and signed.   Tthe patient was placed in a comfortable position.    Flexor digitorum tendon was marked and  the skin was prepped with Betadine and alcohol. 1 inch 25-gauge needle was used. The needle was advanced  into the skin down to tendon. It  was injected with 0.5 mL of 2% lidocaine and 10 mg of Depo-Medrol in a usual fashion.  Band-Aids applied. Coban wrap   Tolerated well. Complications: None. Good pain relief following the procedure.    Procedure Note :     R occipital nerve block Injection:   Indication :  occipital neuralgia.   Risks including unsuccessful procedure , bleeding, infection, bruising, skin atrophy and others were explained to the patient in detail as well as the benefits. Informed consent was obtained and signed.   Tthe patient was placed in a comfortable position.  occip nerve exit point was marked and  the skin was prepped with Betadine and alcohol. 11/2 inch 25-gauge needle was used. The needle was advanced perpendicular to the skin and I injected the site with 2 mL of 2% lidocaine and 40 mg of Depo-Medrol in a usual fashion.     Tolerated well. Complications: None. Good pain relief following the procedure.   Lab Results  Component Value Date   WBC 6.7 05/13/2017   HGB 14.9 05/13/2017   HCT 44.9 05/13/2017   PLT 283.0 05/13/2017   GLUCOSE 119 (H) 05/13/2017   CHOL 209 (H) 05/13/2017     TRIG 97.0 05/13/2017   HDL 47.50 05/13/2017   LDLCALC 142 (H) 05/13/2017   ALT 11 05/13/2017   AST 17 05/13/2017   NA 141 05/13/2017   K 4.0 05/13/2017   CL 105 05/13/2017   CREATININE 1.11 05/13/2017   BUN 18 05/13/2017   CO2 29 05/13/2017   TSH 2.48 05/13/2017   INR 1.02 05/26/2015   HGBA1C 6.1 05/13/2017    Ct Maxillofacial Ltd Wo Cm  Result Date: 05/15/2017 CLINICAL DATA:  72 y/o  F; right maxillary sinus pressure. EXAM: CT PARANASAL SINUS LIMITED WITHOUT CONTRAST TECHNIQUE: Non-contiguous multidetector CT images of the paranasal sinuses were obtained in a single plane without contrast. COMPARISON:  06/05/2005 MRI of the head. FINDINGS: Frontal sinus: Normally aerated. Patent frontal sinus drainage pathways. Ethmoid sinus: Normally aerated. Maxillary sinuses:  Normally aerated. Sphenoid sinus:  Normally aerated. Nasal passages: Patent. Intact nasal septum.  Right  concha bullosa. IMPRESSION: Normally aerated paranasal sinuses. No significant paranasal sinus disease. Electronically Signed   By: Kristine Garbe M.D.   On: 05/15/2017 23:25    Assessment & Plan:   There are no diagnoses linked to this encounter. I am having Claudia [name] maintain her cholecalciferol, liraglutide, omeprazole, meloxicam, amLODipine, gabapentin, metFORMIN, DULoxetine, amoxicillin, cyanocobalamin, azithromycin, and olmesartan. We will continue to administer sodium chloride.  No orders of the defined types were placed in this encounter.    Follow-up: No Follow-up on file.  Walker Kehr, MD

## 2017-12-02 NOTE — Assessment & Plan Note (Addendum)
Sinus CT OK ENT ref - a lot of sinusitis sx's  R occipital block is an option Consider brain MRI

## 2017-12-02 NOTE — Assessment & Plan Note (Addendum)
R sided sinusitis sx's - refractory Sinus CT OK ENT ref - a lot of sinusitis sx's - Dr Lucia Gaskins

## 2017-12-05 ENCOUNTER — Telehealth: Payer: Self-pay | Admitting: Internal Medicine

## 2017-12-05 NOTE — Telephone Encounter (Signed)
Copied from [place] [id number]. Topic: Quick Communication - See Telephone Encounter >> Dec 05, 2017  2:11 PM Cleaster Corin, [place] wrote: CRM for notification. See Telephone encounter for:   12/05/17. Pt. Calling about ENT but the number is disconnected (Dr. Melony Overly [phone]) pt. Seeing if she can get referred to somewhere else. I also called the number and it didn't work. Pt. Can be reached at [phone]

## 2017-12-08 NOTE — Telephone Encounter (Signed)
Pt was able to get ahold of ENT and is taken care of

## 2017-12-09 DIAGNOSIS — J343 Hypertrophy of nasal turbinates: Secondary | ICD-10-CM | POA: Diagnosis not present

## 2017-12-09 DIAGNOSIS — J342 Deviated nasal septum: Secondary | ICD-10-CM | POA: Diagnosis not present

## 2017-12-09 DIAGNOSIS — J310 Chronic rhinitis: Secondary | ICD-10-CM | POA: Diagnosis not present

## 2017-12-15 ENCOUNTER — Telehealth: Payer: Self-pay | Admitting: Internal Medicine

## 2017-12-15 DIAGNOSIS — G44099 Other trigeminal autonomic cephalgias (TAC), not intractable: Secondary | ICD-10-CM

## 2017-12-15 NOTE — Telephone Encounter (Signed)
Copied from [place] [id number]. Topic: Quick Communication - See Telephone Encounter >> Dec 15, 2017  1:56 PM Bea Graff, [place] wrote: CRM for notification. See Telephone encounter for: Pt calling and wanted to make sure Dr. Alain Marion got the message from her ENT that he doesn't believe her problem is sinus related and would like a call with where she should go from here? Neurologist?   12/15/17.

## 2017-12-15 NOTE — Telephone Encounter (Signed)
I will refer to neurology.  Thank you

## 2017-12-15 NOTE — Telephone Encounter (Signed)
Please advise 

## 2017-12-16 NOTE — Telephone Encounter (Signed)
Pt.notified

## 2017-12-19 ENCOUNTER — Encounter: Payer: Self-pay | Admitting: Neurology

## 2017-12-22 NOTE — Telephone Encounter (Signed)
Can you please put a new referral for Neurology. Pt requested for [facility]. Phone # [phone] and fax # [phone]  Copied from [place]. Topic: General - Other >> Dec 22, 2017  1:21 PM Yvette Rack wrote: Reason for CRM: pt calling stating that she can't get into the referral for a Neurologist until April but she need to go as soon as possible she would like to go see Aram Beecham at [facility] [phone] in [place] The provider knows the family

## 2017-12-23 NOTE — Telephone Encounter (Signed)
Routed message to Dr. Alain Marion to [employer].

## 2017-12-24 NOTE — Telephone Encounter (Signed)
Ok thx.

## 2017-12-24 NOTE — Addendum Note (Signed)
Addended by: Cassandria Anger on: 12/24/2017 12:31 AM   Modules accepted: Orders

## 2017-12-25 DIAGNOSIS — M542 Cervicalgia: Secondary | ICD-10-CM | POA: Diagnosis not present

## 2017-12-25 DIAGNOSIS — M5481 Occipital neuralgia: Secondary | ICD-10-CM | POA: Diagnosis not present

## 2017-12-29 ENCOUNTER — Ambulatory Visit: Payer: Medicare HMO | Admitting: Internal Medicine

## 2018-01-05 DIAGNOSIS — I6782 Cerebral ischemia: Secondary | ICD-10-CM | POA: Diagnosis not present

## 2018-01-05 DIAGNOSIS — S0990XA Unspecified injury of head, initial encounter: Secondary | ICD-10-CM | POA: Diagnosis not present

## 2018-01-05 DIAGNOSIS — M47812 Spondylosis without myelopathy or radiculopathy, cervical region: Secondary | ICD-10-CM | POA: Diagnosis not present

## 2018-02-23 ENCOUNTER — Ambulatory Visit: Payer: Medicare HMO | Admitting: Neurology

## 2018-04-03 ENCOUNTER — Ambulatory Visit: Payer: Medicare HMO | Admitting: Internal Medicine

## 2018-04-10 ENCOUNTER — Ambulatory Visit: Payer: Medicare HMO | Admitting: Internal Medicine

## 2018-04-23 ENCOUNTER — Ambulatory Visit: Payer: Medicare HMO | Admitting: Internal Medicine

## 2018-05-08 ENCOUNTER — Ambulatory Visit: Payer: Medicare HMO | Admitting: Internal Medicine

## 2018-05-12 ENCOUNTER — Ambulatory Visit (INDEPENDENT_AMBULATORY_CARE_PROVIDER_SITE_OTHER): Payer: Medicare HMO | Admitting: Internal Medicine

## 2018-05-12 ENCOUNTER — Other Ambulatory Visit (INDEPENDENT_AMBULATORY_CARE_PROVIDER_SITE_OTHER): Payer: Medicare HMO

## 2018-05-12 ENCOUNTER — Encounter: Payer: Self-pay | Admitting: Internal Medicine

## 2018-05-12 VITALS — BP 132/72 | HR 67 | Temp 97.8°F | Ht 65.0 in | Wt 176.0 lb

## 2018-05-12 DIAGNOSIS — W548XXA Other contact with dog, initial encounter: Secondary | ICD-10-CM

## 2018-05-12 DIAGNOSIS — F329 Major depressive disorder, single episode, unspecified: Secondary | ICD-10-CM | POA: Diagnosis not present

## 2018-05-12 DIAGNOSIS — I10 Essential (primary) hypertension: Secondary | ICD-10-CM

## 2018-05-12 DIAGNOSIS — E119 Type 2 diabetes mellitus without complications: Secondary | ICD-10-CM | POA: Diagnosis not present

## 2018-05-12 DIAGNOSIS — N3281 Overactive bladder: Secondary | ICD-10-CM | POA: Diagnosis not present

## 2018-05-12 DIAGNOSIS — R6889 Other general symptoms and signs: Secondary | ICD-10-CM

## 2018-05-12 DIAGNOSIS — R739 Hyperglycemia, unspecified: Secondary | ICD-10-CM

## 2018-05-12 LAB — BASIC METABOLIC PANEL
BUN: 24 mg/dL — AB (ref 6–23)
CALCIUM: 9.6 mg/dL (ref 8.4–10.5)
CO2: 26 mEq/L (ref 19–32)
Chloride: 105 mEq/L (ref 96–112)
Creatinine, Ser: 1.24 mg/dL — ABNORMAL HIGH (ref 0.40–1.20)
GFR: 44.9 mL/min — AB (ref >60.00)
GLUCOSE: 116 mg/dL — AB (ref 70–99)
Potassium: 4.3 mEq/L (ref 3.5–5.1)
SODIUM: 141 meq/L (ref 135–145)

## 2018-05-12 MED ORDER — TOLTERODINE TARTRATE ER 4 MG PO CP24: 4.0000 mg | ORAL_CAPSULE | Freq: Every day | ORAL | 3 refills | Status: DC

## 2018-05-12 MED ORDER — DONEPEZIL HCL 5 MG PO TABS: 5.0000 mg | ORAL_TABLET | Freq: Every day | ORAL | 3 refills | Status: DC

## 2018-05-12 NOTE — Assessment & Plan Note (Signed)
Cymbalta 

## 2018-05-12 NOTE — Assessment & Plan Note (Signed)
Options discussed 

## 2018-05-12 NOTE — Assessment & Plan Note (Signed)
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

## 2018-05-12 NOTE — Assessment & Plan Note (Signed)
Losartan, Norvasc 

## 2018-05-12 NOTE — Progress Notes (Signed)
Subjective:  Patient ID: Claudia [name], female    DOB: [birth year]  Age: 72 y.o. MRN: [id number]  CC: No chief complaint on file.   HPI Gracyn B Southard presents for R forearm dog paw scratch x 1 day C/o urinary urgency, frequency F/u anxiety, memory loss  Outpatient Medications Prior to Visit  Medication Sig Dispense Refill  . amLODipine (NORVASC) 2.5 MG tablet TAKE 1 TABLET EVERY DAY 90 tablet 3  . amoxicillin (AMOXIL) 500 MG capsule Take 4 capsules (2,000 mg total) daily as needed by mouth (take 1 hr prior to dental cleaning). 20 capsule 1  . cholecalciferol (VITAMIN D) 1000 units tablet Take 1 tablet by mouth daily.    . cyanocobalamin (,VITAMIN B-12,) 1000 MCG/ML injection Inject 1 mL (1,000 mcg total) into the skin every 30 (thirty) days. 10 mL 6  . DULoxetine (CYMBALTA) 60 MG capsule Take 1 capsule (60 mg total) by mouth daily. For depression 90 capsule 3  . gabapentin (NEURONTIN) 600 MG tablet TAKE 1 TABLET THREE TIMES DAILY 270 tablet 3  . liraglutide (VICTOZA) 18 MG/3ML SOPN Inject 0.2 mLs (1.2 mg total) into the skin daily. 1 pen 11  . meloxicam (MOBIC) 15 MG tablet TAKE 1 TABLET EVERY DAY 90 tablet 3  . metFORMIN (GLUCOPHAGE) 500 MG tablet Take 1 tablet (500 mg total) by mouth daily with breakfast. 180 tablet 3  . olmesartan (BENICAR) 20 MG tablet Take 1 tablet (20 mg total) by mouth daily. 90 tablet 3  . omeprazole (PRILOSEC) 40 MG capsule TAKE 1 CAPSULE TWICE DAILY 180 capsule 3  . azithromycin (ZITHROMAX Z-PAK) 250 MG tablet As directed 6 each 0   Facility-Administered Medications Prior to Visit  Medication Dose Route Frequency Provider Last Rate Last Dose  . 0.9 %  sodium chloride infusion  500 mL Intravenous Continuous Milus Banister, MD        ROS: Review of Systems  Constitutional: Positive for fatigue. Negative for activity change, appetite change, chills and unexpected weight change.  HENT: Negative for congestion, mouth sores and sinus pressure.   Eyes:  Negative for visual disturbance.  Respiratory: Negative for cough and chest tightness.   Gastrointestinal: Negative for abdominal pain and nausea.  Genitourinary: Negative for difficulty urinating, frequency and vaginal pain.  Musculoskeletal: Positive for arthralgias and back pain. Negative for gait problem.  Skin: Negative for pallor and rash.  Neurological: Negative for dizziness, tremors, weakness, numbness and headaches.  Psychiatric/Behavioral: Negative for confusion, sleep disturbance and suicidal ideas. The patient is nervous/anxious.     Objective:  BP 132/72 (BP Location: Left Arm, Patient Position: Sitting, Cuff Size: Large)   Pulse 67   Temp 97.8 F (36.6 C) (Oral)   Ht 5\' 5"  (1.651 m)   Wt 176 lb (79.8 kg)   SpO2 97%   BMI 29.29 kg/m   BP Readings from Last 3 Encounters:  05/12/18 132/72  12/02/17 (!) 144/88  08/07/17 126/72    Wt Readings from Last 3 Encounters:  05/12/18 176 lb (79.8 kg)  12/02/17 178 lb (80.7 kg)  08/07/17 177 lb (80.3 kg)    Physical Exam  Constitutional: She appears well-developed. No distress.  HENT:  Head: Normocephalic.  Right Ear: External ear normal.  Left Ear: External ear normal.  Nose: Nose normal.  Mouth/Throat: Oropharynx is clear and moist.  Eyes: Pupils are equal, round, and reactive to light. Conjunctivae are normal. Right eye exhibits no discharge. Left eye exhibits no discharge.  Neck: Normal range of  motion. Neck supple. No JVD present. No tracheal deviation present. No thyromegaly present.  Cardiovascular: Normal rate, regular rhythm and normal heart sounds.  Pulmonary/Chest: No stridor. No respiratory distress. She has no wheezes.  Abdominal: Soft. Bowel sounds are normal. She exhibits no distension and no mass. There is no tenderness. There is no rebound and no guarding.  Musculoskeletal: She exhibits tenderness. She exhibits no edema.  Lymphadenopathy:    She has no cervical adenopathy.  Neurological: She displays  normal reflexes. No cranial nerve deficit. She exhibits normal muscle tone. Coordination normal.  Skin: No rash noted. No erythema.  Psychiatric: Her behavior is normal. Judgment and thought content normal.  hands w/OA Knees hurt w/ROM R forearm dog paw large scratch  Lab Results  Component Value Date   WBC 6.7 05/13/2017   HGB 14.9 05/13/2017   HCT 44.9 05/13/2017   PLT 283.0 05/13/2017   GLUCOSE 119 (H) 05/13/2017   CHOL 209 (H) 05/13/2017   TRIG 97.0 05/13/2017   HDL 47.50 05/13/2017   LDLCALC 142 (H) 05/13/2017   ALT 11 05/13/2017   AST 17 05/13/2017   NA 141 05/13/2017   K 4.0 05/13/2017   CL 105 05/13/2017   CREATININE 1.11 05/13/2017   BUN 18 05/13/2017   CO2 29 05/13/2017   TSH 2.48 05/13/2017   INR 1.02 05/26/2015   HGBA1C 6.1 05/13/2017    Ct Maxillofacial Ltd Wo Cm  Result Date: 05/15/2017 CLINICAL DATA:  72 y/o  F; right maxillary sinus pressure. EXAM: CT PARANASAL SINUS LIMITED WITHOUT CONTRAST TECHNIQUE: Non-contiguous multidetector CT images of the paranasal sinuses were obtained in a single plane without contrast. COMPARISON:  06/05/2005 MRI of the head. FINDINGS: Frontal sinus: Normally aerated. Patent frontal sinus drainage pathways. Ethmoid sinus: Normally aerated. Maxillary sinuses:  Normally aerated. Sphenoid sinus:  Normally aerated. Nasal passages: Patent. Intact nasal septum.  Right concha bullosa. IMPRESSION: Normally aerated paranasal sinuses. No significant paranasal sinus disease. Electronically Signed   By: Kristine Garbe M.D.   On: 05/15/2017 23:25    Assessment & Plan:   There are no diagnoses linked to this encounter.   No orders of the defined types were placed in this encounter.    Follow-up: No follow-ups on file.  Walker Kehr, MD

## 2018-05-12 NOTE — Assessment & Plan Note (Signed)
R forearm - wound dressed

## 2018-05-12 NOTE — Assessment & Plan Note (Signed)
Metformin, Victoza

## 2018-05-12 NOTE — Assessment & Plan Note (Signed)
A1c

## 2018-05-14 ENCOUNTER — Other Ambulatory Visit: Payer: Self-pay | Admitting: Internal Medicine

## 2018-05-18 ENCOUNTER — Telehealth: Payer: Self-pay | Admitting: *Deleted

## 2018-05-18 NOTE — Telephone Encounter (Signed)
No.  The patient is having memory issues and is unable to take oxybutynin. Thank you

## 2018-05-18 NOTE — Telephone Encounter (Signed)
Received request from [facility] to change Detrol LA 4 mg cap to Oxybutynin 5 mg due to cost savings of $1316/year. Ok to change?

## 2018-05-19 NOTE — Telephone Encounter (Signed)
[employer] informed no change. We can do tier exception to help with cost. Need to call [phone].

## 2018-05-22 NOTE — Telephone Encounter (Signed)
I initiated the tier exception with [employer]. We will receive coverage determination via fax.   Ref # [id number]

## 2018-05-28 ENCOUNTER — Telehealth: Payer: Self-pay | Admitting: Internal Medicine

## 2018-05-28 NOTE — Telephone Encounter (Signed)
Please advise 

## 2018-05-28 NOTE — Telephone Encounter (Signed)
Copied from [place] [id number]. Topic: Quick Communication - See Telephone Encounter >> May 28, 2018 10:16 AM Conception Chancy, [place] wrote: CRM for notification. See Telephone encounter for: 05/28/18.  Patient is calling and states her kidney function levels were off a little and was told to quit taking meloxicam (MOBIC) 15 MG tablet. She states she stopped taking that but she has been on it for over 20 years and she is hurting all over and is unable to function. Please contact patient.

## 2018-05-28 NOTE — Telephone Encounter (Signed)
There is not too many options we have: low dose prednisone, gabapentin. Pls see mre to discuss Thx

## 2018-05-29 NOTE — Telephone Encounter (Signed)
Pt need appt, please call pt and schedule. Thank you!

## 2018-05-29 NOTE — Telephone Encounter (Signed)
Appointment scheduled.

## 2018-06-02 ENCOUNTER — Encounter: Payer: Self-pay | Admitting: Internal Medicine

## 2018-06-02 ENCOUNTER — Ambulatory Visit (INDEPENDENT_AMBULATORY_CARE_PROVIDER_SITE_OTHER): Payer: Medicare HMO | Admitting: Internal Medicine

## 2018-06-02 VITALS — BP 112/68 | HR 74 | Temp 98.2°F | Ht 65.0 in | Wt 176.0 lb

## 2018-06-02 DIAGNOSIS — M797 Fibromyalgia: Secondary | ICD-10-CM | POA: Diagnosis not present

## 2018-06-02 DIAGNOSIS — M1990 Unspecified osteoarthritis, unspecified site: Secondary | ICD-10-CM | POA: Diagnosis not present

## 2018-06-02 DIAGNOSIS — D510 Vitamin B12 deficiency anemia due to intrinsic factor deficiency: Secondary | ICD-10-CM

## 2018-06-02 MED ORDER — "SYRINGE/NEEDLE (DISP) 25G X 1"" 3 ML MISC": 3 refills | Status: DC

## 2018-06-02 MED ORDER — CYANOCOBALAMIN 1000 MCG/ML IJ SOLN
1000.0000 ug | Freq: Once | INTRAMUSCULAR | Status: AC
  Administered 2018-06-02: 1000 ug via SUBCUTANEOUS

## 2018-06-02 MED ORDER — CYANOCOBALAMIN 1000 MCG/ML IJ SOLN: 1000.0000 ug | INTRAMUSCULAR | 6 refills | Status: DC

## 2018-06-02 MED ORDER — PREDNISONE 5 MG PO TABS: 5.0000 mg | ORAL_TABLET | Freq: Every day | ORAL | 2 refills | Status: DC

## 2018-06-02 NOTE — Progress Notes (Signed)
Subjective:  Patient ID: Claudia [name], female    DOB: 02-27-1946  Age: 72 y.o. MRN: [id number]  CC: No chief complaint on file.   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 amLODipine (NORVASC) 2.5 MG tablet TAKE 1 TABLET EVERY DAY 90 tablet 3  . amoxicillin (AMOXIL) 500 MG capsule Take 4 capsules (2,000 mg total) daily as needed by mouth (take 1 hr prior to dental cleaning). 20 capsule 1  . cholecalciferol (VITAMIN D) 1000 units tablet Take 1 tablet by mouth daily.    . cyanocobalamin (,VITAMIN B-12,) 1000 MCG/ML injection Inject 1 mL (1,000 mcg total) into the skin every 30 (thirty) days. 10 mL 6  . donepezil (ARICEPT) 5 MG tablet Take 1 tablet (5 mg total) by mouth at bedtime. 90 tablet 3  . DULoxetine (CYMBALTA) 60 MG capsule Take 1 capsule (60 mg total) by mouth daily. For depression 90 capsule 3  . gabapentin (NEURONTIN) 600 MG tablet TAKE 1 TABLET THREE TIMES DAILY 270 tablet 3  . metFORMIN (GLUCOPHAGE) 500 MG tablet Take 1 tablet (500 mg total) by mouth daily with breakfast. 180 tablet 3  . olmesartan (BENICAR) 20 MG tablet Take 1 tablet (20 mg total) by mouth daily. 90 tablet 3  . omeprazole (PRILOSEC) 40 MG capsule TAKE 1 CAPSULE TWICE DAILY 180 capsule 3  . liraglutide (VICTOZA) 18 MG/3ML SOPN Inject 0.2 mLs (1.2 mg total) into the skin daily. 1 pen 11  . tolterodine (DETROL LA) 4 MG 24 hr capsule Take 1 capsule (4 mg total) by mouth daily. (Patient not taking: Reported on 06/02/2018) 90 capsule 3   Facility-Administered Medications Prior to Visit  Medication Dose Route Frequency Provider Last Rate Last Dose  . 0.9 %  sodium chloride infusion  500 mL Intravenous Continuous Milus Banister, MD        ROS: Review of Systems  Constitutional: Negative for activity change, appetite change, chills, fatigue and unexpected weight  change.  HENT: Negative for congestion, mouth sores and sinus pressure.   Eyes: Negative for visual disturbance.  Respiratory: Negative for cough and chest tightness.   Gastrointestinal: Negative for abdominal pain and nausea.  Genitourinary: Negative for difficulty urinating, frequency and vaginal pain.  Musculoskeletal: Positive for arthralgias, back pain, gait problem, joint swelling, neck pain and neck stiffness.  Skin: Negative for pallor and rash.  Neurological: Negative for dizziness, tremors, weakness, numbness and headaches.  Psychiatric/Behavioral: Negative for confusion, sleep disturbance and suicidal ideas. The patient is nervous/anxious.     Objective:  BP 112/68 (BP Location: Left Arm, Patient Position: Sitting, Cuff Size: Large)   Pulse 74   Temp 98.2 F (36.8 C) (Oral)   Ht 5\' 5"  (1.651 m)   Wt 176 lb (79.8 kg)   SpO2 98%   BMI 29.29 kg/m   BP Readings from Last 3 Encounters:  06/02/18 112/68  05/12/18 132/72  12/02/17 (!) 144/88    Wt Readings from Last 3 Encounters:  06/02/18 176 lb (79.8 kg)  05/12/18 176 lb (79.8 kg)  12/02/17 178 lb (80.7 kg)    Physical Exam  Constitutional: She appears well-developed. No distress.  HENT:  Head: Normocephalic.  Right Ear: External ear normal.  Left Ear: External ear normal.  Nose: Nose normal.  Mouth/Throat: Oropharynx is clear and moist.  Eyes: Pupils are equal, round,  and reactive to light. Conjunctivae are normal. Right eye exhibits no discharge. Left eye exhibits no discharge.  Neck: Normal range of motion. Neck supple. No JVD present. No tracheal deviation present. No thyromegaly present.  Cardiovascular: Normal rate, regular rhythm and normal heart sounds.  Pulmonary/Chest: No stridor. No respiratory distress. She has no wheezes.  Abdominal: Soft. Bowel sounds are normal. She exhibits no distension and no mass. There is no tenderness. There is no rebound and no guarding.  Musculoskeletal: She exhibits  tenderness. She exhibits no edema.  Lymphadenopathy:    She has no cervical adenopathy.  Neurological: She displays normal reflexes. No cranial nerve deficit. She exhibits normal muscle tone. Coordination abnormal.  Skin: No rash noted. No erythema.  Psychiatric: She has a normal mood and affect. Her behavior is normal. Judgment and thought content normal.   painful LS, neck, joint deformities Gait and getting up on the exam table is very impaired due to pain  Lab Results  Component Value Date   WBC 6.7 05/13/2017   HGB 14.9 05/13/2017   HCT 44.9 05/13/2017   PLT 283.0 05/13/2017   GLUCOSE 116 (H) 05/12/2018   CHOL 209 (H) 05/13/2017   TRIG 97.0 05/13/2017   HDL 47.50 05/13/2017   LDLCALC 142 (H) 05/13/2017   ALT 11 05/13/2017   AST 17 05/13/2017   NA 141 05/12/2018   K 4.3 05/12/2018   CL 105 05/12/2018   CREATININE 1.24 (H) 05/12/2018   BUN 24 (H) 05/12/2018   CO2 26 05/12/2018   TSH 2.48 05/13/2017   INR 1.02 05/26/2015   HGBA1C 6.1 05/13/2017    Ct Maxillofacial Ltd Wo Cm  Result Date: 05/15/2017 CLINICAL DATA:  72 y/o  F; right maxillary sinus pressure. EXAM: CT PARANASAL SINUS LIMITED WITHOUT CONTRAST TECHNIQUE: Non-contiguous multidetector CT images of the paranasal sinuses were obtained in a single plane without contrast. COMPARISON:  06/05/2005 MRI of the head. FINDINGS: Frontal sinus: Normally aerated. Patent frontal sinus drainage pathways. Ethmoid sinus: Normally aerated. Maxillary sinuses:  Normally aerated. Sphenoid sinus:  Normally aerated. Nasal passages: Patent. Intact nasal septum.  Right concha bullosa. IMPRESSION: Normally aerated paranasal sinuses. No significant paranasal sinus disease. Electronically Signed   By: Kristine Garbe M.D.   On: 05/15/2017 23:25    Assessment & Plan:   There are no diagnoses linked to this encounter.   No orders of the defined types were placed in this encounter.    Follow-up: No follow-ups on file.  Walker Kehr, MD

## 2018-06-02 NOTE — Assessment & Plan Note (Signed)
Gabapentin po Low dose steroids discussed - pt wants to try it  Potential benefits of a long term steroid  use as well as potential risks  and complications were explained to the patient and were aknowledged.

## 2018-06-02 NOTE — Assessment & Plan Note (Signed)
B12 inj 

## 2018-06-03 ENCOUNTER — Other Ambulatory Visit: Payer: Self-pay | Admitting: Internal Medicine

## 2018-07-01 ENCOUNTER — Ambulatory Visit (INDEPENDENT_AMBULATORY_CARE_PROVIDER_SITE_OTHER): Payer: Medicare HMO | Admitting: Internal Medicine

## 2018-07-01 ENCOUNTER — Encounter: Payer: Self-pay | Admitting: Internal Medicine

## 2018-07-01 ENCOUNTER — Other Ambulatory Visit (INDEPENDENT_AMBULATORY_CARE_PROVIDER_SITE_OTHER): Payer: Medicare HMO

## 2018-07-01 VITALS — BP 120/72 | HR 64 | Temp 97.9°F | Ht 65.0 in | Wt 177.0 lb

## 2018-07-01 DIAGNOSIS — N183 Chronic kidney disease, stage 3 unspecified: Secondary | ICD-10-CM

## 2018-07-01 DIAGNOSIS — E1122 Type 2 diabetes mellitus with diabetic chronic kidney disease: Secondary | ICD-10-CM | POA: Diagnosis not present

## 2018-07-01 DIAGNOSIS — M797 Fibromyalgia: Secondary | ICD-10-CM

## 2018-07-01 DIAGNOSIS — I10 Essential (primary) hypertension: Secondary | ICD-10-CM | POA: Diagnosis not present

## 2018-07-01 DIAGNOSIS — N2889 Other specified disorders of kidney and ureter: Secondary | ICD-10-CM

## 2018-07-01 DIAGNOSIS — M2550 Pain in unspecified joint: Secondary | ICD-10-CM | POA: Diagnosis not present

## 2018-07-01 DIAGNOSIS — E119 Type 2 diabetes mellitus without complications: Secondary | ICD-10-CM | POA: Diagnosis not present

## 2018-07-01 DIAGNOSIS — F411 Generalized anxiety disorder: Secondary | ICD-10-CM

## 2018-07-01 DIAGNOSIS — D510 Vitamin B12 deficiency anemia due to intrinsic factor deficiency: Secondary | ICD-10-CM

## 2018-07-01 LAB — BASIC METABOLIC PANEL
BUN: 19 mg/dL (ref 6–23)
CALCIUM: 9.9 mg/dL (ref 8.4–10.5)
CHLORIDE: 107 meq/L (ref 96–112)
CO2: 28 meq/L (ref 19–32)
CREATININE: 1.18 mg/dL (ref 0.40–1.20)
GFR: 47.53 mL/min — ABNORMAL LOW (ref >60.00)
GLUCOSE: 140 mg/dL — AB (ref 70–99)
Potassium: 4.6 mEq/L (ref 3.5–5.1)
SODIUM: 143 meq/L (ref 135–145)

## 2018-07-01 LAB — SEDIMENTATION RATE: Sed Rate: 33 mm/hr — ABNORMAL HIGH (ref 0–30)

## 2018-07-01 NOTE — Assessment & Plan Note (Signed)
Metformin 

## 2018-07-01 NOTE — Assessment & Plan Note (Signed)
Losartan, Norvasc 

## 2018-07-01 NOTE — Assessment & Plan Note (Signed)
On B12 

## 2018-07-01 NOTE — Progress Notes (Signed)
Subjective:  Patient ID: Claudia [name], female    DOB: 11-09-1946  Age: 72 y.o. MRN: [id number]  CC: No chief complaint on file.   HPI Claudia [name] presents for FMS and OA CBD oil did not help She did not use Prednisone - her dtr said "no" F/u CRI off Meloxicam  Outpatient Medications Prior to Visit  Medication Sig Dispense Refill  . amLODipine (NORVASC) 2.5 MG tablet TAKE 1 TABLET EVERY DAY 90 tablet 3  . amoxicillin (AMOXIL) 500 MG capsule Take 4 capsules (2,000 mg total) daily as needed by mouth (take 1 hr prior to dental cleaning). 20 capsule 1  . cholecalciferol (VITAMIN D) 1000 units tablet Take 1 tablet by mouth daily.    . cyanocobalamin (,VITAMIN B-12,) 1000 MCG/ML injection Inject 1 mL (1,000 mcg total) into the skin every 14 (fourteen) days. 10 mL 6  . donepezil (ARICEPT) 5 MG tablet Take 1 tablet (5 mg total) by mouth at bedtime. 90 tablet 3  . DULoxetine (CYMBALTA) 60 MG capsule Take 1 capsule (60 mg total) by mouth daily. For depression 90 capsule 3  . gabapentin (NEURONTIN) 600 MG tablet TAKE 1 TABLET THREE TIMES DAILY 270 tablet 3  . metFORMIN (GLUCOPHAGE) 500 MG tablet Take 1 tablet (500 mg total) by mouth daily with breakfast. 180 tablet 3  . olmesartan (BENICAR) 20 MG tablet Take 1 tablet (20 mg total) by mouth daily. 90 tablet 3  . omeprazole (PRILOSEC) 40 MG capsule TAKE 1 CAPSULE TWICE DAILY 180 capsule 3  . SYRINGE-NEEDLE, DISP, 3 ML (BD ECLIPSE SYRINGE) 25G X 1" 3 ML MISC As directed SQ for B12 shots 50 each 3  . predniSONE (DELTASONE) 5 MG tablet Take 1 tablet (5 mg total) by mouth daily with breakfast. (Patient not taking: Reported on 07/01/2018) 30 tablet 2   Facility-Administered Medications Prior to Visit  Medication Dose Route Frequency Provider Last Rate Last Dose  . 0.9 %  sodium chloride infusion  500 mL Intravenous Continuous Milus Banister, MD        ROS: Review of Systems  Constitutional: Positive for fatigue. Negative for activity change,  appetite change, chills and unexpected weight change.  HENT: Negative for congestion, mouth sores and sinus pressure.   Eyes: Negative for visual disturbance.  Respiratory: Negative for cough and chest tightness.   Gastrointestinal: Negative for abdominal pain and nausea.  Genitourinary: Negative for difficulty urinating, frequency and vaginal pain.  Musculoskeletal: Positive for arthralgias, back pain, gait problem, myalgias, neck pain and neck stiffness.  Skin: Negative for pallor and rash.  Neurological: Negative for dizziness, tremors, weakness, numbness and headaches.  Psychiatric/Behavioral: Positive for decreased concentration. Negative for confusion, sleep disturbance and suicidal ideas. The patient is nervous/anxious.     Objective:  BP 120/72 (BP Location: Left Arm, Patient Position: Sitting, Cuff Size: Normal)   Pulse 64   Temp 97.9 F (36.6 C) (Oral)   Ht 5\' 5"  (1.651 m)   Wt 177 lb (80.3 kg)   SpO2 98%   BMI 29.45 kg/m   BP Readings from Last 3 Encounters:  07/01/18 120/72  06/02/18 112/68  05/12/18 132/72    Wt Readings from Last 3 Encounters:  07/01/18 177 lb (80.3 kg)  06/02/18 176 lb (79.8 kg)  05/12/18 176 lb (79.8 kg)    Physical Exam  Constitutional: She appears well-developed. No distress.  HENT:  Head: Normocephalic.  Right Ear: External ear normal.  Left Ear: External ear normal.  Nose: Nose normal.  Mouth/Throat: Oropharynx is clear and moist.  Eyes: Pupils are equal, round, and reactive to light. Conjunctivae are normal. Right eye exhibits no discharge. Left eye exhibits no discharge.  Neck: Normal range of motion. Neck supple. No JVD present. No tracheal deviation present. No thyromegaly present.  Cardiovascular: Normal rate, regular rhythm and normal heart sounds.  Pulmonary/Chest: No stridor. No respiratory distress. She has no wheezes.  Abdominal: Soft. Bowel sounds are normal. She exhibits no distension and no mass. There is no tenderness.  There is no rebound and no guarding.  Musculoskeletal: She exhibits tenderness. She exhibits no edema.  Lymphadenopathy:    She has no cervical adenopathy.  Neurological: She displays normal reflexes. No cranial nerve deficit. She exhibits normal muscle tone. Coordination normal.  Skin: No rash noted. No erythema.  Psychiatric: Her behavior is normal. Judgment and thought content normal.    Lab Results  Component Value Date   WBC 6.7 05/13/2017   HGB 14.9 05/13/2017   HCT 44.9 05/13/2017   PLT 283.0 05/13/2017   GLUCOSE 116 (H) 05/12/2018   CHOL 209 (H) 05/13/2017   TRIG 97.0 05/13/2017   HDL 47.50 05/13/2017   LDLCALC 142 (H) 05/13/2017   ALT 11 05/13/2017   AST 17 05/13/2017   NA 141 05/12/2018   K 4.3 05/12/2018   CL 105 05/12/2018   CREATININE 1.24 (H) 05/12/2018   BUN 24 (H) 05/12/2018   CO2 26 05/12/2018   TSH 2.48 05/13/2017   INR 1.02 05/26/2015   HGBA1C 6.1 05/13/2017    Ct Maxillofacial Ltd Wo Cm  Result Date: 05/15/2017 CLINICAL DATA:  72 y/o  F; right maxillary sinus pressure. EXAM: CT PARANASAL SINUS LIMITED WITHOUT CONTRAST TECHNIQUE: Non-contiguous multidetector CT images of the paranasal sinuses were obtained in a single plane without contrast. COMPARISON:  06/05/2005 MRI of the head. FINDINGS: Frontal sinus: Normally aerated. Patent frontal sinus drainage pathways. Ethmoid sinus: Normally aerated. Maxillary sinuses:  Normally aerated. Sphenoid sinus:  Normally aerated. Nasal passages: Patent. Intact nasal septum.  Right concha bullosa. IMPRESSION: Normally aerated paranasal sinuses. No significant paranasal sinus disease. Electronically Signed   By: Kristine Garbe M.D.   On: 05/15/2017 23:25    Assessment & Plan:   There are no diagnoses linked to this encounter.   No orders of the defined types were placed in this encounter.    Follow-up: No follow-ups on file.  Walker Kehr, MD

## 2018-07-01 NOTE — Assessment & Plan Note (Signed)
Off Meloxicam now Re-check BMET

## 2018-07-01 NOTE — Assessment & Plan Note (Signed)
Duloxetine

## 2018-07-01 NOTE — Assessment & Plan Note (Addendum)
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 Pt declined a low dose steroid trial

## 2018-07-02 LAB — RHEUMATOID FACTOR

## 2018-07-27 DIAGNOSIS — Z961 Presence of intraocular lens: Secondary | ICD-10-CM | POA: Diagnosis not present

## 2018-07-27 DIAGNOSIS — H353131 Nonexudative age-related macular degeneration, bilateral, early dry stage: Secondary | ICD-10-CM | POA: Diagnosis not present

## 2018-07-27 DIAGNOSIS — H524 Presbyopia: Secondary | ICD-10-CM | POA: Diagnosis not present

## 2018-07-27 DIAGNOSIS — E119 Type 2 diabetes mellitus without complications: Secondary | ICD-10-CM | POA: Diagnosis not present

## 2018-07-27 LAB — HM DIABETES EYE EXAM

## 2018-08-07 ENCOUNTER — Encounter: Payer: Self-pay | Admitting: Internal Medicine

## 2018-08-07 NOTE — Progress Notes (Signed)
Abstracted and sent to scan  

## 2018-08-12 ENCOUNTER — Ambulatory Visit: Payer: Medicare HMO | Admitting: Internal Medicine

## 2018-08-27 ENCOUNTER — Other Ambulatory Visit: Payer: Self-pay | Admitting: Internal Medicine

## 2018-08-28 ENCOUNTER — Encounter: Payer: Self-pay | Admitting: Family

## 2018-08-28 ENCOUNTER — Ambulatory Visit (INDEPENDENT_AMBULATORY_CARE_PROVIDER_SITE_OTHER): Payer: Medicare HMO | Admitting: Family

## 2018-08-28 ENCOUNTER — Ambulatory Visit (INDEPENDENT_AMBULATORY_CARE_PROVIDER_SITE_OTHER)
Admission: RE | Admit: 2018-08-28 | Discharge: 2018-08-28 | Disposition: A | Discharge status: Home / Self Care | Payer: Medicare HMO | Source: Ambulatory Visit | Attending: Family | Admitting: Family

## 2018-08-28 VITALS — BP 134/78 | HR 74 | Temp 97.6°F | Ht 65.0 in | Wt 180.1 lb

## 2018-08-28 DIAGNOSIS — M25551 Pain in right hip: Secondary | ICD-10-CM

## 2018-08-28 DIAGNOSIS — S79911A Unspecified injury of right hip, initial encounter: Secondary | ICD-10-CM | POA: Diagnosis not present

## 2018-08-28 MED ORDER — NAPROXEN 500 MG PO TABS: 500.0000 mg | ORAL_TABLET | Freq: Two times a day (BID) | ORAL | 0 refills | Status: DC

## 2018-08-28 MED ORDER — METHOCARBAMOL 500 MG PO TABS: 500.0000 mg | ORAL_TABLET | Freq: Three times a day (TID) | ORAL | 0 refills | Status: DC | PRN

## 2018-08-28 NOTE — Progress Notes (Signed)
Claudia [name] is a 72 y.o. female with the following history as recorded in Epic[employer]:  Patient Active Problem List   Diagnosis Date Noted  . CRI (chronic renal insufficiency), stage 3 (moderate) ([place]) 07/01/2018  . Dog scratch 05/12/2018  . Sinusitis, chronic 12/02/2017  . Paresthesia 11/06/2017  . Skin rash 08/07/2017  . Headache 05/13/2017  . OAB (overactive bladder) 05/14/2016  . Concussion with loss of consciousness 01/05/2016  . Neoplasm of uncertain behavior of skin 01/05/2016  . Acute upper respiratory infection 09/12/2015  . Diabetes type 2, controlled ([place]) 09/12/2015  . 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 Diastolic dysfunction without heart failure 05/14/2015  . Preop exam for internal medicine 05/10/2015  . Hamstring tendonitis of right thigh 01/27/2015  . Forgetfulness 11/02/2014  . Pes planus of both feet 08/08/2014  . Avulsion fracture of lateral malleolus 06/07/2014  . Sprain of ankle, unspecified site 02/01/2014  . Tibialis posterior tendinitis 12/03/2013  . Ankle pain, right 09/14/2013  . Hyperglycemia 09/14/2013  . Leg pain 04/29/2012  . Knee pain 04/29/2012  . Well adult exam 10/23/2011  . 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 Weight gain 04/10/2011  . LIPOMA 10/17/2010  . HEMATURIA UNSPECIFIED 08/27/2010  . Abdominal pain 07/16/2010  . WEIGHT LOSS 05/09/2010  . FREQUENCY, URINARY 05/09/2010  . ABDOMINAL PAIN, EPIGASTRIC 05/09/2010  . VERTIGO 02/06/2010  . NECK PAIN 03/28/2009  . GALLSTONES 12/07/2008  . HEMORRHOIDS, INTERNAL 11/23/2008  . ESOPHAGEAL STRICTURE 11/23/2008  . HIATAL HERNIA 11/23/2008  . IBS 11/23/2008  . Upper respiratory infection 11/01/2008  . Chest pain, unspecified 11/01/2008  . Pernicious anemia 08/16/2008  . Chronic fatigue 08/16/2008  . Anxiety state 11/25/2007  . Depression 11/25/2007  . Osteoarthritis 11/25/2007  . CRAMPS,LEG 11/25/2007  . BREAST CANCER, HX OF 11/25/2007  . RESTLESS LEG  SYNDROME 07/23/2007  . Essential hypertension 07/23/2007  . Fibromyalgia 07/23/2007  . NEURALGIA 07/23/2007  . INSOMNIA 07/23/2007    Current Outpatient Medications  Medication Sig Dispense Refill  . amLODipine (NORVASC) 2.5 MG tablet TAKE 1 TABLET EVERY DAY 90 tablet 3  . cholecalciferol (VITAMIN D) 1000 units tablet Take 1 tablet by mouth daily.    Marland Kitchen donepezil (ARICEPT) 5 MG tablet Take 1 tablet (5 mg total) by mouth at bedtime. 90 tablet 3  . DULoxetine (CYMBALTA) 60 MG capsule TAKE 1 CAPSULE EVERY DAY FOR DEPRESSION 90 capsule 3  . gabapentin (NEURONTIN) 600 MG tablet TAKE 1 TABLET THREE TIMES DAILY 270 tablet 3  . metFORMIN (GLUCOPHAGE) 500 MG tablet Take 1 tablet (500 mg total) by mouth daily with breakfast. 180 tablet 3  . olmesartan (BENICAR) 20 MG tablet Take 1 tablet (20 mg total) by mouth daily. 90 tablet 3  . omeprazole (PRILOSEC) 40 MG capsule TAKE 1 CAPSULE TWICE DAILY 180 capsule 3  . SYRINGE-NEEDLE, DISP, 3 ML (BD ECLIPSE SYRINGE) 25G X 1" 3 ML MISC As directed SQ for B12 shots 50 each 3  . amoxicillin (AMOXIL) 500 MG capsule Take 4 capsules (2,000 mg total) daily as needed by mouth (take 1 hr prior to dental cleaning). (Patient not taking: Reported on 08/28/2018) 20 capsule 1  . cyanocobalamin (,VITAMIN B-12,) 1000 MCG/ML injection Inject 1 mL (1,000 mcg total) into the skin every 14 (fourteen) days. (Patient not taking: Reported on 08/28/2018) 10 mL 6  . methocarbamol (ROBAXIN) 500 MG tablet Take 1 tablet (500 mg total) by mouth every 8 (eight) hours as needed. 30 tablet 0  .  naproxen (NAPROSYN) 500 MG tablet Take 1 tablet (500 mg total) by mouth 2 (two) times daily with a meal. 30 tablet 0   Current Facility-Administered Medications  Medication Dose Route Frequency Provider Last Rate Last Dose  . 0.9 %  sodium chloride infusion  500 mL Intravenous Continuous Milus Banister, MD        Allergies: Bupropion hcl; Lovastatin; Norco [hydrocodone-acetaminophen];  Olanzapine-fluoxetine hcl; Sodium lauryl sulfate; Sulfonamide derivatives; Tramadol; and Tape  Past Medical History:  Diagnosis Date  . Acute upper respiratory infections of unspecified site   . Anxiety state, unspecified   . Breast cancer ([place])   . Chest pain, unspecified   . Cramp of limb   . Depressive disorder, not elsewhere classified   . Diverticulitis    pt stated  . DM (diabetes mellitus) ([place])   . Headache(784.0)   . Hiatal hernia   . History of cold sores   . IBS (irritable bowel syndrome)   . Insomnia, unspecified   . Internal hemorrhoids without mention of complication   . Irritable bowel syndrome   . Myalgia and myositis, unspecified   . Neuralgia of chest    post op  . Neuralgia, neuritis, and radiculitis, unspecified   . Osteoarthritis   . Osteoarthrosis, unspecified whether generalized or localized, unspecified site   . Other B-complex deficiencies   . Other malaise and fatigue   . Personal history of malignant neoplasm of breast   . Pneumonia    hx of  . Restless legs syndrome (RLS)   . Seizures ([place])    "mini seizures" 15 yrs. ago. Pt. states did have work up and was normal  . Stricture and stenosis of esophagus   . Unspecified essential hypertension     Past Surgical History:  Procedure Laterality Date  . ABDOMINAL HYSTERECTOMY    . breast reconstruction surgery bilateral - 8 yrs. ago    . CHOLECYSTECTOMY    . deviated septum surgery x 3    . hx. of bell's palsy at 14 yrs. old    . MASTECTOMY     bilateral total  . 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 rotator cuff surgery -left shoulder- 8 yrs. ago    . salva gland removed from left side of neck 60 yrs. ago    . TONSILLECTOMY    . TOTAL KNEE ARTHROPLASTY Left 06/01/2015   Procedure: LEFT TOTAL KNEE ARTHROPLASTY;  Surgeon: Rod Can, MD;  Location: [facility];  Service: Orthopedics;  Laterality: Left;    Family History  Problem Relation Age of Onset  . Diabetes Mother   .  Kidney disease Mother   . Lymphoma Mother   . Diabetes Sister   . Heart disease Sister   . Stroke Sister   . Diabetes Brother   . Kidney disease Brother   . Coronary artery disease Brother   . Breast cancer Paternal Grandmother   . Coronary artery disease Maternal Grandmother     Social History   Tobacco Use  . Smoking status: Former Smoker    Last attempt to quit: 11/26/1971    Years since quitting: 46.7  . Smokeless tobacco: Never Used  Substance Use Topics  . 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 Was helping to participate in [employer] lunch; Tripped over set of steps at stage- fell down 3 set of steps; did not hit her head in the fall; "jerked her  neck in the fall."  Has noticed bruise over right hip- hematoma "immediately popped up." Would like to get an X-ray of her right hip today;        Objective:  Vitals:   08/28/18 1521  BP: 134/78  Pulse: 74  Temp: 97.6 F (36.4 C)  TempSrc: Oral  SpO2: 97%  Weight: 180 lb 1.3 oz (81.7 kg)  Height: 5\' 5"  (1.651 m)    General: Well developed, well nourished, in no acute distress  Skin : Warm and dry.  Head: Normocephalic and atraumatic  Eyes: Sclera and conjunctiva clear; pupils round and reactive to light; extraocular movements intact  Ears: External normal; canals clear; tympanic membranes normal  Oropharynx: Pink, supple. No suspicious lesions  Neck: Supple without thyromegaly, adenopathy  Lungs: Respirations unlabored; clear to auscultation bilaterally without wheeze, rales, rhonchi  CVS exam: normal rate and regular rhythm.  Musculoskeletal: No deformities; no active joint inflammation; bruising noted over right hip Extremities: No edema, cyanosis, clubbing  Vessels: Symmetric bilaterally  Neurologic: Alert and oriented; speech intact; face symmetrical; moves all extremities well; CNII-XII intact without focal deficit  Assessment:  1. Right hip pain      Plan:  Low suspicion for fracture but will update X-ray today; Rx for Naproxen and Robaxin; follow-up worse, no better.  Encouraged the patient to schedule a follow-up with her PCP as she has other chronic issues she wanted to discuss with him but did not want to address today.    No follow-ups on file.  Orders Placed This Encounter  Procedures  . DG HIP UNILAT WITH PELVIS 2-3 VIEWS RIGHT    Standing Status:   Future    Number of Occurrences:   1    Standing Expiration Date:   10/29/2019    Order Specific Question:   Reason for Exam (SYMPTOM  OR DIAGNOSIS REQUIRED)    Answer:   pain s/p fall    Order Specific Question:   Preferred imaging location?    Answer:   [facility]    Order Specific Question:   Radiology Contrast Protocol - do NOT remove file path    Answer:   [email or web address]    Requested Prescriptions   Signed Prescriptions Disp Refills  . methocarbamol (ROBAXIN) 500 MG tablet 30 tablet 0    Sig: Take 1 tablet (500 mg total) by mouth every 8 (eight) hours as needed.  . naproxen (NAPROSYN) 500 MG tablet 30 tablet 0    Sig: Take 1 tablet (500 mg total) by mouth 2 (two) times daily with a meal.

## 2018-10-07 ENCOUNTER — Ambulatory Visit (INDEPENDENT_AMBULATORY_CARE_PROVIDER_SITE_OTHER): Payer: Medicare HMO | Admitting: Internal Medicine

## 2018-10-07 ENCOUNTER — Encounter

## 2018-10-07 ENCOUNTER — Other Ambulatory Visit (INDEPENDENT_AMBULATORY_CARE_PROVIDER_SITE_OTHER): Payer: Medicare HMO

## 2018-10-07 ENCOUNTER — Encounter: Payer: Self-pay | Admitting: Internal Medicine

## 2018-10-07 VITALS — BP 136/78 | HR 63 | Temp 97.9°F | Ht 65.0 in | Wt 178.0 lb

## 2018-10-07 DIAGNOSIS — N183 Chronic kidney disease, stage 3 unspecified: Secondary | ICD-10-CM

## 2018-10-07 DIAGNOSIS — N2889 Other specified disorders of kidney and ureter: Secondary | ICD-10-CM

## 2018-10-07 DIAGNOSIS — E119 Type 2 diabetes mellitus without complications: Secondary | ICD-10-CM | POA: Diagnosis not present

## 2018-10-07 DIAGNOSIS — G8929 Other chronic pain: Secondary | ICD-10-CM | POA: Diagnosis not present

## 2018-10-07 DIAGNOSIS — D510 Vitamin B12 deficiency anemia due to intrinsic factor deficiency: Secondary | ICD-10-CM | POA: Diagnosis not present

## 2018-10-07 DIAGNOSIS — S7001XD Contusion of right hip, subsequent encounter: Secondary | ICD-10-CM

## 2018-10-07 DIAGNOSIS — S7000XA Contusion of unspecified hip, initial encounter: Secondary | ICD-10-CM | POA: Insufficient documentation

## 2018-10-07 DIAGNOSIS — M25511 Pain in right shoulder: Secondary | ICD-10-CM | POA: Diagnosis not present

## 2018-10-07 DIAGNOSIS — I10 Essential (primary) hypertension: Secondary | ICD-10-CM

## 2018-10-07 LAB — BASIC METABOLIC PANEL
BUN: 26 mg/dL — AB (ref 6–23)
CHLORIDE: 107 meq/L (ref 96–112)
CO2: 25 mEq/L (ref 19–32)
CREATININE: 1.28 mg/dL — AB (ref 0.40–1.20)
Calcium: 9.3 mg/dL (ref 8.4–10.5)
GFR: 43.24 mL/min — ABNORMAL LOW (ref >60.00)
GLUCOSE: 131 mg/dL — AB (ref 70–99)
POTASSIUM: 4.1 meq/L (ref 3.5–5.1)
Sodium: 141 mEq/L (ref 135–145)

## 2018-10-07 LAB — HEMOGLOBIN A1C: Hgb A1c MFr Bld: 6 % (ref 4.6–6.5)

## 2018-10-07 NOTE — Assessment & Plan Note (Signed)
R hip 10/19 s/p a fall [employer] form

## 2018-10-07 NOTE — Assessment & Plan Note (Signed)
R rot cuff sprain PT offered

## 2018-10-07 NOTE — Assessment & Plan Note (Addendum)
Losartan, Norvasc CT Ca scoring info

## 2018-10-07 NOTE — Assessment & Plan Note (Signed)
B-12 shot

## 2018-10-07 NOTE — Progress Notes (Signed)
Subjective:  Patient ID: Claudia [name], female    DOB: 09-28-1946  Age: 72 y.o. MRN: [id number]  CC: No chief complaint on file.   HPI Claudia [name] presents for B12 def, HTN, OA S/p a recent fall - R hip hematoma The pt has not seen a rheumatologist yet  Outpatient Medications Prior to Visit  Medication Sig Dispense Refill  . amLODipine (NORVASC) 2.5 MG tablet TAKE 1 TABLET EVERY DAY 90 tablet 3  . amoxicillin (AMOXIL) 500 MG capsule Take 4 capsules (2,000 mg total) daily as needed by mouth (take 1 hr prior to dental cleaning). 20 capsule 1  . cholecalciferol (VITAMIN D) 1000 units tablet Take 1 tablet by mouth daily.    . cyanocobalamin (,VITAMIN B-12,) 1000 MCG/ML injection Inject 1 mL (1,000 mcg total) into the skin every 14 (fourteen) days. 10 mL 6  . DULoxetine (CYMBALTA) 60 MG capsule TAKE 1 CAPSULE EVERY DAY FOR DEPRESSION 90 capsule 3  . gabapentin (NEURONTIN) 600 MG tablet TAKE 1 TABLET THREE TIMES DAILY 270 tablet 3  . metFORMIN (GLUCOPHAGE) 500 MG tablet Take 1 tablet (500 mg total) by mouth daily with breakfast. 180 tablet 3  . methocarbamol (ROBAXIN) 500 MG tablet Take 1 tablet (500 mg total) by mouth every 8 (eight) hours as needed. 30 tablet 0  . naproxen (NAPROSYN) 500 MG tablet Take 1 tablet (500 mg total) by mouth 2 (two) times daily with a meal. 30 tablet 0  . olmesartan (BENICAR) 20 MG tablet Take 1 tablet (20 mg total) by mouth daily. 90 tablet 3  . omeprazole (PRILOSEC) 40 MG capsule TAKE 1 CAPSULE TWICE DAILY 180 capsule 3  . SYRINGE-NEEDLE, DISP, 3 ML (BD ECLIPSE SYRINGE) 25G X 1" 3 ML MISC As directed SQ for B12 shots 50 each 3  . donepezil (ARICEPT) 5 MG tablet Take 1 tablet (5 mg total) by mouth at bedtime. (Patient not taking: Reported on 10/07/2018) 90 tablet 3   Facility-Administered Medications Prior to Visit  Medication Dose Route Frequency Provider Last Rate Last Dose  . 0.9 %  sodium chloride infusion  500 mL Intravenous Continuous Milus Banister, MD        ROS: Review of Systems  Constitutional: Positive for fatigue. Negative for activity change, appetite change, chills and unexpected weight change.  HENT: Negative for congestion, mouth sores and sinus pressure.   Eyes: Negative for visual disturbance.  Respiratory: Negative for cough and chest tightness.   Gastrointestinal: Negative for abdominal pain and nausea.  Genitourinary: Negative for difficulty urinating, frequency and vaginal pain.  Musculoskeletal: Positive for arthralgias, back pain, gait problem and myalgias.  Skin: Negative for pallor and rash.  Neurological: Negative for dizziness, tremors, weakness, numbness and headaches.  Psychiatric/Behavioral: Negative for confusion, sleep disturbance and suicidal ideas. The patient is nervous/anxious.     Objective:  BP 136/78 (BP Location: Left Arm, Patient Position: Sitting, Cuff Size: Normal)   Pulse 63   Temp 97.9 F (36.6 C) (Oral)   Ht 5\' 5"  (1.651 m)   Wt 178 lb (80.7 kg)   SpO2 96%   BMI 29.62 kg/m   BP Readings from Last 3 Encounters:  10/07/18 136/78  08/28/18 134/78  07/01/18 120/72    Wt Readings from Last 3 Encounters:  10/07/18 178 lb (80.7 kg)  08/28/18 180 lb 1.3 oz (81.7 kg)  07/01/18 177 lb (80.3 kg)    Physical Exam  Constitutional: She appears well-developed. No distress.  HENT:  Head: Normocephalic.  Right Ear: External ear normal.  Left Ear: External ear normal.  Nose: Nose normal.  Mouth/Throat: Oropharynx is clear and moist.  Eyes: Pupils are equal, round, and reactive to light. Conjunctivae are normal. Right eye exhibits no discharge. Left eye exhibits no discharge.  Neck: Normal range of motion. Neck supple. No JVD present. No tracheal deviation present. No thyromegaly present.  Cardiovascular: Normal rate, regular rhythm and normal heart sounds.  Pulmonary/Chest: No stridor. No respiratory distress. She has no wheezes.  Abdominal: Soft. Bowel sounds are normal. She exhibits  no distension and no mass. There is no tenderness. There is no rebound and no guarding.  Musculoskeletal: She exhibits tenderness. She exhibits no edema.  Lymphadenopathy:    She has no cervical adenopathy.  Neurological: She displays normal reflexes. No cranial nerve deficit. She exhibits normal muscle tone. Coordination normal.  Skin: No rash noted. No erythema.  Psychiatric: Her behavior is normal. Judgment and thought content normal.  R hip tender, R shoulder tender w/ROM  Lab Results  Component Value Date   WBC 6.7 05/13/2017   HGB 14.9 05/13/2017   HCT 44.9 05/13/2017   PLT 283.0 05/13/2017   GLUCOSE 140 (H) 07/01/2018   CHOL 209 (H) 05/13/2017   TRIG 97.0 05/13/2017   HDL 47.50 05/13/2017   LDLCALC 142 (H) 05/13/2017   ALT 11 05/13/2017   AST 17 05/13/2017   NA 143 07/01/2018   K 4.6 07/01/2018   CL 107 07/01/2018   CREATININE 1.18 07/01/2018   BUN 19 07/01/2018   CO2 28 07/01/2018   TSH 2.48 05/13/2017   INR 1.02 05/26/2015   HGBA1C 6.1 05/13/2017    Dg Hip Unilat With Pelvis 2-3 Views Right  Result Date: 08/30/2018 CLINICAL DATA:  Fall this morning. Patient fell down 3 steps. Right lateral hip pain. EXAM: DG HIP (WITH OR WITHOUT PELVIS) 2-3V RIGHT COMPARISON:  None. FINDINGS: No fracture.  No bone lesion. Hip joints are normally aligned. There minor marginal osteophytes from the bases of both femoral heads. The SI joints and the symphysis pubis are normally spaced and aligned. There are scattered surgical vascular clips in the low abdomen and pelvis. IMPRESSION: 1. No fracture or dislocation. 2. Minor bilateral hip joint arthropathic change. Electronically Signed   By: Lajean Manes M.D.   On: 08/30/2018 18:14    Assessment & Plan:   There are no diagnoses linked to this encounter.   No orders of the defined types were placed in this encounter.    Follow-up: No follow-ups on file.  Walker Kehr, MD

## 2018-10-07 NOTE — Patient Instructions (Signed)

## 2018-10-07 NOTE — Assessment & Plan Note (Signed)
A1c

## 2018-10-07 NOTE — Assessment & Plan Note (Signed)
BMET 

## 2018-11-02 ENCOUNTER — Ambulatory Visit (INDEPENDENT_AMBULATORY_CARE_PROVIDER_SITE_OTHER)
Admission: RE | Admit: 2018-11-02 | Discharge: 2018-11-02 | Disposition: A | Discharge status: Home / Self Care | Payer: Self-pay | Source: Ambulatory Visit | Attending: Internal Medicine | Admitting: Internal Medicine

## 2018-11-02 DIAGNOSIS — I10 Essential (primary) hypertension: Secondary | ICD-10-CM

## 2018-11-02 DIAGNOSIS — E119 Type 2 diabetes mellitus without complications: Secondary | ICD-10-CM

## 2018-11-06 ENCOUNTER — Other Ambulatory Visit: Payer: Self-pay | Admitting: Internal Medicine

## 2019-01-07 ENCOUNTER — Other Ambulatory Visit (INDEPENDENT_AMBULATORY_CARE_PROVIDER_SITE_OTHER): Payer: Medicare HMO

## 2019-01-07 ENCOUNTER — Encounter: Payer: Self-pay | Admitting: Internal Medicine

## 2019-01-07 ENCOUNTER — Ambulatory Visit (INDEPENDENT_AMBULATORY_CARE_PROVIDER_SITE_OTHER): Payer: Medicare HMO | Admitting: Internal Medicine

## 2019-01-07 VITALS — BP 132/84 | HR 71 | Temp 97.8°F | Ht 65.0 in | Wt 178.0 lb

## 2019-01-07 DIAGNOSIS — R079 Chest pain, unspecified: Secondary | ICD-10-CM

## 2019-01-07 DIAGNOSIS — M79671 Pain in right foot: Secondary | ICD-10-CM

## 2019-01-07 DIAGNOSIS — E119 Type 2 diabetes mellitus without complications: Secondary | ICD-10-CM | POA: Diagnosis not present

## 2019-01-07 DIAGNOSIS — N183 Chronic kidney disease, stage 3 unspecified: Secondary | ICD-10-CM

## 2019-01-07 DIAGNOSIS — F411 Generalized anxiety disorder: Secondary | ICD-10-CM

## 2019-01-07 DIAGNOSIS — M79672 Pain in left foot: Secondary | ICD-10-CM | POA: Diagnosis not present

## 2019-01-07 DIAGNOSIS — D510 Vitamin B12 deficiency anemia due to intrinsic factor deficiency: Secondary | ICD-10-CM | POA: Diagnosis not present

## 2019-01-07 DIAGNOSIS — I10 Essential (primary) hypertension: Secondary | ICD-10-CM | POA: Diagnosis not present

## 2019-01-07 LAB — BASIC METABOLIC PANEL
BUN: 18 mg/dL (ref 6–23)
CO2: 28 mEq/L (ref 19–32)
Calcium: 9.3 mg/dL (ref 8.4–10.5)
Chloride: 107 mEq/L (ref 96–112)
Creatinine, Ser: 1.18 mg/dL (ref 0.40–1.20)
GFR: 44.65 mL/min — ABNORMAL LOW (ref >60.00)
Glucose, Bld: 114 mg/dL — ABNORMAL HIGH (ref 70–99)
Potassium: 4.3 mEq/L (ref 3.5–5.1)
SODIUM: 143 meq/L (ref 135–145)

## 2019-01-07 NOTE — Assessment & Plan Note (Addendum)
Chronic On Losartan, Norvasc Coronary calcium score of 0.

## 2019-01-07 NOTE — Assessment & Plan Note (Signed)
On B12 

## 2019-01-07 NOTE — Progress Notes (Signed)
Subjective:  Patient ID: Claudia [name], female    DOB: 11-May-1946  Age: 73 y.o. MRN: [id number]  CC: No chief complaint on file.   HPI Claudia [name] presents for R fingers door injury 1 mo ago F/u chronic pain, fatigue - not better C/o R abd pain x years ?[facility]  Outpatient Medications Prior to Visit  Medication Sig Dispense Refill  . amLODipine (NORVASC) 2.5 MG tablet TAKE 1 TABLET EVERY DAY 90 tablet 3  . cholecalciferol (VITAMIN D) 1000 units tablet Take 1 tablet by mouth daily.    . cyanocobalamin (,VITAMIN B-12,) 1000 MCG/ML injection Inject 1 mL (1,000 mcg total) into the skin every 14 (fourteen) days. 10 mL 6  . DULoxetine (CYMBALTA) 60 MG capsule TAKE 1 CAPSULE EVERY DAY FOR DEPRESSION 90 capsule 3  . gabapentin (NEURONTIN) 600 MG tablet TAKE 1 TABLET THREE TIMES DAILY 270 tablet 3  . metFORMIN (GLUCOPHAGE) 500 MG tablet TAKE 1 TABLET EVERY DAY WITH BREAKFAST 90 tablet 3  . naproxen (NAPROSYN) 500 MG tablet Take 1 tablet (500 mg total) by mouth 2 (two) times daily with a meal. 30 tablet 0  . omeprazole (PRILOSEC) 40 MG capsule TAKE 1 CAPSULE TWICE DAILY 180 capsule 3  . SYRINGE-NEEDLE, DISP, 3 ML (BD ECLIPSE SYRINGE) 25G X 1" 3 ML MISC As directed SQ for B12 shots 50 each 3  . olmesartan (BENICAR) 20 MG tablet Take 1 tablet (20 mg total) by mouth daily. 90 tablet 3  . amoxicillin (AMOXIL) 500 MG capsule Take 4 capsules (2,000 mg total) daily as needed by mouth (take 1 hr prior to dental cleaning). 20 capsule 1  . methocarbamol (ROBAXIN) 500 MG tablet Take 1 tablet (500 mg total) by mouth every 8 (eight) hours as needed. 30 tablet 0   Facility-Administered Medications Prior to Visit  Medication Dose Route Frequency Provider Last Rate Last Dose  . 0.9 %  sodium chloride infusion  500 mL Intravenous Continuous Milus Banister, MD        ROS: Review of Systems  Constitutional: Positive for fatigue. Negative for activity change, appetite change, chills and unexpected weight  change.  HENT: Negative for congestion, mouth sores and sinus pressure.   Eyes: Negative for visual disturbance.  Respiratory: Negative for cough and chest tightness.   Gastrointestinal: Positive for abdominal pain. Negative for nausea.  Genitourinary: Negative for difficulty urinating, frequency and vaginal pain.  Musculoskeletal: Positive for arthralgias and back pain. Negative for gait problem.  Skin: Negative for pallor and rash.  Neurological: Negative for dizziness, tremors, weakness, numbness and headaches.  Psychiatric/Behavioral: Positive for decreased concentration and sleep disturbance. Negative for confusion and suicidal ideas. The patient is nervous/anxious.     Objective:  BP 132/84 (BP Location: Left Arm, Patient Position: Sitting, Cuff Size: Normal)   Pulse 71   Temp 97.8 F (36.6 C) (Oral)   Ht 5\' 5"  (1.651 m)   Wt 178 lb (80.7 kg)   SpO2 98%   BMI 29.62 kg/m   BP Readings from Last 3 Encounters:  01/07/19 132/84  10/07/18 136/78  08/28/18 134/78    Wt Readings from Last 3 Encounters:  01/07/19 178 lb (80.7 kg)  10/07/18 178 lb (80.7 kg)  08/28/18 180 lb 1.3 oz (81.7 kg)    Physical Exam Constitutional:      General: She is not in acute distress.    Appearance: She is well-developed.  HENT:     Head: Normocephalic.     Right Ear:  External ear normal.     Left Ear: External ear normal.     Nose: Nose normal.  Eyes:     General:        Right eye: No discharge.        Left eye: No discharge.     Conjunctiva/sclera: Conjunctivae normal.     Pupils: Pupils are equal, round, and reactive to light.  Neck:     Musculoskeletal: Normal range of motion and neck supple.     Thyroid: No thyromegaly.     Vascular: No JVD.     Trachea: No tracheal deviation.  Cardiovascular:     Rate and Rhythm: Normal rate and regular rhythm.     Heart sounds: Normal heart sounds.  Pulmonary:     Effort: No respiratory distress.     Breath sounds: No stridor. No  wheezing.  Abdominal:     General: Bowel sounds are normal. There is no distension.     Palpations: Abdomen is soft. There is no mass.     Tenderness: There is no abdominal tenderness. There is no guarding or rebound.  Musculoskeletal:        General: No tenderness.  Lymphadenopathy:     Cervical: No cervical adenopathy.  Skin:    Findings: No erythema or rash.  Neurological:     Cranial Nerves: No cranial nerve deficit.     Motor: No abnormal muscle tone.     Coordination: Coordination normal.     Deep Tendon Reflexes: Reflexes normal.  Psychiatric:        Behavior: Behavior normal.        Thought Content: Thought content normal.        Judgment: Judgment normal.   R abd hurts to palpation Old blood under nails R hand R abd - NT, no mass Limp LS tender  Lab Results  Component Value Date   WBC 6.7 05/13/2017   HGB 14.9 05/13/2017   HCT 44.9 05/13/2017   PLT 283.0 05/13/2017   GLUCOSE 131 (H) 10/07/2018   CHOL 209 (H) 05/13/2017   TRIG 97.0 05/13/2017   HDL 47.50 05/13/2017   LDLCALC 142 (H) 05/13/2017   ALT 11 05/13/2017   AST 17 05/13/2017   NA 141 10/07/2018   K 4.1 10/07/2018   CL 107 10/07/2018   CREATININE 1.28 (H) 10/07/2018   BUN 26 (H) 10/07/2018   CO2 25 10/07/2018   TSH 2.48 05/13/2017   INR 1.02 05/26/2015   HGBA1C 6.0 10/07/2018    Ct Cardiac Scoring  Addendum Date: 11/02/2018   ADDENDUM REPORT: 11/02/2018 14:17 CLINICAL DATA:  Risk stratification EXAM: Coronary Calcium Score TECHNIQUE: The patient was scanned on a Siemens Somatom 64 slice scanner. Axial non-contrast 3 mm slices were carried out through the heart. The data set was analyzed on a dedicated work station and scored using the [place]. FINDINGS: Non-cardiac: See separate report from [facility]. Ascending aorta: Normal diameter 3.5 cm Pericardium: Normal Coronary arteries: No calcium noted IMPRESSION: Coronary calcium score of 0. Jenkins Rouge Electronically Signed   By:  Jenkins Rouge M.D.   On: 11/02/2018 14:17   Result Date: 11/02/2018 EXAM: OVER-READ INTERPRETATION  CT CHEST The following report is an over-read performed by radiologist Dr. Suzy Bouchard of [facility] on 11/02/2018. This over-read does not include interpretation of cardiac or coronary anatomy or pathology. The coronary calcium score interpretation by the cardiologist is attached. COMPARISON:  Chest CT 12/06/2008 FINDINGS: Limited view of the lung parenchyma  demonstrates pleural base nodule in the superior segment of the LEFT lower lobe measures 7 mm (image 11/5) which is new from comparison exam. Airways are normal. Limited view of the mediastinum demonstrates no adenopathy. Esophagus normal. Limited view of the upper abdomen unremarkable. Limited view of the skeleton and chest wall is unremarkable. IMPRESSION: Pleural nodule in the LEFT lower lobe. Typically nodules in this location are benign; however, lesion new from CT 12/06/2008. Non-contrast chest CT at 6-12 months is recommended. If the nodule is stable at time of repeat CT, then future CT at 18-24 months (from today's scan) is considered optional for low-risk patients, but is recommended for high-risk patients. This recommendation follows the consensus statement: Guidelines for Management of Incidental Pulmonary Nodules Detected on CT Images: From the Fleischner Society 2017; Radiology 2017; 284:228-243. These results will be called to the ordering clinician or representative by the Radiologist Assistant, and communication documented in the PACS or zVision Dashboard. Electronically Signed: By: Suzy Bouchard M.D. On: 11/02/2018 13:54    Assessment & Plan:   There are no diagnoses linked to this encounter.   No orders of the defined types were placed in this encounter.    Follow-up: No follow-ups on file.  Walker Kehr, MD

## 2019-01-07 NOTE — Assessment & Plan Note (Addendum)
R>L chronic Podiatry ref -- Dr Lindley [facility] per the pt's request

## 2019-01-07 NOTE — Assessment & Plan Note (Signed)
Duloxetine

## 2019-01-07 NOTE — Assessment & Plan Note (Signed)
BMET 

## 2019-01-07 NOTE — Patient Instructions (Signed)
If you have medicare related insurance (such as traditional Medicare, Blue Cross Medicare, United HealthCare Medicare, or similar), Please make an appointment at the scheduling desk with Jill, the Wellness Health Coach, for your Wellness visit in this office, which is a benefit with your insurance.  

## 2019-01-07 NOTE — Assessment & Plan Note (Signed)
Metformin 

## 2019-03-09 ENCOUNTER — Other Ambulatory Visit: Payer: Self-pay | Admitting: Internal Medicine

## 2019-04-07 IMAGING — CT CT HEART SCORING
2 series · 16 of 20 positions shown, 18 images · non-contrast
Comparison: Chest CT 12/06/2008

Addendum:
EXAM:
OVER-READ INTERPRETATION  CT CHEST

The following report is an over-read performed by radiologist Dr.
Mdjohirul Matheen [REDACTED] on 11/02/2018. This
over-read does not include interpretation of cardiac or coronary
anatomy or pathology. The coronary calcium score interpretation by
the cardiologist is attached.
CLINICAL DATA: Risk stratification
Coronary Calcium Score
TECHNIQUE: The patient was scanned on a Siemens Somatom 64 slice scanner. Axial
non-contrast 3 mm slices were carried out through the heart. The
data set was analyzed on a dedicated work station and scored using
the Agatson method.

[Series 3: casc 3.0 i36f 2 bestdiast 70 % · axial · 0.30mm/px · z∈[+1182,+1286]mm · 8 of 46 slices shown, 10 images]
[im 6/46  vessel]
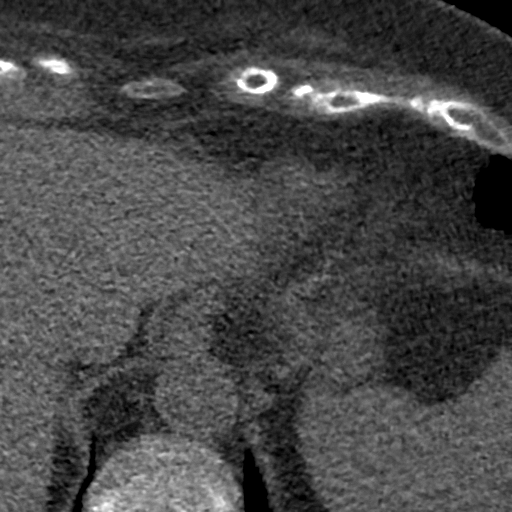
[im 6/46  lung]
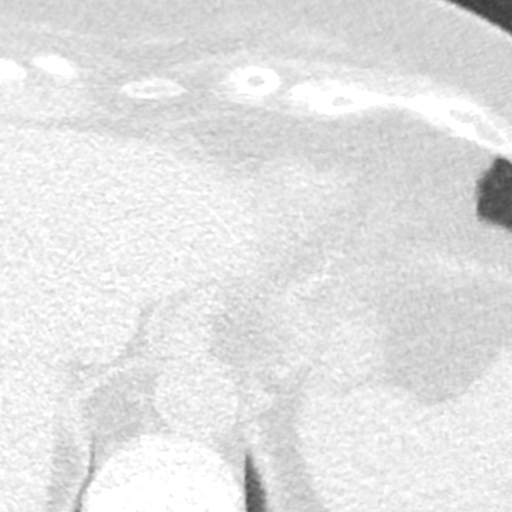
[im 11/46  vessel]
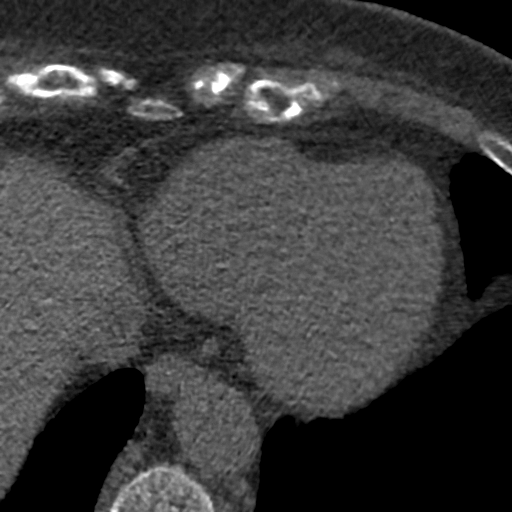
[im 16/46  vessel]
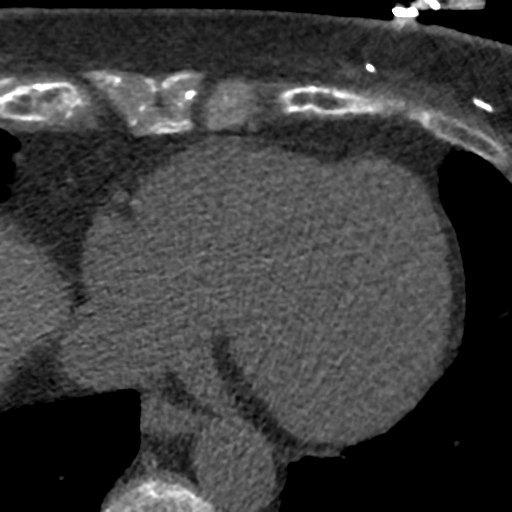
[im 21/46  vessel]
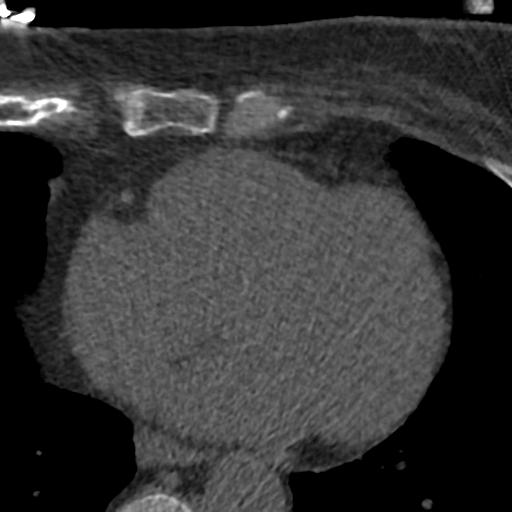
[im 26/46  vessel]
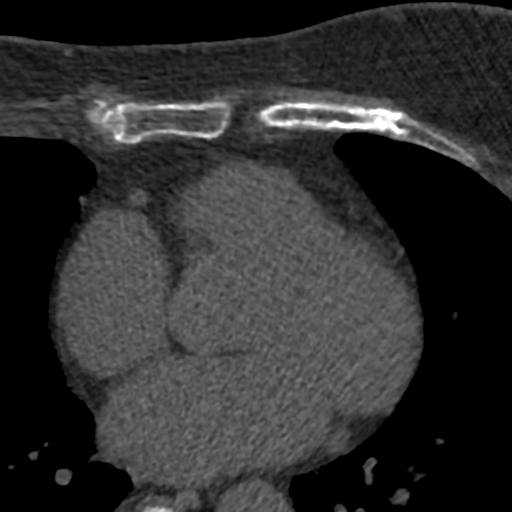
[im 26/46  lung]
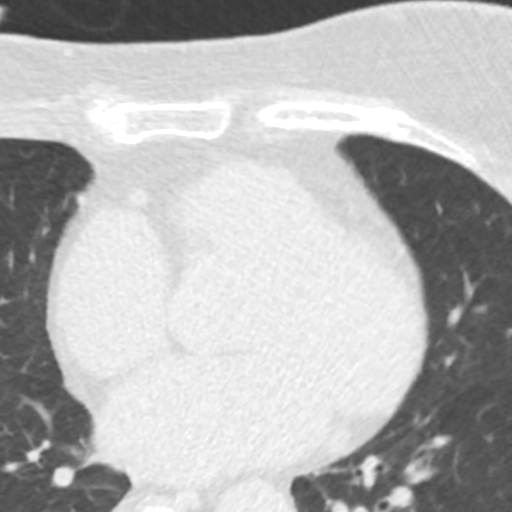
[im 31/46  vessel]
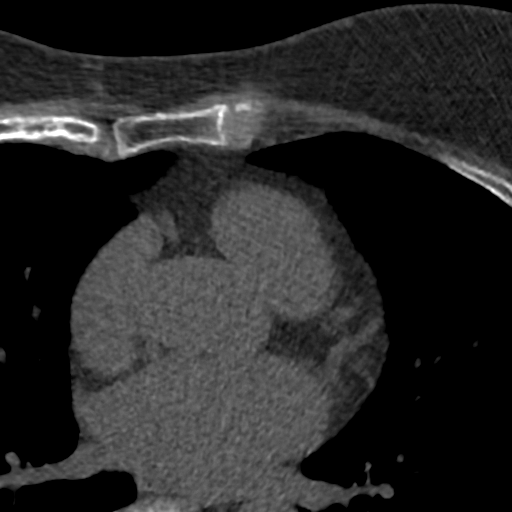
[im 36/46  vessel]
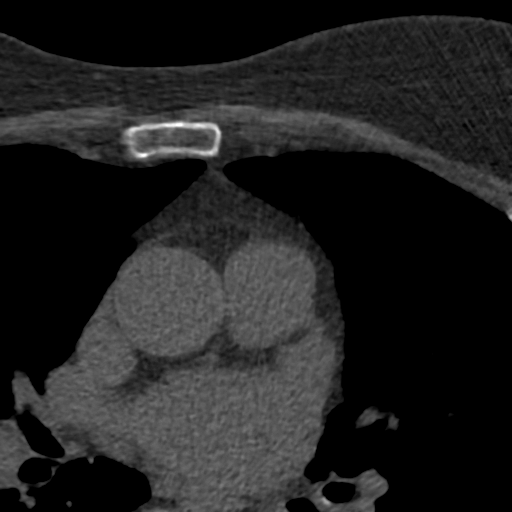
[im 41/46  vessel]
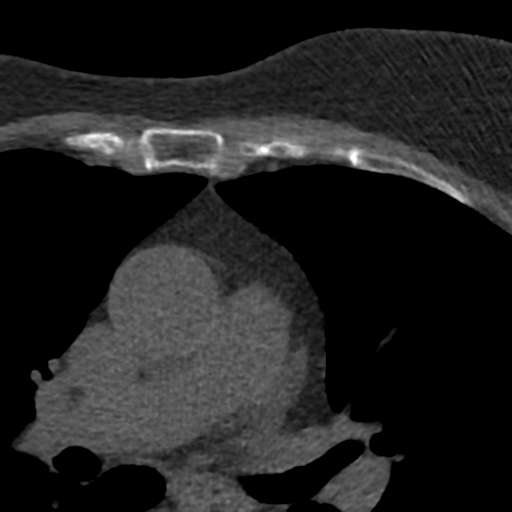

[Series 5: lung st 71 % · axial · 0.62mm/px · z∈[+1182,+1286]mm · 8 of 46 slices shown]
[im 6/46  lung]
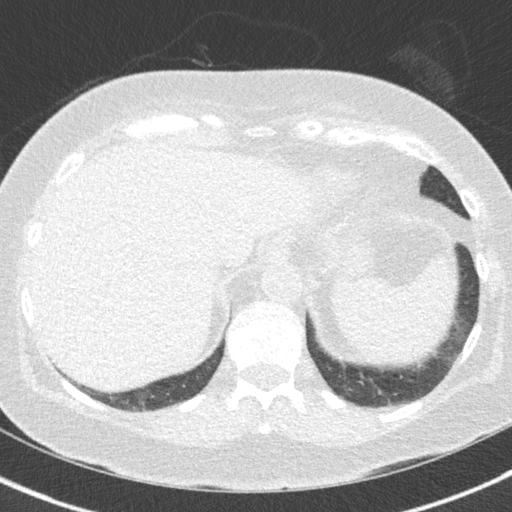
[im 11/46  lung]
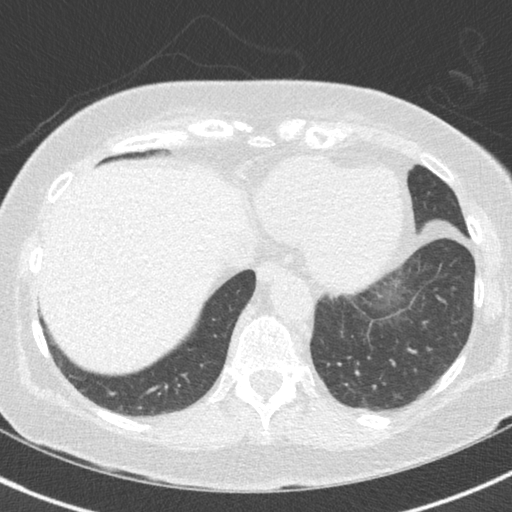
[im 16/46  lung]
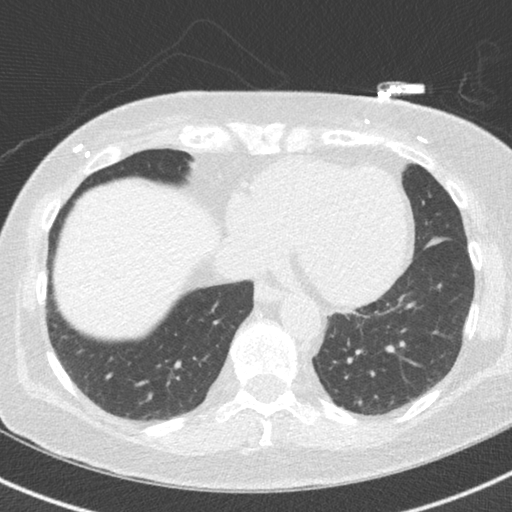
[im 21/46  lung]
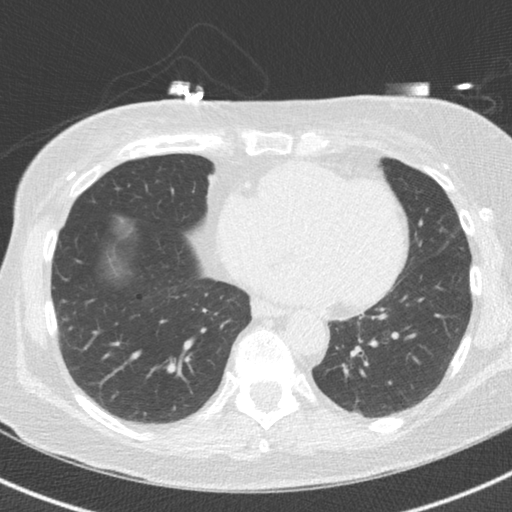
[im 26/46  lung]
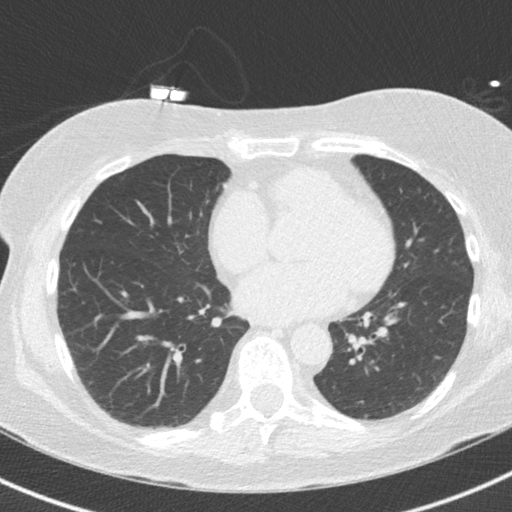
[im 31/46  lung]
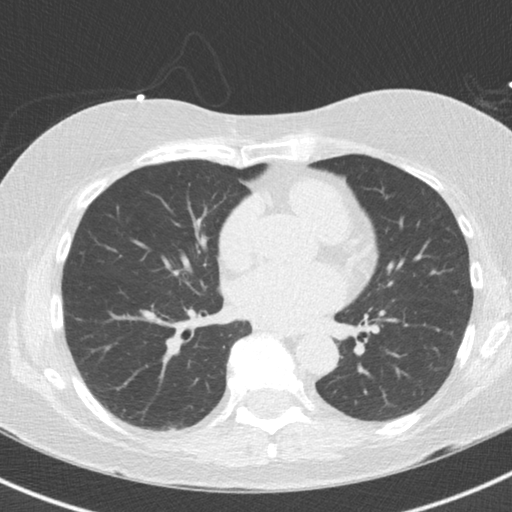
[im 36/46  lung]
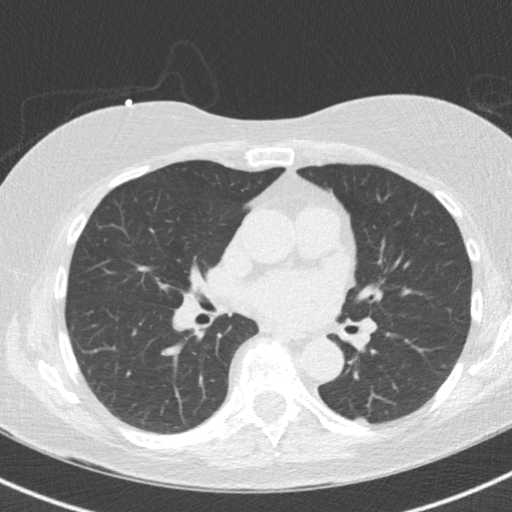
[im 41/46  lung]
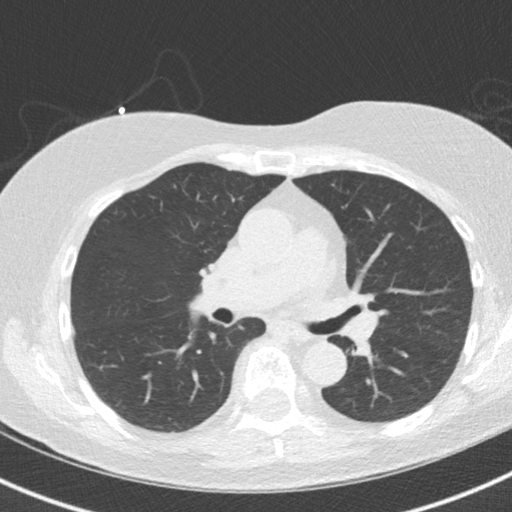

[16 of 20 positions shown; findings below may reference images not displayed]

FINDINGS: Limited view of the lung parenchyma demonstrates pleural base nodule
in the superior segment of the LEFT lower lobe measures 7 mm (image
[DATE]) which is new from comparison exam. Airways are normal.

Limited view of the mediastinum demonstrates no adenopathy.
Esophagus normal.

Limited view of the upper abdomen unremarkable.

Limited view of the skeleton and chest wall is unremarkable.
IMPRESSION: Pleural nodule in the LEFT lower lobe. Typically nodules in this
location are benign; however, lesion new from CT 12/06/2008.
Non-contrast chest CT at 6-12 months is recommended. If the nodule
is stable at time of repeat CT, then future CT at 18-24 months (from
today's scan) is considered optional for low-risk patients, but is
recommended for high-risk patients. This recommendation follows the
consensus statement: Guidelines for Management of Incidental
Pulmonary Nodules Detected on CT Images: From the [HOSPITAL]

These results will be called to the ordering clinician or
representative by the Radiologist Assistant, and communication
documented in the PACS or zVision Dashboard.
FINDINGS: Non-cardiac: See separate report from [REDACTED].

Ascending aorta: Normal diameter 3.5 cm

Pericardium: Normal

Coronary arteries: No calcium noted
IMPRESSION: Coronary calcium score of 0.

Charaabi Sandi

*** End of Addendum ***

## 2019-04-15 ENCOUNTER — Ambulatory Visit: Payer: Medicare HMO | Admitting: Internal Medicine

## 2019-05-03 ENCOUNTER — Other Ambulatory Visit: Payer: Self-pay | Admitting: Internal Medicine

## 2019-05-19 ENCOUNTER — Other Ambulatory Visit: Payer: Self-pay | Admitting: Internal Medicine

## 2019-06-10 ENCOUNTER — Telehealth: Payer: Self-pay | Admitting: Internal Medicine

## 2019-06-10 NOTE — Telephone Encounter (Signed)
The pt is unable to tolerate statins Thx

## 2019-06-10 NOTE — Telephone Encounter (Signed)
[facility]helle [facility] with [facility] of behalf of [facility] . Sharyn Lull would like to know it pt can be back on statin due to she is dm.  Sharyn Lull will also fax the form

## 2019-06-17 ENCOUNTER — Ambulatory Visit (INDEPENDENT_AMBULATORY_CARE_PROVIDER_SITE_OTHER): Payer: Medicare HMO | Admitting: Internal Medicine

## 2019-06-17 ENCOUNTER — Other Ambulatory Visit (INDEPENDENT_AMBULATORY_CARE_PROVIDER_SITE_OTHER): Payer: Medicare HMO

## 2019-06-17 ENCOUNTER — Other Ambulatory Visit: Payer: Self-pay

## 2019-06-17 ENCOUNTER — Encounter: Payer: Self-pay | Admitting: Internal Medicine

## 2019-06-17 VITALS — BP 120/80 | HR 63 | Temp 97.8°F | Ht 65.0 in | Wt 178.0 lb

## 2019-06-17 DIAGNOSIS — Z0001 Encounter for general adult medical examination with abnormal findings: Secondary | ICD-10-CM

## 2019-06-17 DIAGNOSIS — E119 Type 2 diabetes mellitus without complications: Secondary | ICD-10-CM

## 2019-06-17 DIAGNOSIS — M5481 Occipital neuralgia: Secondary | ICD-10-CM | POA: Diagnosis not present

## 2019-06-17 DIAGNOSIS — N3281 Overactive bladder: Secondary | ICD-10-CM

## 2019-06-17 DIAGNOSIS — R635 Abnormal weight gain: Secondary | ICD-10-CM

## 2019-06-17 DIAGNOSIS — E538 Deficiency of other specified B group vitamins: Secondary | ICD-10-CM

## 2019-06-17 DIAGNOSIS — R6889 Other general symptoms and signs: Secondary | ICD-10-CM | POA: Diagnosis not present

## 2019-06-17 DIAGNOSIS — Z0000 Encounter for general adult medical examination without abnormal findings: Secondary | ICD-10-CM

## 2019-06-17 DIAGNOSIS — D510 Vitamin B12 deficiency anemia due to intrinsic factor deficiency: Secondary | ICD-10-CM

## 2019-06-17 DIAGNOSIS — I10 Essential (primary) hypertension: Secondary | ICD-10-CM

## 2019-06-17 LAB — BASIC METABOLIC PANEL
BUN: 16 mg/dL (ref 6–23)
CO2: 28 mEq/L (ref 19–32)
Calcium: 9.8 mg/dL (ref 8.4–10.5)
Chloride: 105 mEq/L (ref 96–112)
Creatinine, Ser: 1.15 mg/dL (ref 0.40–1.20)
GFR: 45.94 mL/min — ABNORMAL LOW (ref >60.00)
Glucose, Bld: 125 mg/dL — ABNORMAL HIGH (ref 70–99)
Potassium: 5.1 mEq/L (ref 3.5–5.1)
Sodium: 141 mEq/L (ref 135–145)

## 2019-06-17 LAB — URINALYSIS, ROUTINE W REFLEX MICROSCOPIC
Bilirubin Urine: NEGATIVE
Ketones, ur: NEGATIVE
Nitrite: NEGATIVE
Specific Gravity, Urine: 1.015 (ref 1.000–1.030)
Total Protein, Urine: NEGATIVE
Urine Glucose: NEGATIVE
Urobilinogen, UA: 0.2 (ref 0.0–1.0)
pH: 6.5 (ref 5.0–8.0)

## 2019-06-17 LAB — CBC WITH DIFFERENTIAL/PLATELET
Basophils Absolute: 0 10*3/uL (ref 0.0–0.1)
Basophils Relative: 0.4 % (ref 0.0–3.0)
Eosinophils Absolute: 0.1 10*3/uL (ref 0.0–0.7)
Eosinophils Relative: 2 % (ref 0.0–5.0)
HCT: 42.8 % (ref 36.0–46.0)
Hemoglobin: 14.2 g/dL (ref 12.0–15.0)
Lymphocytes Relative: 30.4 % (ref 12.0–46.0)
Lymphs Abs: 1.8 10*3/uL (ref 0.7–4.0)
MCHC: 33.3 g/dL (ref 30.0–36.0)
MCV: 92 fl (ref 78.0–100.0)
Monocytes Absolute: 0.3 10*3/uL (ref 0.1–1.0)
Monocytes Relative: 5.9 % (ref 3.0–12.0)
Neutro Abs: 3.6 10*3/uL (ref 1.4–7.7)
Neutrophils Relative %: 61.3 % (ref 43.0–77.0)
Platelets: 308 10*3/uL (ref 150.0–400.0)
RBC: 4.66 Mil/uL (ref 3.87–5.11)
RDW: 14.3 % (ref 11.5–15.5)
WBC: 5.9 10*3/uL (ref 4.0–10.5)

## 2019-06-17 LAB — LIPID PANEL
Cholesterol: 208 mg/dL — ABNORMAL HIGH (ref 0–200)
HDL: 48.2 mg/dL (ref >39.00)
LDL Cholesterol: 141 mg/dL — ABNORMAL HIGH (ref 0–99)
NonHDL: 159.65
Total CHOL/HDL Ratio: 4
Triglycerides: 95 mg/dL (ref 0.0–149.0)
VLDL: 19 mg/dL (ref 0.0–40.0)

## 2019-06-17 LAB — HEPATIC FUNCTION PANEL
ALT: 13 U/L (ref 0–35)
AST: 20 U/L (ref 0–37)
Albumin: 4.4 g/dL (ref 3.5–5.2)
Alkaline Phosphatase: 78 U/L (ref 39–117)
Bilirubin, Direct: 0.1 mg/dL (ref 0.0–0.3)
Total Bilirubin: 0.6 mg/dL (ref 0.2–1.2)
Total Protein: 7.1 g/dL (ref 6.0–8.3)

## 2019-06-17 LAB — TSH: TSH: 3.81 u[IU]/mL (ref 0.35–4.50)

## 2019-06-17 LAB — HEMOGLOBIN A1C: Hgb A1c MFr Bld: 6.1 % (ref 4.6–6.5)

## 2019-06-17 MED ORDER — CYANOCOBALAMIN 1000 MCG/ML IJ SOLN: 1000.0000 ug | INTRAMUSCULAR | 6 refills | Status: DC

## 2019-06-17 MED ORDER — MIRABEGRON ER 50 MG PO TB24: 50.0000 mg | ORAL_TABLET | Freq: Every day | ORAL | 11 refills | Status: DC

## 2019-06-17 MED ORDER — CYANOCOBALAMIN 1000 MCG/ML IJ SOLN
1000.0000 ug | Freq: Once | INTRAMUSCULAR | Status: AC
  Administered 2019-06-17: 1000 ug via INTRAMUSCULAR

## 2019-06-17 MED ORDER — METHYLPREDNISOLONE ACETATE 40 MG/ML IJ SUSP
40.0000 mg | Freq: Once | INTRAMUSCULAR | Status: AC
  Administered 2019-06-17: 20 mg via INTRAMUSCULAR

## 2019-06-17 MED ORDER — MEMANTINE HCL 10 MG PO TABS: 10.0000 mg | ORAL_TABLET | Freq: Two times a day (BID) | ORAL | 11 refills | Status: DC

## 2019-06-17 MED ORDER — "BD ECLIPSE SYRINGE 25G X 1"" 3 ML MISC": 3 refills | Status: AC

## 2019-06-17 NOTE — Assessment & Plan Note (Signed)
Losartan, Norvasc 

## 2019-06-17 NOTE — Assessment & Plan Note (Signed)
Metformin Labs 

## 2019-06-17 NOTE — Assessment & Plan Note (Signed)
See procedure 

## 2019-06-17 NOTE — Addendum Note (Signed)
Addended by: Cresenciano Lick on: 06/17/2019 10:34 AM   Modules accepted: Orders

## 2019-06-17 NOTE — Assessment & Plan Note (Signed)
We discussed age appropriate health related issues, including available/recomended screening tests and vaccinations. We discussed a need for adhering to healthy diet and exercise. Labs were ordered to be later reviewed . All questions were answered.   

## 2019-06-17 NOTE — Assessment & Plan Note (Signed)
B 12

## 2019-06-17 NOTE — Patient Instructions (Addendum)
These suggestions will probably help you to improve your metabolism if you are not overweight and to lose weight if you are overweight: 1.  Reduce your consumption of sugars and starches.  Eliminate high fructose corn syrup from your diet.  Reduce your consumption of processed foods.  For desserts try to have seasonal fruits, berries, nuts, cheeses or dark chocolate with more than 70% cacao. 2.  Do not snack 3.  You do not have to eat breakfast.  If you choose to have breakfast-eat plain greek yogurt, eggs, oatmeal (without sugar) 4.  Drink water, freshly brewed unsweetened tea (green, black or herbal) or coffee.  Do not drink sodas including diet sodas , juices, beverages sweetened with artificial sweeteners. 5.  Reduce your consumption of refined grains. 6.  Avoid protein drinks such as Optifast, Slim fast etc. Eat chicken, fish, meat, dairy and beans for your sources of protein 7.  Natural unprocessed fats like cold pressed virgin olive oil, butter, coconut oil are good for you.  Eat avocados 8.  Increase your consumption of fiber.  Fruits, berries, vegetables, whole grains, flaxseeds, Chia seeds, beans, popcorn, nuts, oatmeal are good sources of fiber 9.  Use vinegar in your diet, i.e. apple cider vinegar, red wine or balsamic vinegar 10.  You can try fasting.  For example you can skip breakfast and lunch every other day (24-hour fast) 11.  Stress reduction, good night sleep, relaxation, meditation, yoga and other physical activity is likely to help you to maintain low weight too. 12.  If you drink alcohol, limit your alcohol intake to no more than 2 drinks a day.   Mediterranean diet is good for you. ([employer] has a typical Mediterranean cuisine menu) The Mediterranean diet is a way of eating based on the traditional cuisine of countries bordering the [place] Sea. While there is no single definition of the Mediterranean diet, it is typically high in vegetables, fruits, whole grains,  beans, nut and seeds, and olive oil. The main components of Mediterranean diet include: . Daily consumption of vegetables, fruits, whole grains and healthy fats  . Weekly intake of fish, poultry, beans and eggs  . Moderate portions of dairy products  . Limited intake of red meat Other important elements of the Mediterranean diet are sharing meals with family and friends, enjoying a glass of red wine and being physically active. Health benefits of a Mediterranean diet: A traditional Mediterranean diet consisting of large quantities of fresh fruits and vegetables, nuts, fish and olive oil-coupled with physical activity-can reduce your risk of serious mental and physical health problems by: Preventing heart disease and strokes. Following a Mediterranean diet limits your intake of refined breads, processed foods, and red meat, and encourages drinking red wine instead of hard liquor-all factors that can help prevent heart disease and stroke. Keeping you agile. If you're an older adult, the nutrients gained with a Mediterranean diet may reduce your risk of developing muscle weakness and other signs of frailty by about 70 percent. Reducing the risk of Alzheimer's. Research suggests that the Mediterranean diet may improve cholesterol, blood sugar levels, and overall blood vessel health, which in turn may reduce your risk of Alzheimer's disease or dementia. Halving the risk of Parkinson's disease. The high levels of antioxidants in the Mediterranean diet can prevent cells from undergoing a damaging process called oxidative stress, thereby cutting the risk of Parkinson's disease in half. Increasing longevity. By reducing your risk of developing heart disease or cancer with the Mediterranean diet,   you're reducing your risk of death at any age by 20%. Protecting against type 2 diabetes. A Mediterranean diet is rich in fiber which digests slowly, prevents huge swings in blood sugar, and can help you maintain a  healthy weight.    Cabbage soup recipe that will not make you gain weight: Take 1 small head of cabbage, 1 average pack of celery, 4 green peppers, 4 onions, 2 cans diced tomatoes (they are not available without salt), salt and spices to taste.  Chop cabbage, celery, peppers and onions.  And tomatoes and 2-2.5 liters (2.5 quarts) of water so that it would just cover the vegetables.  Bring to boil.  Add spices and salt.  Turn heat to low/medium and simmer for 20-25 minutes.  Naturally, you can make a smaller batch and change some of the ingredients.   If you have medicare related insurance (such as traditional Medicare, Blue Cross Medicare, United HealthCare Medicare, or similar), Please make an appointment at the scheduling desk with Jill, the Wellness Health Coach, for your Wellness visit in this office, which is a benefit with your insurance.  

## 2019-06-17 NOTE — Assessment & Plan Note (Signed)
Wt Readings from Last 3 Encounters:  06/17/19 178 lb (80.7 kg)  01/07/19 178 lb (80.7 kg)  10/07/18 178 lb (80.7 kg)

## 2019-06-17 NOTE — Assessment & Plan Note (Signed)
Will try Myrbetriq.

## 2019-06-17 NOTE — Progress Notes (Signed)
Subjective:  Patient ID: Claudia [name], female    DOB: 1946-01-19  Age: 73 y.o. MRN: [id number]  CC: No chief complaint on file.   HPI Justis B Bottari presents for OAB, OA, memory issues f/u Well exam C/o HAs on the R x months  Outpatient Medications Prior to Visit  Medication Sig Dispense Refill  . amLODipine (NORVASC) 2.5 MG tablet TAKE 1 TABLET EVERY DAY 90 tablet 3  . cholecalciferol (VITAMIN D) 1000 units tablet Take 1 tablet by mouth daily.    . cyanocobalamin (,VITAMIN B-12,) 1000 MCG/ML injection Inject 1 mL (1,000 mcg total) into the skin every 14 (fourteen) days. 10 mL 6  . donepezil (ARICEPT) 5 MG tablet TAKE 1 TABLET AT BEDTIME 90 tablet 3  . DULoxetine (CYMBALTA) 60 MG capsule TAKE 1 CAPSULE EVERY DAY FOR DEPRESSION 90 capsule 3  . gabapentin (NEURONTIN) 600 MG tablet TAKE 1 TABLET THREE TIMES DAILY 270 tablet 3  . metFORMIN (GLUCOPHAGE) 500 MG tablet TAKE 1 TABLET EVERY DAY WITH BREAKFAST 90 tablet 3  . naproxen (NAPROSYN) 500 MG tablet Take 1 tablet (500 mg total) by mouth 2 (two) times daily with a meal. 30 tablet 0  . olmesartan (BENICAR) 20 MG tablet TAKE 1 TABLET EVERY DAY 90 tablet 3  . omeprazole (PRILOSEC) 40 MG capsule TAKE 1 CAPSULE TWICE DAILY 180 capsule 3  . SYRINGE-NEEDLE, DISP, 3 ML (BD ECLIPSE SYRINGE) 25G X 1" 3 ML MISC As directed SQ for B12 shots 50 each 3   Facility-Administered Medications Prior to Visit  Medication Dose Route Frequency Provider Last Rate Last Dose  . 0.9 %  sodium chloride infusion  500 mL Intravenous Continuous Milus Banister, MD        ROS: Review of Systems  Constitutional: Positive for fatigue. Negative for activity change, appetite change, chills and unexpected weight change.  HENT: Negative for congestion, mouth sores and sinus pressure.   Eyes: Negative for visual disturbance.  Respiratory: Negative for cough and chest tightness.   Gastrointestinal: Negative for abdominal pain and nausea.  Genitourinary: Positive  for frequency and urgency. Negative for difficulty urinating and vaginal pain.  Musculoskeletal: Positive for arthralgias and gait problem. Negative for back pain.  Skin: Negative for pallor and rash.  Neurological: Negative for dizziness, tremors, weakness, numbness and headaches.  Psychiatric/Behavioral: Negative for confusion, sleep disturbance and suicidal ideas. The patient is nervous/anxious.     Objective:  BP 120/80 (BP Location: Left Arm, Patient Position: Sitting, Cuff Size: Large)   Pulse 63   Temp 97.8 F (36.6 C) (Oral)   Ht 5\' 5"  (1.651 m)   Wt 178 lb (80.7 kg)   SpO2 96%   BMI 29.62 kg/m   BP Readings from Last 3 Encounters:  06/17/19 120/80  01/07/19 132/84  10/07/18 136/78    Wt Readings from Last 3 Encounters:  06/17/19 178 lb (80.7 kg)  01/07/19 178 lb (80.7 kg)  10/07/18 178 lb (80.7 kg)    Physical Exam Constitutional:      General: She is not in acute distress.    Appearance: She is well-developed.  HENT:     Head: Normocephalic.     Right Ear: External ear normal.     Left Ear: External ear normal.     Nose: Nose normal.  Eyes:     General:        Right eye: No discharge.        Left eye: No discharge.  Conjunctiva/sclera: Conjunctivae normal.     Pupils: Pupils are equal, round, and reactive to light.  Neck:     Musculoskeletal: Normal range of motion and neck supple.     Thyroid: No thyromegaly.     Vascular: No JVD.     Trachea: No tracheal deviation.  Cardiovascular:     Rate and Rhythm: Normal rate and regular rhythm.     Heart sounds: Normal heart sounds.  Pulmonary:     Effort: No respiratory distress.     Breath sounds: No stridor. No wheezing.  Abdominal:     General: Bowel sounds are normal. There is no distension.     Palpations: Abdomen is soft. There is no mass.     Tenderness: There is no abdominal tenderness. There is no guarding or rebound.  Musculoskeletal:        General: No tenderness.  Lymphadenopathy:      Cervical: No cervical adenopathy.  Skin:    Findings: No erythema or rash.  Neurological:     Mental Status: She is oriented to person, place, and time.     Cranial Nerves: No cranial nerve deficit.     Motor: No abnormal muscle tone.     Coordination: Coordination normal.     Gait: Gait abnormal.     Deep Tendon Reflexes: Reflexes normal.  Psychiatric:        Behavior: Behavior normal.        Thought Content: Thought content normal.        Judgment: Judgment normal.   knees - tender R post scalp - painful  Lab Results  Component Value Date   WBC 6.7 05/13/2017   HGB 14.9 05/13/2017   HCT 44.9 05/13/2017   PLT 283.0 05/13/2017   GLUCOSE 114 (H) 01/07/2019   CHOL 209 (H) 05/13/2017   TRIG 97.0 05/13/2017   HDL 47.50 05/13/2017   LDLCALC 142 (H) 05/13/2017   ALT 11 05/13/2017   AST 17 05/13/2017   NA 143 01/07/2019   K 4.3 01/07/2019   CL 107 01/07/2019   CREATININE 1.18 01/07/2019   BUN 18 01/07/2019   CO2 28 01/07/2019   TSH 2.48 05/13/2017   INR 1.02 05/26/2015   HGBA1C 6.0 10/07/2018    Ct Cardiac Scoring  Addendum Date: 11/02/2018   ADDENDUM REPORT: 11/02/2018 14:17 CLINICAL DATA:  Risk stratification EXAM: Coronary Calcium Score TECHNIQUE: The patient was scanned on a Siemens Somatom 64 slice scanner. Axial non-contrast 3 mm slices were carried out through the heart. The data set was analyzed on a dedicated work station and scored using the [place]. FINDINGS: Non-cardiac: See separate report from [facility]. Ascending aorta: Normal diameter 3.5 cm Pericardium: Normal Coronary arteries: No calcium noted IMPRESSION: Coronary calcium score of 0. Jenkins Rouge Electronically Signed   By: Jenkins Rouge M.D.   On: 11/02/2018 14:17   Result Date: 11/02/2018 EXAM: OVER-READ INTERPRETATION  CT CHEST The following report is an over-read performed by radiologist Dr. Suzy Bouchard of [facility] on 11/02/2018. This over-read does not include  interpretation of cardiac or coronary anatomy or pathology. The coronary calcium score interpretation by the cardiologist is attached. COMPARISON:  Chest CT 12/06/2008 FINDINGS: Limited view of the lung parenchyma demonstrates pleural base nodule in the superior segment of the LEFT lower lobe measures 7 mm (image 11/5) which is new from comparison exam. Airways are normal. Limited view of the mediastinum demonstrates no adenopathy. Esophagus normal. Limited view of the upper abdomen unremarkable. Limited  view of the skeleton and chest wall is unremarkable. IMPRESSION: Pleural nodule in the LEFT lower lobe. Typically nodules in this location are benign; however, lesion new from CT 12/06/2008. Non-contrast chest CT at 6-12 months is recommended. If the nodule is stable at time of repeat CT, then future CT at 18-24 months (from today's scan) is considered optional for low-risk patients, but is recommended for high-risk patients. This recommendation follows the consensus statement: Guidelines for Management of Incidental Pulmonary Nodules Detected on CT Images: From the Fleischner Society 2017; Radiology 2017; 284:228-243. These results will be called to the ordering clinician or representative by the Radiologist Assistant, and communication documented in the PACS or zVision Dashboard. Electronically Signed: By: Suzy Bouchard M.D. On: 11/02/2018 13:54    Procedure Note :     occipital nerve Injection:   Indication : R occipital neuralgia.   Risks including unsuccessful procedure , bleeding, infection, bruising, skin atrophy and others were explained to the patient in detail as well as the benefits. Informed consent was obtained and signed.   Tthe patient was placed in a comfortable position.  occip nerve exit point was marked and  the skin was prepped with Betadine and alcohol. 11/2 inch 25-gauge needle was used. The needle was advanced perpendicular to the skin and I injected the site with 2 mL of 2%  lidocaine and 20 mg of Depo-Medrol in a usual fashion.  Band-Aid applied.   Tolerated well. Complications: None. Good pain relief following the procedure.   Assessment & Plan:   There are no diagnoses linked to this encounter.   No orders of the defined types were placed in this encounter.    Follow-up: No follow-ups on file.  Walker Kehr, MD

## 2019-07-26 ENCOUNTER — Other Ambulatory Visit: Payer: Self-pay | Admitting: Internal Medicine

## 2019-08-18 ENCOUNTER — Other Ambulatory Visit: Payer: Self-pay | Admitting: Internal Medicine

## 2019-09-21 ENCOUNTER — Other Ambulatory Visit (INDEPENDENT_AMBULATORY_CARE_PROVIDER_SITE_OTHER): Payer: Medicare HMO

## 2019-09-21 ENCOUNTER — Encounter: Payer: Self-pay | Admitting: Internal Medicine

## 2019-09-21 ENCOUNTER — Ambulatory Visit (INDEPENDENT_AMBULATORY_CARE_PROVIDER_SITE_OTHER): Payer: Medicare HMO | Admitting: Internal Medicine

## 2019-09-21 ENCOUNTER — Other Ambulatory Visit: Payer: Self-pay

## 2019-09-21 VITALS — BP 122/78 | HR 59 | Temp 98.0°F | Ht 65.0 in | Wt 180.0 lb

## 2019-09-21 DIAGNOSIS — R635 Abnormal weight gain: Secondary | ICD-10-CM | POA: Diagnosis not present

## 2019-09-21 DIAGNOSIS — N2889 Other specified disorders of kidney and ureter: Secondary | ICD-10-CM

## 2019-09-21 DIAGNOSIS — I10 Essential (primary) hypertension: Secondary | ICD-10-CM

## 2019-09-21 DIAGNOSIS — N183 Chronic kidney disease, stage 3 unspecified: Secondary | ICD-10-CM

## 2019-09-21 DIAGNOSIS — L600 Ingrowing nail: Secondary | ICD-10-CM | POA: Diagnosis not present

## 2019-09-21 DIAGNOSIS — R5382 Chronic fatigue, unspecified: Secondary | ICD-10-CM | POA: Diagnosis not present

## 2019-09-21 DIAGNOSIS — E119 Type 2 diabetes mellitus without complications: Secondary | ICD-10-CM

## 2019-09-21 DIAGNOSIS — R413 Other amnesia: Secondary | ICD-10-CM | POA: Diagnosis not present

## 2019-09-21 DIAGNOSIS — R202 Paresthesia of skin: Secondary | ICD-10-CM | POA: Diagnosis not present

## 2019-09-21 DIAGNOSIS — F329 Major depressive disorder, single episode, unspecified: Secondary | ICD-10-CM | POA: Diagnosis not present

## 2019-09-21 LAB — CBC WITH DIFFERENTIAL/PLATELET
Basophils Absolute: 0 10*3/uL (ref 0.0–0.1)
Basophils Relative: 0.7 % (ref 0.0–3.0)
Eosinophils Absolute: 0.1 10*3/uL (ref 0.0–0.7)
Eosinophils Relative: 2.6 % (ref 0.0–5.0)
HCT: 42.3 % (ref 36.0–46.0)
Hemoglobin: 14.1 g/dL (ref 12.0–15.0)
Lymphocytes Relative: 26.8 % (ref 12.0–46.0)
Lymphs Abs: 1.4 10*3/uL (ref 0.7–4.0)
MCHC: 33.4 g/dL (ref 30.0–36.0)
MCV: 92.4 fl (ref 78.0–100.0)
Monocytes Absolute: 0.3 10*3/uL (ref 0.1–1.0)
Monocytes Relative: 6.1 % (ref 3.0–12.0)
Neutro Abs: 3.3 10*3/uL (ref 1.4–7.7)
Neutrophils Relative %: 63.8 % (ref 43.0–77.0)
Platelets: 295 10*3/uL (ref 150.0–400.0)
RBC: 4.58 Mil/uL (ref 3.87–5.11)
RDW: 13.5 % (ref 11.5–15.5)
WBC: 5.2 10*3/uL (ref 4.0–10.5)

## 2019-09-21 LAB — BASIC METABOLIC PANEL
BUN: 22 mg/dL (ref 6–23)
CO2: 28 mEq/L (ref 19–32)
Calcium: 9 mg/dL (ref 8.4–10.5)
Chloride: 105 mEq/L (ref 96–112)
Creatinine, Ser: 1.1 mg/dL (ref 0.40–1.20)
GFR: 48.33 mL/min — ABNORMAL LOW (ref >60.00)
Glucose, Bld: 125 mg/dL — ABNORMAL HIGH (ref 70–99)
Potassium: 4.3 mEq/L (ref 3.5–5.1)
Sodium: 140 mEq/L (ref 135–145)

## 2019-09-21 LAB — T4, FREE: Free T4: 0.93 ng/dL (ref 0.60–1.60)

## 2019-09-21 LAB — TSH: TSH: 3.17 u[IU]/mL (ref 0.35–4.50)

## 2019-09-21 LAB — VITAMIN B12: Vitamin B-12: 561 pg/mL (ref 211–911)

## 2019-09-21 LAB — HEMOGLOBIN A1C: Hgb A1c MFr Bld: 5.8 % (ref 4.6–6.5)

## 2019-09-21 NOTE — Patient Instructions (Signed)

## 2019-09-21 NOTE — Assessment & Plan Note (Signed)
Cymbalta 

## 2019-09-21 NOTE — Assessment & Plan Note (Signed)
Diet discussed 

## 2019-09-21 NOTE — Assessment & Plan Note (Signed)
On Losartan, Norvasc Coronary calcium score of 0.

## 2019-09-21 NOTE — Progress Notes (Signed)
Subjective:  Patient ID: Claudia [name], female    DOB: [birth year]  Age: 73 y.o. MRN: [id number]  CC: No chief complaint on file.   HPI Claudia [name] presents for anxiety, HTN, B12 def C/o being sleepy - worse after breakfast C/o L big toe ingrown toenail  Outpatient Medications Prior to Visit  Medication Sig Dispense Refill  . amLODipine (NORVASC) 2.5 MG tablet TAKE 1 TABLET EVERY DAY 90 tablet 3  . cholecalciferol (VITAMIN D) 1000 units tablet Take 1 tablet by mouth daily.    . cyanocobalamin (,VITAMIN B-12,) 1000 MCG/ML injection Inject 1 mL (1,000 mcg total) into the skin every 14 (fourteen) days. 10 mL 6  . DULoxetine (CYMBALTA) 60 MG capsule TAKE 1 CAPSULE EVERY DAY FOR DEPRESSION 90 capsule 3  . gabapentin (NEURONTIN) 600 MG tablet TAKE 1 TABLET THREE TIMES DAILY 270 tablet 3  . metFORMIN (GLUCOPHAGE) 500 MG tablet TAKE 1 TABLET EVERY DAY WITH BREAKFAST 90 tablet 3  . naproxen (NAPROSYN) 500 MG tablet Take 1 tablet (500 mg total) by mouth 2 (two) times daily with a meal. 30 tablet 0  . olmesartan (BENICAR) 20 MG tablet TAKE 1 TABLET EVERY DAY 90 tablet 3  . omeprazole (PRILOSEC) 40 MG capsule TAKE 1 CAPSULE TWICE DAILY 180 capsule 3  . SYRINGE-NEEDLE, DISP, 3 ML (BD ECLIPSE SYRINGE) 25G X 1" 3 ML MISC As directed SQ for B12 shots 50 each 3  . memantine (NAMENDA) 10 MG tablet Take 1 tablet (10 mg total) by mouth 2 (two) times daily. (Patient not taking: Reported on 09/21/2019) 60 tablet 11  . mirabegron ER (MYRBETRIQ) 50 MG TB24 tablet Take 1 tablet (50 mg total) by mouth daily. (Patient not taking: Reported on 09/21/2019) 30 tablet 11   Facility-Administered Medications Prior to Visit  Medication Dose Route Frequency Provider Last Rate Last Dose  . 0.9 %  sodium chloride infusion  500 mL Intravenous Continuous Milus Banister, MD        ROS: Review of Systems  Constitutional: Positive for fatigue. Negative for activity change, appetite change, chills and unexpected weight  change.  HENT: Negative for congestion, mouth sores and sinus pressure.   Eyes: Negative for visual disturbance.  Respiratory: Negative for cough and chest tightness.   Gastrointestinal: Negative for abdominal pain and nausea.  Genitourinary: Negative for difficulty urinating, frequency and vaginal pain.  Musculoskeletal: Negative for back pain and gait problem.  Skin: Negative for pallor and rash.  Neurological: Negative for dizziness, tremors, weakness, numbness and headaches.  Psychiatric/Behavioral: Negative for confusion and sleep disturbance.    Objective:  BP 122/78 (BP Location: Left Arm, Patient Position: Sitting, Cuff Size: Normal)   Pulse (!) 59   Temp 98 F (36.7 C) (Oral)   Ht 5\' 5"  (1.651 m)   Wt 180 lb (81.6 kg)   SpO2 99%   BMI 29.95 kg/m   BP Readings from Last 3 Encounters:  09/21/19 122/78  06/17/19 120/80  01/07/19 132/84    Wt Readings from Last 3 Encounters:  09/21/19 180 lb (81.6 kg)  06/17/19 178 lb (80.7 kg)  01/07/19 178 lb (80.7 kg)    Physical Exam Constitutional:      General: She is not in acute distress.    Appearance: She is well-developed.  HENT:     Head: Normocephalic.     Right Ear: External ear normal.     Left Ear: External ear normal.     Nose: Nose normal.  Eyes:  General:        Right eye: No discharge.        Left eye: No discharge.     Conjunctiva/sclera: Conjunctivae normal.     Pupils: Pupils are equal, round, and reactive to light.  Neck:     Musculoskeletal: Normal range of motion and neck supple.     Thyroid: No thyromegaly.     Vascular: No JVD.     Trachea: No tracheal deviation.  Cardiovascular:     Rate and Rhythm: Normal rate and regular rhythm.     Heart sounds: Normal heart sounds.  Pulmonary:     Effort: No respiratory distress.     Breath sounds: No stridor. No wheezing.  Abdominal:     General: Bowel sounds are normal. There is no distension.     Palpations: Abdomen is soft. There is no mass.      Tenderness: There is no abdominal tenderness. There is no guarding or rebound.  Musculoskeletal:        General: No tenderness.  Lymphadenopathy:     Cervical: No cervical adenopathy.  Skin:    Findings: No erythema or rash.  Neurological:     Cranial Nerves: No cranial nerve deficit.     Motor: No abnormal muscle tone.     Coordination: Coordination normal.     Deep Tendon Reflexes: Reflexes normal.  Psychiatric:        Behavior: Behavior normal.        Thought Content: Thought content normal.        Judgment: Judgment normal.   L big toe ingrown toenail abd - no hernia  Lab Results  Component Value Date   WBC 5.9 06/17/2019   HGB 14.2 06/17/2019   HCT 42.8 06/17/2019   PLT 308.0 06/17/2019   GLUCOSE 125 (H) 06/17/2019   CHOL 208 (H) 06/17/2019   TRIG 95.0 06/17/2019   HDL 48.20 06/17/2019   LDLCALC 141 (H) 06/17/2019   ALT 13 06/17/2019   AST 20 06/17/2019   NA 141 06/17/2019   K 5.1 06/17/2019   CL 105 06/17/2019   CREATININE 1.15 06/17/2019   BUN 16 06/17/2019   CO2 28 06/17/2019   TSH 3.81 06/17/2019   INR 1.02 05/26/2015   HGBA1C 6.1 06/17/2019    Ct Cardiac Scoring  Addendum Date: 11/02/2018   ADDENDUM REPORT: 11/02/2018 14:17 CLINICAL DATA:  Risk stratification EXAM: Coronary Calcium Score TECHNIQUE: The patient was scanned on a Siemens Somatom 64 slice scanner. Axial non-contrast 3 mm slices were carried out through the heart. The data set was analyzed on a dedicated work station and scored using the [place]. FINDINGS: Non-cardiac: See separate report from [facility]. Ascending aorta: Normal diameter 3.5 cm Pericardium: Normal Coronary arteries: No calcium noted IMPRESSION: Coronary calcium score of 0. Jenkins Rouge Electronically Signed   By: Jenkins Rouge M.D.   On: 11/02/2018 14:17   Result Date: 11/02/2018 EXAM: OVER-READ INTERPRETATION  CT CHEST The following report is an over-read performed by radiologist Dr. Suzy Bouchard of  [facility] on 11/02/2018. This over-read does not include interpretation of cardiac or coronary anatomy or pathology. The coronary calcium score interpretation by the cardiologist is attached. COMPARISON:  Chest CT 12/06/2008 FINDINGS: Limited view of the lung parenchyma demonstrates pleural base nodule in the superior segment of the LEFT lower lobe measures 7 mm (image 11/5) which is new from comparison exam. Airways are normal. Limited view of the mediastinum demonstrates no adenopathy. Esophagus normal. Limited  view of the upper abdomen unremarkable. Limited view of the skeleton and chest wall is unremarkable. IMPRESSION: Pleural nodule in the LEFT lower lobe. Typically nodules in this location are benign; however, lesion new from CT 12/06/2008. Non-contrast chest CT at 6-12 months is recommended. If the nodule is stable at time of repeat CT, then future CT at 18-24 months (from today's scan) is considered optional for low-risk patients, but is recommended for high-risk patients. This recommendation follows the consensus statement: Guidelines for Management of Incidental Pulmonary Nodules Detected on CT Images: From the Fleischner Society 2017; Radiology 2017; 284:228-243. These results will be called to the ordering clinician or representative by the Radiologist Assistant, and communication documented in the PACS or zVision Dashboard. Electronically Signed: By: Suzy Bouchard M.D. On: 11/02/2018 13:54    Assessment & Plan:   There are no diagnoses linked to this encounter.   No orders of the defined types were placed in this encounter.    Follow-up: No follow-ups on file.  Walker Kehr, MD

## 2019-09-21 NOTE — Assessment & Plan Note (Signed)
Chronic, mild Neurol ref offered

## 2019-09-21 NOTE — Assessment & Plan Note (Signed)
CFS Reduce carbs

## 2019-09-21 NOTE — Assessment & Plan Note (Signed)
Metformin Labs 

## 2019-09-21 NOTE — Assessment & Plan Note (Signed)
BMET 

## 2019-09-25 ENCOUNTER — Other Ambulatory Visit: Payer: Self-pay | Admitting: Internal Medicine

## 2019-10-26 ENCOUNTER — Telehealth: Payer: Self-pay | Admitting: Internal Medicine

## 2019-10-26 NOTE — Telephone Encounter (Signed)
If provider wished for pt to be on statin, pt would be on statin.

## 2019-10-26 NOTE — Telephone Encounter (Signed)
Claudia [name] from [facility] called and stated that several faxes have been sent for statin therapy recommendations and she wanted to see if they were received and if so please fax back

## 2019-10-29 ENCOUNTER — Encounter: Payer: Self-pay | Admitting: Internal Medicine

## 2019-10-29 ENCOUNTER — Other Ambulatory Visit: Payer: Self-pay

## 2019-10-29 ENCOUNTER — Telehealth: Payer: Self-pay | Admitting: Internal Medicine

## 2019-10-29 ENCOUNTER — Ambulatory Visit (INDEPENDENT_AMBULATORY_CARE_PROVIDER_SITE_OTHER): Payer: Medicare HMO | Admitting: Internal Medicine

## 2019-10-29 ENCOUNTER — Ambulatory Visit: Payer: Self-pay

## 2019-10-29 ENCOUNTER — Ambulatory Visit (INDEPENDENT_AMBULATORY_CARE_PROVIDER_SITE_OTHER)
Admission: RE | Admit: 2019-10-29 | Discharge: 2019-10-29 | Disposition: A | Discharge status: Home / Self Care | Payer: Medicare HMO | Source: Ambulatory Visit | Attending: Internal Medicine | Admitting: Internal Medicine

## 2019-10-29 VITALS — BP 124/82 | HR 87 | Temp 98.4°F | Ht 65.0 in | Wt 183.0 lb

## 2019-10-29 DIAGNOSIS — S62012A Displaced fracture of distal pole of navicular [scaphoid] bone of left wrist, initial encounter for closed fracture: Secondary | ICD-10-CM | POA: Diagnosis not present

## 2019-10-29 DIAGNOSIS — M25511 Pain in right shoulder: Secondary | ICD-10-CM

## 2019-10-29 DIAGNOSIS — R410 Disorientation, unspecified: Secondary | ICD-10-CM | POA: Diagnosis not present

## 2019-10-29 DIAGNOSIS — S0993XA Unspecified injury of face, initial encounter: Secondary | ICD-10-CM | POA: Diagnosis not present

## 2019-10-29 DIAGNOSIS — M25532 Pain in left wrist: Secondary | ICD-10-CM | POA: Diagnosis not present

## 2019-10-29 DIAGNOSIS — E1122 Type 2 diabetes mellitus with diabetic chronic kidney disease: Secondary | ICD-10-CM | POA: Diagnosis not present

## 2019-10-29 DIAGNOSIS — S62102A Fracture of unspecified carpal bone, left wrist, initial encounter for closed fracture: Secondary | ICD-10-CM

## 2019-10-29 DIAGNOSIS — S4991XA Unspecified injury of right shoulder and upper arm, initial encounter: Secondary | ICD-10-CM | POA: Diagnosis not present

## 2019-10-29 DIAGNOSIS — S0990XA Unspecified injury of head, initial encounter: Secondary | ICD-10-CM | POA: Diagnosis not present

## 2019-10-29 NOTE — Telephone Encounter (Signed)
Dr Quay Burow, See results below.

## 2019-10-29 NOTE — Assessment & Plan Note (Signed)
Fall last night and she hit her right cheek-small abrasion present Questionable LOC last night-she does not recall Doubt fracture to facial bones-pain is mild We will hold off on x-ray of facial bones

## 2019-10-29 NOTE — Telephone Encounter (Signed)
Pt informed of below.  

## 2019-10-29 NOTE — Assessment & Plan Note (Signed)
Left wrist pain, swelling and decreased range of motion since fall last night X-ray today to rule out fracture

## 2019-10-29 NOTE — Telephone Encounter (Signed)
  Pt. Reports she was walking the dog yesterday and fell. She tried to break the fall and hit her right cheek. Hurt her right shoulder and left hand. Left hand is swollen, bruised and is painful. Her cheek "just has a scrape on it." Denies any headache. Requests an office visit. Warm transfer to [facility] in the practice for a visit. Reason for Disposition . [1] Age over 51 years AND [2] swelling or bruise  Answer Assessment - Initial Assessment Questions 1. MECHANISM: "How did the injury happen?" For falls, ask: "What height did you fall from?" and "What surface did you fall against?"      Fell 2. ONSET: "When did the injury happen?" (Minutes or hours ago)      Yesterday 3. NEUROLOGIC SYMPTOMS: "Was there any loss of consciousness?" "Are there any other neurological symptoms?"      No 4. MENTAL STATUS: "Does the person know who he is, who you are, and where he is?"      Alert and oriented 5. LOCATION: "What part of the head was hit?"      Cheek - right side 6. SCALP APPEARANCE: "What does the scalp look like? Is it bleeding now?" If so, ask: "Is it difficult to stop?"      Scrape to cheek 7. SIZE: For cuts, bruises, or swelling, ask: "How large is it?" (e.g., inches or centimeters)      Small 8. PAIN: "Is there any pain?" If so, ask: "How bad is it?"  (e.g., Scale 1-10; or mild, moderate, severe)     Right shoulder and left hand 9. TETANUS: For any breaks in the skin, ask: "When was the last tetanus booster?"     Unsure 10. OTHER SYMPTOMS: "Do you have any other symptoms?" (e.g., neck pain, vomiting)       No 11. PREGNANCY: "Is there any chance you are pregnant?" "When was your last menstrual period?"       No  Protocols used: HEAD INJURY-A-AH

## 2019-10-29 NOTE — Telephone Encounter (Signed)
'  Claudia [name]' with [facility] calling, states radiologist wanted to be sure ordering physician saw incidental findings of C3-C4 during HCT. Results are in [place]. Called practice, spoke with Gareth Eagle, will route for review.   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 However, the comparison study was obtained with the cervical spine in extension.  IMPRESSION: 1. 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 It was not seen on the scout view from the comparison head CT from May 15, 2017. However, the comparison film was obtained with the cervical spine in flexion. There is no obvious prevertebral soft tissue thickening on the scout view. Recommend clinical correlation. If there is concern for cervical spine injury, recommend dedicated imaging. 2. No acute intracranial abnormalities.

## 2019-10-29 NOTE — Telephone Encounter (Signed)
Please call her or her daughter.  She may be getting a CT of her head currently.  Xray of the left write show at least one fracture, maybe two - these are of the small bones of the wrist.    Xray of the shoulder shows possible small avulsion injury, but it may just be arthritis.  We need her to see orthopedics but will not be able to get her in until Monday.  She should treat her pain  And keep the areas immobile.  She should let us know if the pain is not controlled.     Urgent referral ordered for orthocare

## 2019-10-29 NOTE — Telephone Encounter (Signed)
Pt has OV today

## 2019-10-29 NOTE — Assessment & Plan Note (Signed)
History of right rotator cuff sprain still has some chronic pain and decreased range of motion, but pain and decreased movement much worse after fall last night X-ray today to rule out fracture, dislocation

## 2019-10-29 NOTE — Progress Notes (Signed)
Subjective:    Patient ID: Katherina Right, female    DOB: [birth year], 73 y.o.   MRN: [id number]  HPI The patient is here for an acute visit.  Her daughter is here with her.   She fell last night while walking her dog.  She is unsure why or how she fell.  She knew she was falling as she was falling.  She was not able to get up.  She is unsure if she had LOC.  She does have a history of falls, but typically her falls occur from tripping on something and she did not trip on anything.  Her daughter was concerned about the fall because earlier that day she was confused.  She felt like she was just off and even the patient felt she was off.  She was having difficulty formulating basic words.  She did forget her keys and stumbled at the grocery store when she was getting out of the car.  Some of this is not new for her because she does have worsening memory issues and history of falls, but that type of confusion was new.  When she fell she did hit her right cheek and she has an abrasion there.  She is not experiencing any new headaches.  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  She has not been experiencing any change in vision or nausea.  The balls of her feet hurt and are bruised since the fall.  She is unsure if she bent her toes or how that happened.   She has left wrist pain and swelling.  She has decreased range of motion of the wrist.  The wrist and hand is slightly swollen and she has weakness.  Right shoulder pain since the fall.  She is unable to lift the shoulder and has pain, especially in the posterior aspect of the shoulder.  She does have some chronic right shoulder pain and decreased range of motion weakness, but this is much worse than usual.  She denies any acute neck pain or lower back pain.  She denies any numbness or tingling.  Mild right anterior rib pain: She has some localized pain in her right anterior rib.  She  states it is mild.   Medications and allergies reviewed with patient and updated if appropriate.  Patient Active Problem List   Diagnosis Date Noted  . Ingrowing nail, left great toe 09/21/2019  . Memory loss 09/21/2019  . Occipital neuralgia of right side 06/17/2019  . Foot pain, bilateral 01/07/2019  . Contusion of hip 10/07/2018  . Right shoulder pain 10/07/2018  . CRI (chronic renal insufficiency), stage 3 (moderate) 07/01/2018  . Dog scratch 05/12/2018  . Sinusitis, chronic 12/02/2017  . Paresthesia 11/06/2017  . Skin rash 08/07/2017  . Headache 05/13/2017  . OAB (overactive bladder) 05/14/2016  . Concussion with loss of consciousness 01/05/2016  . Neoplasm of uncertain behavior of skin 01/05/2016  . Acute upper respiratory infection 09/12/2015  . Diabetes type 2, controlled ([place]) 09/12/2015  . 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 Diastolic dysfunction without heart failure 05/14/2015  . Preop exam for internal medicine 05/10/2015  . Hamstring tendonitis of right thigh 01/27/2015  . Forgetfulness 11/02/2014  . Pes planus of both feet 08/08/2014  . Avulsion fracture of lateral malleolus 06/07/2014  . Sprain of ankle, unspecified site 02/01/2014  . Tibialis posterior tendinitis 12/03/2013  . Ankle pain, right  09/14/2013  . Hyperglycemia 09/14/2013  . Leg pain 04/29/2012  . Knee pain 04/29/2012  . Well adult exam 10/23/2011  . 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 Weight gain 04/10/2011  . LIPOMA 10/17/2010  . HEMATURIA UNSPECIFIED 08/27/2010  . Abdominal pain 07/16/2010  . WEIGHT LOSS 05/09/2010  . FREQUENCY, URINARY 05/09/2010  . ABDOMINAL PAIN, EPIGASTRIC 05/09/2010  . VERTIGO 02/06/2010  . NECK PAIN 03/28/2009  . GALLSTONES 12/07/2008  . HEMORRHOIDS, INTERNAL 11/23/2008  . ESOPHAGEAL STRICTURE 11/23/2008  . HIATAL HERNIA 11/23/2008  . IBS 11/23/2008  . Upper respiratory infection 11/01/2008  . Chest pain, unspecified 11/01/2008  .  Pernicious anemia 08/16/2008  . Chronic fatigue 08/16/2008  . Anxiety state 11/25/2007  . Depression 11/25/2007  . Osteoarthritis 11/25/2007  . CRAMPS,LEG 11/25/2007  . BREAST CANCER, HX OF 11/25/2007  . RESTLESS LEG SYNDROME 07/23/2007  . Essential hypertension 07/23/2007  . Fibromyalgia 07/23/2007  . NEURALGIA 07/23/2007  . INSOMNIA 07/23/2007    Current Outpatient Medications on File Prior to Visit  Medication Sig Dispense Refill  . amLODipine (NORVASC) 2.5 MG tablet TAKE 1 TABLET EVERY DAY 90 tablet 3  . cholecalciferol (VITAMIN D) 1000 units tablet Take 1 tablet by mouth daily.    . cyanocobalamin (,VITAMIN B-12,) 1000 MCG/ML injection Inject 1 mL (1,000 mcg total) into the skin every 14 (fourteen) days. 10 mL 6  . DULoxetine (CYMBALTA) 60 MG capsule TAKE 1 CAPSULE EVERY DAY FOR DEPRESSION 90 capsule 3  . gabapentin (NEURONTIN) 600 MG tablet TAKE 1 TABLET THREE TIMES DAILY 270 tablet 3  . memantine (NAMENDA) 10 MG tablet Take 1 tablet (10 mg total) by mouth 2 (two) times daily. 60 tablet 11  . metFORMIN (GLUCOPHAGE) 500 MG tablet TAKE 1 TABLET EVERY DAY WITH BREAKFAST 90 tablet 3  . mirabegron ER (MYRBETRIQ) 50 MG TB24 tablet Take 1 tablet (50 mg total) by mouth daily. 30 tablet 11  . naproxen (NAPROSYN) 500 MG tablet Take 1 tablet (500 mg total) by mouth 2 (two) times daily with a meal. 30 tablet 0  . olmesartan (BENICAR) 20 MG tablet TAKE 1 TABLET EVERY DAY 90 tablet 3  . omeprazole (PRILOSEC) 40 MG capsule TAKE 1 CAPSULE TWICE DAILY 180 capsule 3  . SYRINGE-NEEDLE, DISP, 3 ML (BD ECLIPSE SYRINGE) 25G X 1" 3 ML MISC As directed SQ for B12 shots 50 each 3   Current Facility-Administered Medications on File Prior to Visit  Medication Dose Route Frequency Provider Last Rate Last Admin  . 0.9 %  sodium chloride infusion  500 mL Intravenous Continuous Milus Banister, MD        Past Medical History:  Diagnosis Date  . Acute upper respiratory infections of unspecified site   .  Anxiety state, unspecified   . Breast cancer ([place])   . Chest pain, unspecified   . Cramp of limb   . Depressive disorder, not elsewhere classified   . Diverticulitis    pt stated  . DM (diabetes mellitus) ([place])   . Headache(784.0)   . Hiatal hernia   . History of cold sores   . IBS (irritable bowel syndrome)   . Insomnia, unspecified   . Internal hemorrhoids without mention of complication   . Irritable bowel syndrome   . Myalgia and myositis, unspecified   . Neuralgia of chest    post op  . Neuralgia, neuritis, and radiculitis, unspecified   . Osteoarthritis   . Osteoarthrosis, unspecified whether generalized or localized, unspecified site   . Other  B-complex deficiencies   . Other malaise and fatigue   . Personal history of malignant neoplasm of breast   . Pneumonia    hx of  . Restless legs syndrome (RLS)   . Seizures ([place])    "mini seizures" 15 yrs. ago. Pt. states did have work up and was normal  . Stricture and stenosis of esophagus   . Unspecified essential hypertension     Past Surgical History:  Procedure Laterality Date  . ABDOMINAL HYSTERECTOMY    . breast reconstruction surgery bilateral - 8 yrs. ago    . CHOLECYSTECTOMY    . deviated septum surgery x 3    . hx. of bell's palsy at 14 yrs. old    . MASTECTOMY     bilateral total  . 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 rotator cuff surgery -left shoulder- 8 yrs. ago    . salva gland removed from left side of neck 60 yrs. ago    . TONSILLECTOMY    . TOTAL KNEE ARTHROPLASTY Left 06/01/2015   Procedure: LEFT TOTAL KNEE ARTHROPLASTY;  Surgeon: Rod Can, MD;  Location: [facility];  Service: Orthopedics;  Laterality: Left;    Social History   Socioeconomic History  . Marital status: Married    Spouse name: Not on file  . Number of children: Not on file  . Years of education: Not on file  . Highest education level: Not on file  Occupational History  . Occupation: Retired  Tobacco Use    . Smoking status: Former Smoker    Quit date: 11/26/1971    Years since quitting: 47.9  . Smokeless tobacco: Never Used  Substance and Sexual Activity  . Alcohol use: No  . Drug use: No  . Sexual activity: Yes  Other Topics Concern  . Not on file  Social History Narrative      [employer] - Retired again 2011      Regular exercise - YES, walking      Family history   B MG, CAD   Social Determinants of Health   Financial Resource Strain:   . Difficulty of Paying Living Expenses: Not on file  Food Insecurity:   . Worried About Charity fundraiser in the Last Year: Not on file  . Ran Out of Food in the Last Year: Not on file  Transportation Needs:   . Lack of Transportation (Medical): Not on file  . Lack of Transportation (Non-Medical): Not on file  Physical Activity:   . Days of Exercise per Week: Not on file  . Minutes of Exercise per Session: Not on file  Stress:   . Feeling of Stress : Not on file  Social Connections:   . Frequency of Communication with Friends and Family: Not on file  . Frequency of Social Gatherings with Friends and Family: Not on file  . Attends Religious Services: Not on file  . Active Member of Clubs or Organizations: Not on file  . Attends Archivist Meetings: Not on file  . Marital Status: Not on file    Family History  Problem Relation Age of Onset  . Diabetes Mother   . Kidney disease Mother   . Lymphoma Mother   . Diabetes Sister   . Heart disease Sister   . Stroke Sister   . Diabetes Brother   . Kidney disease Brother   . Coronary artery disease Brother   . Breast cancer Paternal Grandmother   .  Coronary artery disease Maternal Grandmother     Review of Systems  Eyes: Negative for visual disturbance.  Gastrointestinal: Negative for nausea.  Musculoskeletal: Positive for arthralgias. Negative for neck pain.  Neurological: Negative for dizziness, light-headedness and headaches.  Psychiatric/Behavioral: Positive for  confusion (yesterday.  some difficulty getting words out).       Objective:   Vitals:   10/29/19 1341  BP: 124/82  Pulse: 87  SpO2: 96%   BP Readings from Last 3 Encounters:  10/29/19 124/82  09/21/19 122/78  06/17/19 120/80   Wt Readings from Last 3 Encounters:  10/29/19 183 lb (83 kg)  09/21/19 180 lb (81.6 kg)  06/17/19 178 lb (80.7 kg)   Body mass index is 30.45 kg/m.   Physical Exam Constitutional:      General: She is not in acute distress.    Appearance: Normal appearance. She is not ill-appearing.  HENT:     Head: Normocephalic.     Comments: Mild abrasion right cheek without active discharge or bleeding, tenderness in that area Eyes:     Conjunctiva/sclera: Conjunctivae normal.  Pulmonary:     Effort: Pulmonary effort is normal.  Musculoskeletal:     Comments: Left wrist swelling and mild bruising that extends into posterior hand, tenderness anterior aspect of wrist, decreased range of motion.  Normal sensation, and strength decreased  Prominent right shoulder, tenderness with palpation, especially posterior shoulder, significantly decreased range of motion that causes pain.  Normal sensation in the right upper extremity.  Strength in right hand normal.  No tenderness with palpation cervical spine  Tenderness right anterior lower rib-localized and mild.  No obvious deformity  Skin:    General: Skin is warm and dry.  Neurological:     Mental Status: She is alert.     Sensory: No sensory deficit.  Psychiatric:     Comments: Behavior and mood are normal.  There is some obvious short-term memory issues            Assessment & Plan:    See Problem List for Assessment and Plan of chronic medical problems.

## 2019-10-29 NOTE — Patient Instructions (Addendum)
Have X-rays today.     Tylenol as needed for pain - up to  3000 mg a day.   If pain is not controlled in addition to Tylenol you can take Aleve or Advil but limit this to only if needed and for a short duration.     A CT scan was ordered.   [place]

## 2019-10-29 NOTE — Assessment & Plan Note (Signed)
Increased confusion yesterday and today-difficulty formulating daily words and just feeling out of it Possibly related to increased stress with her husband coming home from the hospital recently Her daughter was concerned about the possibility of stroke Fall last night and she does have an abrasion on her right cheek, questionable LOC We will go ahead and get a CT scan, but at I think there is low suspicion for an acute CVA and acute brain bleed

## 2019-11-01 NOTE — Telephone Encounter (Signed)
No concern for cervical spine injury with this fall.

## 2019-11-02 ENCOUNTER — Encounter: Payer: Self-pay | Admitting: Orthopaedic Surgery

## 2019-11-02 ENCOUNTER — Ambulatory Visit (INDEPENDENT_AMBULATORY_CARE_PROVIDER_SITE_OTHER): Payer: Medicare HMO | Admitting: Orthopaedic Surgery

## 2019-11-02 ENCOUNTER — Other Ambulatory Visit: Payer: Self-pay

## 2019-11-02 DIAGNOSIS — S62025A Nondisplaced fracture of middle third of navicular [scaphoid] bone of left wrist, initial encounter for closed fracture: Secondary | ICD-10-CM

## 2019-11-02 DIAGNOSIS — S62009A Unspecified fracture of navicular [scaphoid] bone of unspecified wrist, initial encounter for closed fracture: Secondary | ICD-10-CM | POA: Insufficient documentation

## 2019-11-02 DIAGNOSIS — S90129A Contusion of unspecified lesser toe(s) without damage to nail, initial encounter: Secondary | ICD-10-CM | POA: Diagnosis not present

## 2019-11-02 NOTE — Progress Notes (Addendum)
Office Visit Note   Patient: Claudia [name]           Date of Birth: Mar 13, 1946           MRN: [id number] Visit Date: 11/02/2019              Requested by: Binnie Rail, MD [place],  [place] [address] PCP: Plotnikov, Evie Lacks, MD   Assessment & Plan: Visit Diagnoses:  1. Closed nondisplaced fracture of middle third of scaphoid bone of left wrist, initial encounter   2. Contusion of toe without damage to nail, unspecified laterality, unspecified toe, initial encounter           bilateral  Plan: Short arm thumb spica cast applied for left scaphoid fracture.  Return 5 weeks with cast check.  If the cast is worn we will proceed with cast removal and x-rays.  If the cast is in still good shape and not loose we can x-ray her in the cast.  Follow-Up Instructions: Return in about 5 weeks (around 12/07/2019).   Orders:  No orders of the defined types were placed in this encounter.  No orders of the defined types were placed in this encounter.     Procedures: No procedures performed   Clinical Data: No additional findings.   Subjective: Chief Complaint  Patient presents with  . Right Shoulder - Pain    Fall 10/28/2019  . Left Wrist - Pain    Fall 10/28/2019    HPI 73 year old female [place]  She fell forward landing on her face with sharp pain in her left wrist and bruising.  She states she jammed her shoulder as well.  She has had pain with gripping moving her thumb.  She is seen in the emergency room where x-rays were obtained which demonstrated nondisplaced fracture of the waist of the scaphoid, left.  Right shoulder x-rays were negative for acute fracture of the proximal humerus but there was.  Tiny chip avulsion inferior glenoid.  She can get her arm up overhead easily without subluxation anteriorly.Marland Kitchen  Head CT 10/29/2019 - for acute changes.  Review of Systems new systems history positive for chronic fatigue history of  breast cancer abdominal pain ankle sprains.  Avascular necrosis of femoral condyle on the left.  Positive for forgetfulness she is on Namenda.  Positive for diabetes.   Objective: Vital Signs: BP 140/86   Pulse 74   Ht 5' 5.5" (1.664 m)   Wt 180 lb (81.6 kg)   BMI 29.50 kg/m   Physical Exam Constitutional:      Appearance: She is well-developed.  HENT:     Head: Normocephalic.     Right Ear: External ear normal.     Left Ear: External ear normal.  Eyes:     Pupils: Pupils are equal, round, and reactive to light.  Neck:     Thyroid: No thyromegaly.     Trachea: No tracheal deviation.  Cardiovascular:     Rate and Rhythm: Normal rate.  Pulmonary:     Effort: Pulmonary effort is normal.  Abdominal:     Palpations: Abdomen is soft.  Skin:    General: Skin is warm and dry.  Neurological:     Mental Status: She is alert and oriented to person, place, and time.  Psychiatric:        Behavior: Behavior normal.     Ortho Exam patient can get her right upper  arm over her head with mild discomfort.  No distal migration of the biceps muscle elbow has full range of motion station the hand is intact good cervical range of motion.  Patient has exquisite tenderness over the left snuffbox and tuberosity of the scaphoid.  Section of the hand is intact.  Good elbow range of motion.  Specialty Comments:  No specialty comments available.  Imaging: CLINICAL DATA:  Fall last night swelling and pain decreased range of motion.  EXAM: LEFT WRIST - COMPLETE 3+ VIEW  COMPARISON:  None  FINDINGS: Minimally displaced fracture of the distal pole the scaphoid, along the lateral margin. Suggestion correlate along the medial aspect of the scaphoid as well, degenerative changes at the first carpometacarpal and second carpometacarpal joints as well as with respect to scaphoid and trapezium as well as scaphoid and trapezoid articulation. Concomitant fractures of the distal carpal row  be difficult to exclude as there is surrounding degenerative change. There may be a nondisplaced fracture of the trapezium best seen on the oblique view.  Distal radius is intact. The distal radioulnar joint is normal.  IMPRESSION: 1. Minimally displaced fracture of the distal pole of the scaphoid. 2. Suggestion of nondisplaced fracture of the trapezium. 3. Degenerative changes at the first carpometacarpal and second carpometacarpal joints as well as at the second carpometacarpal articulation. 4. Osteopenia and concomitant degenerative changes limit assessment for additional fractures.   Electronically Signed   By: Zetta Bills M.D.   On: 10/29/2019 15:31   PMFS History: Patient Active Problem List   Diagnosis Date Noted  . Scaphoid fracture, wrist, closed 11/02/2019  . Toe contusion 11/02/2019  . Facial trauma, initial encounter 10/29/2019  . Confusion 10/29/2019  . Left wrist pain 10/29/2019  . Ingrowing nail, left great toe 09/21/2019  . Memory loss 09/21/2019  . Occipital neuralgia of right side 06/17/2019  . Foot pain, bilateral 01/07/2019  . Contusion of hip 10/07/2018  . Right shoulder pain 10/07/2018  . CRI (chronic renal insufficiency), stage 3 (moderate) 07/01/2018  . Dog scratch 05/12/2018  . Sinusitis, chronic 12/02/2017  . Paresthesia 11/06/2017  . Skin rash 08/07/2017  . Headache 05/13/2017  . OAB (overactive bladder) 05/14/2016  . Concussion with loss of consciousness 01/05/2016  . Neoplasm of uncertain behavior of skin 01/05/2016  . Acute upper respiratory infection 09/12/2015  . Diabetes type 2, controlled ([place]) 09/12/2015  . 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 Diastolic dysfunction without heart failure 05/14/2015  . Preop exam for internal medicine 05/10/2015  . Hamstring tendonitis of right thigh 01/27/2015  . Forgetfulness 11/02/2014  . Pes planus of both feet 08/08/2014  . Avulsion fracture of lateral  malleolus 06/07/2014  . Sprain of ankle, unspecified site 02/01/2014  . Tibialis posterior tendinitis 12/03/2013  . Ankle pain, right 09/14/2013  . Hyperglycemia 09/14/2013  . Leg pain 04/29/2012  . Knee pain 04/29/2012  . Well adult exam 10/23/2011  . 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 Weight gain 04/10/2011  . LIPOMA 10/17/2010  . HEMATURIA UNSPECIFIED 08/27/2010  . Abdominal pain 07/16/2010  . WEIGHT LOSS 05/09/2010  . FREQUENCY, URINARY 05/09/2010  . ABDOMINAL PAIN, EPIGASTRIC 05/09/2010  . VERTIGO 02/06/2010  . NECK PAIN 03/28/2009  . GALLSTONES 12/07/2008  . HEMORRHOIDS, INTERNAL 11/23/2008  . ESOPHAGEAL STRICTURE 11/23/2008  . HIATAL HERNIA 11/23/2008  . IBS 11/23/2008  . Upper respiratory infection 11/01/2008  . Chest pain, unspecified 11/01/2008  . Pernicious anemia 08/16/2008  . Chronic fatigue 08/16/2008  .  Anxiety state 11/25/2007  . Depression 11/25/2007  . Osteoarthritis 11/25/2007  . CRAMPS,LEG 11/25/2007  . BREAST CANCER, HX OF 11/25/2007  . RESTLESS LEG SYNDROME 07/23/2007  . Essential hypertension 07/23/2007  . Fibromyalgia 07/23/2007  . NEURALGIA 07/23/2007  . INSOMNIA 07/23/2007   Past Medical History:  Diagnosis Date  . Acute upper respiratory infections of unspecified site   . Anxiety state, unspecified   . Breast cancer ([place])   . Chest pain, unspecified   . Cramp of limb   . Depressive disorder, not elsewhere classified   . Diverticulitis    pt stated  . DM (diabetes mellitus) ([place])   . Headache(784.0)   . Hiatal hernia   . History of cold sores   . IBS (irritable bowel syndrome)   . Insomnia, unspecified   . Internal hemorrhoids without mention of complication   . Irritable bowel syndrome   . Myalgia and myositis, unspecified   . Neuralgia of chest    post op  . Neuralgia, neuritis, and radiculitis, unspecified   . Osteoarthritis   . Osteoarthrosis, unspecified whether generalized or localized, unspecified site   . Other B-complex  deficiencies   . Other malaise and fatigue   . Personal history of malignant neoplasm of breast   . Pneumonia    hx of  . Restless legs syndrome (RLS)   . Seizures ([place])    "mini seizures" 15 yrs. ago. Pt. states did have work up and was normal  . Stricture and stenosis of esophagus   . Unspecified essential hypertension     Family History  Problem Relation Age of Onset  . Diabetes Mother   . Kidney disease Mother   . Lymphoma Mother   . Diabetes Sister   . Heart disease Sister   . Stroke Sister   . Diabetes Brother   . Kidney disease Brother   . Coronary artery disease Brother   . Breast cancer Paternal Grandmother   . Coronary artery disease Maternal Grandmother     Past Surgical History:  Procedure Laterality Date  . ABDOMINAL HYSTERECTOMY    . breast reconstruction surgery bilateral - 8 yrs. ago    . CHOLECYSTECTOMY    . deviated septum surgery x 3    . hx. of bell's palsy at 14 yrs. old    . MASTECTOMY     bilateral total  . 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 rotator cuff surgery -left shoulder- 8 yrs. ago    . salva gland removed from left side of neck 60 yrs. ago    . TONSILLECTOMY    . TOTAL KNEE ARTHROPLASTY Left 06/01/2015   Procedure: LEFT TOTAL KNEE ARTHROPLASTY;  Surgeon: Rod Can, MD;  Location: [facility];  Service: Orthopedics;  Laterality: Left;   Social History   Occupational History  . Occupation: Retired  Tobacco Use  . Smoking status: Former Smoker    Quit date: 11/26/1971    Years since quitting: 47.9  . Smokeless tobacco: Never Used  Substance and Sexual Activity  . Alcohol use: No  . Drug use: No  . Sexual activity: Yes

## 2019-11-11 ENCOUNTER — Other Ambulatory Visit: Payer: Self-pay | Admitting: Internal Medicine

## 2019-12-07 ENCOUNTER — Ambulatory Visit (INDEPENDENT_AMBULATORY_CARE_PROVIDER_SITE_OTHER): Payer: Medicare HMO | Admitting: Orthopaedic Surgery

## 2019-12-07 ENCOUNTER — Ambulatory Visit: Payer: Self-pay

## 2019-12-07 ENCOUNTER — Encounter: Payer: Self-pay | Admitting: Orthopaedic Surgery

## 2019-12-07 ENCOUNTER — Ambulatory Visit: Payer: Medicare HMO | Admitting: Orthopaedic Surgery

## 2019-12-07 ENCOUNTER — Other Ambulatory Visit: Payer: Self-pay

## 2019-12-07 VITALS — Ht 65.5 in | Wt 180.0 lb

## 2019-12-07 DIAGNOSIS — S62025A Nondisplaced fracture of middle third of navicular [scaphoid] bone of left wrist, initial encounter for closed fracture: Secondary | ICD-10-CM

## 2019-12-07 NOTE — Progress Notes (Signed)
Office Visit Note   Patient: Claudia [name]           Date of Birth: [birth year]           MRN: [id number] Visit Date: 12/07/2019              Requested by: Cassandria Anger, MD [place],  [place] [address] PCP: Plotnikov, Evie Lacks, MD   Assessment & Plan: Visit Diagnoses:  1. Closed nondisplaced fracture of middle third of scaphoid bone of left wrist, initial encounter     Plan: Cast is trimmed back return in 3 weeks for recheck.  Cast off repeat x-rays and then likely wrist splint with abducted thumb application.   Follow-Up Instructions: No follow-ups on file.   Orders:  Orders Placed This Encounter  Procedures  . XR Wrist Complete Left   No orders of the defined types were placed in this encounter.     Procedures: No procedures performed   Clinical Data: No additional findings.   Subjective: Chief Complaint  Patient presents with  . Left Wrist - Fracture, Follow-up    HPI 74 year old female returns now 5 weeks post scaphoid fracture with fall.  Cast is a little bit long blocking some MP flexion and this is trimmed back today.  She has intact sensation in the fingertips.  She is noted problems with thumb spica cast grabbing mops doing household chores etc.  Review of Systems Reviewed updated unchanged from 11/02/2019 office visit.  Objective: Vital Signs: Ht 5' 5.5" (1.664 m)   Wt 180 lb (81.6 kg)   BMI 29.50 kg/m   Physical Exam Constitutional:      Appearance: She is well-developed.  HENT:     Head: Normocephalic.     Right Ear: External ear normal.     Left Ear: External ear normal.  Eyes:     Pupils: Pupils are equal, round, and reactive to light.  Neck:     Thyroid: No thyromegaly.     Trachea: No tracheal deviation.  Cardiovascular:     Rate and Rhythm: Normal rate.  Pulmonary:     Effort: Pulmonary effort is normal.  Abdominal:     Palpations: Abdomen is soft.  Skin:    General: Skin is warm and dry.    Neurological:     Mental Status: She is alert and oriented to person, place, and time.  Psychiatric:        Behavior: Behavior normal.     Ortho Exam thumb spica cast slightly long blocking some MP flexion.  Specialty Comments:  No specialty comments available.  Imaging: XR Wrist Complete Left  Result Date: 12/07/2019 Three-view x-rays left wrist obtained and reviewed.  This shows a waist the scaphoid fracture with sclerosis consistent with some healing.  No displacement compared to previous 11/02/2019 images. Impression: Right scaphoid fracture with some sclerotic healing.    PMFS History: Patient Active Problem List   Diagnosis Date Noted  . Scaphoid fracture, wrist, closed 11/02/2019  . Toe contusion 11/02/2019  . Facial trauma, initial encounter 10/29/2019  . Confusion 10/29/2019  . Left wrist pain 10/29/2019  . Ingrowing nail, left great toe 09/21/2019  . Memory loss 09/21/2019  . Occipital neuralgia of right side 06/17/2019  . Foot pain, bilateral 01/07/2019  . Contusion of hip 10/07/2018  . Right shoulder pain 10/07/2018  . CRI (chronic renal insufficiency), stage 3 (moderate) 07/01/2018  . Dog scratch 05/12/2018  . Sinusitis, chronic 12/02/2017  . Paresthesia  11/06/2017  . Skin rash 08/07/2017  . Headache 05/13/2017  . OAB (overactive bladder) 05/14/2016  . Concussion with loss of consciousness 01/05/2016  . Neoplasm of uncertain behavior of skin 01/05/2016  . Acute upper respiratory infection 09/12/2015  . Diabetes type 2, controlled ([place]) 09/12/2015  . 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 ([place]) 06/01/2015  . Diastolic dysfunction without heart failure 05/14/2015  . Preop exam for internal medicine 05/10/2015  . Hamstring tendonitis of right thigh 01/27/2015  . Forgetfulness 11/02/2014  . Pes planus of both feet 08/08/2014  . Avulsion fracture of lateral malleolus 06/07/2014  . Sprain of ankle, unspecified site 02/01/2014  . Tibialis  posterior tendinitis 12/03/2013  . Ankle pain, right 09/14/2013  . Hyperglycemia 09/14/2013  . Leg pain 04/29/2012  . Knee pain 04/29/2012  . Well adult exam 10/23/2011  . 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 Weight gain 04/10/2011  . LIPOMA 10/17/2010  . HEMATURIA UNSPECIFIED 08/27/2010  . Abdominal pain 07/16/2010  . WEIGHT LOSS 05/09/2010  . FREQUENCY, URINARY 05/09/2010  . ABDOMINAL PAIN, EPIGASTRIC 05/09/2010  . VERTIGO 02/06/2010  . NECK PAIN 03/28/2009  . GALLSTONES 12/07/2008  . HEMORRHOIDS, INTERNAL 11/23/2008  . ESOPHAGEAL STRICTURE 11/23/2008  . HIATAL HERNIA 11/23/2008  . IBS 11/23/2008  . Upper respiratory infection 11/01/2008  . Chest pain, unspecified 11/01/2008  . Pernicious anemia 08/16/2008  . Chronic fatigue 08/16/2008  . Anxiety state 11/25/2007  . Depression 11/25/2007  . Osteoarthritis 11/25/2007  . CRAMPS,LEG 11/25/2007  . BREAST CANCER, HX OF 11/25/2007  . RESTLESS LEG SYNDROME 07/23/2007  . Essential hypertension 07/23/2007  . Fibromyalgia 07/23/2007  . NEURALGIA 07/23/2007  . INSOMNIA 07/23/2007   Past Medical History:  Diagnosis Date  . Acute upper respiratory infections of unspecified site   . Anxiety state, unspecified   . Breast cancer ([place])   . Chest pain, unspecified   . Cramp of limb   . Depressive disorder, not elsewhere classified   . Diverticulitis    pt stated  . DM (diabetes mellitus) ([place])   . Headache(784.0)   . Hiatal hernia   . History of cold sores   . IBS (irritable bowel syndrome)   . Insomnia, unspecified   . Internal hemorrhoids without mention of complication   . Irritable bowel syndrome   . Myalgia and myositis, unspecified   . Neuralgia of chest    post op  . Neuralgia, neuritis, and radiculitis, unspecified   . Osteoarthritis   . Osteoarthrosis, unspecified whether generalized or localized, unspecified site   . Other B-complex deficiencies   . Other malaise and fatigue   . Personal history of malignant neoplasm of  breast   . Pneumonia    hx of  . Restless legs syndrome (RLS)   . Seizures ([place])    "mini seizures" 15 yrs. ago. Pt. states did have work up and was normal  . Stricture and stenosis of esophagus   . Unspecified essential hypertension     Family History  Problem Relation Age of Onset  . Diabetes Mother   . Kidney disease Mother   . Lymphoma Mother   . Diabetes Sister   . Heart disease Sister   . Stroke Sister   . Diabetes Brother   . Kidney disease Brother   . Coronary artery disease Brother   . Breast cancer Paternal Grandmother   . Coronary artery disease Maternal Grandmother     Past Surgical History:  Procedure Laterality Date  . ABDOMINAL HYSTERECTOMY    .  breast reconstruction surgery bilateral - 8 yrs. ago    . CHOLECYSTECTOMY    . deviated septum surgery x 3    . hx. of bell's palsy at 14 yrs. old    . MASTECTOMY     bilateral total  . 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 rotator cuff surgery -left shoulder- 8 yrs. ago    . salva gland removed from left side of neck 60 yrs. ago    . TONSILLECTOMY    . TOTAL KNEE ARTHROPLASTY Left 06/01/2015   Procedure: LEFT TOTAL KNEE ARTHROPLASTY;  Surgeon: Rod Can, MD;  Location: [facility];  Service: Orthopedics;  Laterality: Left;   Social History   Occupational History  . Occupation: Retired  Tobacco Use  . Smoking status: Former Smoker    Quit date: 11/26/1971    Years since quitting: 48.0  . Smokeless tobacco: Never Used  Substance and Sexual Activity  . Alcohol use: No  . Drug use: No  . Sexual activity: Yes

## 2019-12-22 ENCOUNTER — Encounter: Payer: Self-pay | Admitting: Internal Medicine

## 2019-12-22 ENCOUNTER — Other Ambulatory Visit: Payer: Self-pay

## 2019-12-22 ENCOUNTER — Ambulatory Visit (INDEPENDENT_AMBULATORY_CARE_PROVIDER_SITE_OTHER): Payer: Medicare HMO | Admitting: Internal Medicine

## 2019-12-22 DIAGNOSIS — N183 Chronic kidney disease, stage 3 unspecified: Secondary | ICD-10-CM

## 2019-12-22 DIAGNOSIS — M79672 Pain in left foot: Secondary | ICD-10-CM

## 2019-12-22 DIAGNOSIS — I10 Essential (primary) hypertension: Secondary | ICD-10-CM | POA: Diagnosis not present

## 2019-12-22 DIAGNOSIS — N2889 Other specified disorders of kidney and ureter: Secondary | ICD-10-CM

## 2019-12-22 DIAGNOSIS — S62102D Fracture of unspecified carpal bone, left wrist, subsequent encounter for fracture with routine healing: Secondary | ICD-10-CM | POA: Diagnosis not present

## 2019-12-22 DIAGNOSIS — D510 Vitamin B12 deficiency anemia due to intrinsic factor deficiency: Secondary | ICD-10-CM

## 2019-12-22 DIAGNOSIS — E1122 Type 2 diabetes mellitus with diabetic chronic kidney disease: Secondary | ICD-10-CM | POA: Diagnosis not present

## 2019-12-22 DIAGNOSIS — M8589 Other specified disorders of bone density and structure, multiple sites: Secondary | ICD-10-CM

## 2019-12-22 DIAGNOSIS — M79671 Pain in right foot: Secondary | ICD-10-CM

## 2019-12-22 DIAGNOSIS — M797 Fibromyalgia: Secondary | ICD-10-CM

## 2019-12-22 DIAGNOSIS — G2581 Restless legs syndrome: Secondary | ICD-10-CM

## 2019-12-22 DIAGNOSIS — E119 Type 2 diabetes mellitus without complications: Secondary | ICD-10-CM

## 2019-12-22 DIAGNOSIS — M8580 Other specified disorders of bone density and structure, unspecified site: Secondary | ICD-10-CM | POA: Insufficient documentation

## 2019-12-22 DIAGNOSIS — S62109A Fracture of unspecified carpal bone, unspecified wrist, initial encounter for closed fracture: Secondary | ICD-10-CM | POA: Insufficient documentation

## 2019-12-22 NOTE — Assessment & Plan Note (Signed)
Gabapentin po 

## 2019-12-22 NOTE — Assessment & Plan Note (Signed)
On Metformin 

## 2019-12-22 NOTE — Assessment & Plan Note (Signed)
Gabapentin

## 2019-12-22 NOTE — Assessment & Plan Note (Signed)
On B12 

## 2019-12-22 NOTE — Patient Instructions (Addendum)
Denosumab injection What is this medicine? DENOSUMAB (den oh sue mab) slows bone breakdown. Prolia is used to treat osteoporosis in women after menopause and in men, and in people who are taking corticosteroids for 6 months or more. Xgeva is used to treat a high calcium level due to cancer and to prevent bone fractures and other bone problems caused by multiple myeloma or cancer bone metastases. Xgeva is also used to treat giant cell tumor of the bone. This medicine may be used for other purposes; ask your health care provider or pharmacist if you have questions. COMMON BRAND NAME(S): Prolia, XGEVA What should I tell my health care provider before I take this medicine? They need to know if you have any of these conditions:  dental disease  having surgery or tooth extraction  infection  kidney disease  low levels of calcium or Vitamin D in the blood  malnutrition  on hemodialysis  skin conditions or sensitivity  thyroid or parathyroid disease  an unusual reaction to denosumab, other medicines, foods, dyes, or preservatives  pregnant or trying to get pregnant  breast-feeding How should I use this medicine? This medicine is for injection under the skin. It is given by a health care professional in a hospital or clinic setting. A special MedGuide will be given to you before each treatment. Be sure to read this information carefully each time. For Prolia, talk to your pediatrician regarding the use of this medicine in children. Special care may be needed. For Xgeva, talk to your pediatrician regarding the use of this medicine in children. While this drug may be prescribed for children as young as 13 years for selected conditions, precautions do apply. Overdosage: If you think you have taken too much of this medicine contact a poison control center or emergency room at once. NOTE: This medicine is only for you. Do not share this medicine with others. What if I miss a dose? It is  important not to miss your dose. Call your doctor or health care professional if you are unable to keep an appointment. What may interact with this medicine? Do not take this medicine with any of the following medications:  other medicines containing denosumab This medicine may also interact with the following medications:  medicines that lower your chance of fighting infection  steroid medicines like prednisone or cortisone This list may not describe all possible interactions. Give your health care provider a list of all the medicines, herbs, non-prescription drugs, or dietary supplements you use. Also tell them if you smoke, drink alcohol, or use illegal drugs. Some items may interact with your medicine. What should I watch for while using this medicine? Visit your doctor or health care professional for regular checks on your progress. Your doctor or health care professional may order blood tests and other tests to see how you are doing. Call your doctor or health care professional for advice if you get a fever, chills or sore throat, or other symptoms of a cold or flu. Do not treat yourself. This drug may decrease your body's ability to fight infection. Try to avoid being around people who are sick. You should make sure you get enough calcium and vitamin D while you are taking this medicine, unless your doctor tells you not to. Discuss the foods you eat and the vitamins you take with your health care professional. See your dentist regularly. Brush and floss your teeth as directed. Before you have any dental work done, tell your dentist you are   receiving this medicine. Do not become pregnant while taking this medicine or for 5 months after stopping it. Talk with your doctor or health care professional about your birth control options while taking this medicine. Women should inform their doctor if they wish to become pregnant or think they might be pregnant. There is a potential for serious side  effects to an unborn child. Talk to your health care professional or pharmacist for more information. What side effects may I notice from receiving this medicine? Side effects that you should report to your doctor or health care professional as soon as possible:  allergic reactions like skin rash, itching or hives, swelling of the face, lips, or tongue  bone pain  breathing problems  dizziness  jaw pain, especially after dental work  redness, blistering, peeling of the skin  signs and symptoms of infection like fever or chills; cough; sore throat; pain or trouble passing urine  signs of low calcium like fast heartbeat, muscle cramps or muscle pain; pain, tingling, numbness in the hands or feet; seizures  unusual bleeding or bruising  unusually weak or tired Side effects that usually do not require medical attention (report to your doctor or health care professional if they continue or are bothersome):  constipation  diarrhea  headache  joint pain  loss of appetite  muscle pain  runny nose  tiredness  upset stomach This list may not describe all possible side effects. Call your doctor for medical advice about side effects. You may report side effects to FDA at 1-800-FDA-1088. Where should I keep my medicine? This medicine is only given in a clinic, doctor's office, or other health care setting and will not be stored at home. NOTE: This sheet is a summary. It may not cover all possible information. If you have questions about this medicine, talk to your doctor, pharmacist, or health care provider.  2020 Elsevier/Gold Standard (2018-03-13 16:10:44)    ?trekking poles for walking

## 2019-12-22 NOTE — Assessment & Plan Note (Signed)
?  trekking poles for walking 

## 2019-12-22 NOTE — Assessment & Plan Note (Signed)
Norvasc, Olmesartan

## 2019-12-22 NOTE — Progress Notes (Signed)
Subjective:  Patient ID: Claudia [name], female    DOB: 04/27/1946  Age: 74 y.o. MRN: [id number]  CC: No chief complaint on file.   HPI Claudia [name] presents for HTN, FMS, OA, GERD f/u S/p multiple fx after a fall - 10/29/19 - L wrist, ?L toes  Outpatient Medications Prior to Visit  Medication Sig Dispense Refill  . amLODipine (NORVASC) 2.5 MG tablet TAKE 1 TABLET EVERY DAY 90 tablet 3  . cholecalciferol (VITAMIN D) 1000 units tablet Take 1 tablet by mouth daily.    . cyanocobalamin (,VITAMIN B-12,) 1000 MCG/ML injection Inject 1 mL (1,000 mcg total) into the skin every 14 (fourteen) days. 10 mL 6  . DULoxetine (CYMBALTA) 60 MG capsule TAKE 1 CAPSULE EVERY DAY FOR DEPRESSION 90 capsule 3  . gabapentin (NEURONTIN) 600 MG tablet TAKE 1 TABLET THREE TIMES DAILY 270 tablet 1  . memantine (NAMENDA) 10 MG tablet Take 1 tablet (10 mg total) by mouth 2 (two) times daily. 60 tablet 11  . metFORMIN (GLUCOPHAGE) 500 MG tablet TAKE 1 TABLET EVERY DAY WITH BREAKFAST 90 tablet 3  . mirabegron ER (MYRBETRIQ) 50 MG TB24 tablet Take 1 tablet (50 mg total) by mouth daily. 30 tablet 11  . olmesartan (BENICAR) 20 MG tablet TAKE 1 TABLET EVERY DAY 90 tablet 3  . omeprazole (PRILOSEC) 40 MG capsule TAKE 1 CAPSULE TWICE DAILY 180 capsule 3  . SYRINGE-NEEDLE, DISP, 3 ML (BD ECLIPSE SYRINGE) 25G X 1" 3 ML MISC As directed SQ for B12 shots 50 each 3   No facility-administered medications prior to visit.    ROS: Review of Systems  Constitutional: Positive for fatigue. Negative for activity change, appetite change, chills, diaphoresis, fever and unexpected weight change.  HENT: Negative for congestion, dental problem, ear pain, hearing loss, mouth sores, postnasal drip, sinus pressure, sneezing, sore throat and voice change.   Eyes: Negative for pain and visual disturbance.  Respiratory: Negative for cough, chest tightness, wheezing and stridor.   Cardiovascular: Negative for chest pain, palpitations and  leg swelling.  Gastrointestinal: Negative for abdominal distention, abdominal pain, blood in stool, nausea, rectal pain and vomiting.  Genitourinary: Negative for decreased urine volume, difficulty urinating, dysuria, frequency, hematuria, menstrual problem, vaginal bleeding, vaginal discharge and vaginal pain.  Musculoskeletal: Positive for arthralgias, back pain, gait problem, myalgias and neck stiffness. Negative for joint swelling and neck pain.  Skin: Negative for color change, rash and wound.  Neurological: Positive for weakness. Negative for dizziness, tremors, syncope, speech difficulty and light-headedness.  Hematological: Negative for adenopathy.  Psychiatric/Behavioral: Positive for decreased concentration and sleep disturbance. Negative for behavioral problems, confusion, dysphoric mood, hallucinations and suicidal ideas. The patient is nervous/anxious. The patient is not hyperactive.     Objective:  BP (!) 144/86 (BP Location: Right Arm, Patient Position: Sitting, Cuff Size: Normal)   Pulse 82   Temp 98.2 F (36.8 C) (Oral)   Ht 5' 5.5" (1.664 m)   Wt 179 lb (81.2 kg)   SpO2 96%   BMI 29.33 kg/m   BP Readings from Last 3 Encounters:  12/22/19 (!) 144/86  11/02/19 140/86  10/29/19 124/82    Wt Readings from Last 3 Encounters:  12/22/19 179 lb (81.2 kg)  12/07/19 180 lb (81.6 kg)  11/02/19 180 lb (81.6 kg)    Physical Exam Constitutional:      General: She is not in acute distress.    Appearance: She is well-developed. She is obese.  HENT:  Head: Normocephalic.     Right Ear: External ear normal.     Left Ear: External ear normal.     Nose: Nose normal.  Eyes:     General:        Right eye: No discharge.        Left eye: No discharge.     Conjunctiva/sclera: Conjunctivae normal.     Pupils: Pupils are equal, round, and reactive to light.  Neck:     Thyroid: No thyromegaly.     Vascular: No JVD.     Trachea: No tracheal deviation.  Cardiovascular:      Rate and Rhythm: Normal rate and regular rhythm.     Heart sounds: Normal heart sounds.  Pulmonary:     Effort: No respiratory distress.     Breath sounds: No stridor. No wheezing.  Abdominal:     General: Bowel sounds are normal. There is no distension.     Palpations: Abdomen is soft. There is no mass.     Tenderness: There is no abdominal tenderness. There is no guarding or rebound.  Musculoskeletal:        General: Tenderness present.     Cervical back: Normal range of motion and neck supple.  Lymphadenopathy:     Cervical: No cervical adenopathy.  Skin:    Findings: No erythema or rash.  Neurological:     Cranial Nerves: No cranial nerve deficit.     Motor: No abnormal muscle tone.     Coordination: Coordination abnormal.     Gait: Gait abnormal.     Deep Tendon Reflexes: Reflexes normal.  Psychiatric:        Behavior: Behavior normal.        Thought Content: Thought content normal.        Judgment: Judgment normal.     Lab Results  Component Value Date   WBC 5.2 09/21/2019   HGB 14.1 09/21/2019   HCT 42.3 09/21/2019   PLT 295.0 09/21/2019   GLUCOSE 125 (H) 09/21/2019   CHOL 208 (H) 06/17/2019   TRIG 95.0 06/17/2019   HDL 48.20 06/17/2019   LDLCALC 141 (H) 06/17/2019   ALT 13 06/17/2019   AST 20 06/17/2019   NA 140 09/21/2019   K 4.3 09/21/2019   CL 105 09/21/2019   CREATININE 1.10 09/21/2019   BUN 22 09/21/2019   CO2 28 09/21/2019   TSH 3.17 09/21/2019   INR 1.02 05/26/2015   HGBA1C 5.8 09/21/2019    DG Shoulder Right  Result Date: 10/29/2019 CLINICAL DATA:  Fall last night decreased range of motion EXAM: RIGHT SHOULDER - 2+ VIEW COMPARISON:  None FINDINGS: Humeral head is located. No signs of proximal humeral fracture. Irregularity along the inferior surface of the glenoid. Acromioclavicular degenerative changes. Subacromial spurring. IMPRESSION: Irregularity along the inferior surface of the glenoid could represent an avulsion injury or sequela of  degenerative change. No signs of proximal humeral fracture or dislocation. Electronically Signed   By: Zetta Bills M.D.   On: 10/29/2019 15:34   DG Wrist Complete Left  Result Date: 10/29/2019 CLINICAL DATA:  Fall last night swelling and pain decreased range of motion. EXAM: LEFT WRIST - COMPLETE 3+ VIEW COMPARISON:  None FINDINGS: Minimally displaced fracture of the distal pole the scaphoid, along the lateral margin. Suggestion correlate along the medial aspect of the scaphoid as well, degenerative changes at the first carpometacarpal and second carpometacarpal joints as well as with respect to scaphoid and trapezium as well as scaphoid and  trapezoid articulation. Concomitant fractures of the distal carpal row be difficult to exclude as there is surrounding degenerative change. There may be a nondisplaced fracture of the trapezium best seen on the oblique view. Distal radius is intact. The distal radioulnar joint is normal. IMPRESSION: 1. Minimally displaced fracture of the distal pole of the scaphoid. 2. Suggestion of nondisplaced fracture of the trapezium. 3. Degenerative changes at the first carpometacarpal and second carpometacarpal joints as well as at the second carpometacarpal articulation. 4. Osteopenia and concomitant degenerative changes limit assessment for additional fractures. Electronically Signed   By: Zetta Bills M.D.   On: 10/29/2019 15:31   CT Head Wo Contrast  Result Date: 10/29/2019 CLINICAL DATA:  Facial trauma last night. Abrasion to right cheek. Confusion. EXAM: CT HEAD WITHOUT CONTRAST TECHNIQUE: Contiguous axial images were obtained from the base of the skull through the vertex without intravenous contrast. COMPARISON:  January 08, 2016 FINDINGS: Brain: No subdural, epidural, or subarachnoid hemorrhage. Cerebellum, brainstem, and basal cisterns are normal. Ventricles and sulci are unremarkable. No mass effect or midline shift. No acute cortical ischemia or infarct.  Vascular: No hyperdense vessel or unexpected calcification. Skull: Normal. Negative for fracture or focal lesion. Sinuses/Orbits: No acute finding. 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 However, the comparison study was obtained with the cervical spine in extension. IMPRESSION: 1. 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 It was not seen on the scout view from the comparison head CT from May 15, 2017. However, the comparison film was obtained with the cervical spine in flexion. There is no obvious prevertebral soft tissue thickening on the scout view. Recommend clinical correlation. If there is concern for cervical spine injury, recommend dedicated imaging. 2. No acute intracranial abnormalities. These results will be called to the ordering clinician or representative by the Radiologist Assistant, and communication documented in the PACS or zVision Dashboard. The findings are being called and documented immediately. Electronically Signed   By: Dorise Bullion III M.D   On: 10/29/2019 16:12    Assessment & Plan:    Follow-up: No follow-ups on file.  Walker Kehr, MD

## 2019-12-22 NOTE — Assessment & Plan Note (Signed)
In the cast Prolia vs other meds discussed Vit D F/u w/Ortho

## 2019-12-22 NOTE — Assessment & Plan Note (Signed)
BMET Good hydration

## 2019-12-22 NOTE — Assessment & Plan Note (Signed)
Prolia vs other meds discussed Vit d X ray IMPRESSION: 1. Minimally displaced fracture of the distal pole of the scaphoid. 2. Suggestion of nondisplaced fracture of the trapezium. 3. Degenerative changes at the first carpometacarpal and second carpometacarpal joints as well as at the second carpometacarpal articulation. 4. Osteopenia and concomitant degenerative changes limit assessment for additional fractures.   Electronically Signed   By: Zetta Bills M.D.   On: 10/29/2019 15:31

## 2019-12-29 ENCOUNTER — Encounter: Payer: Self-pay | Admitting: Orthopaedic Surgery

## 2019-12-29 ENCOUNTER — Ambulatory Visit (INDEPENDENT_AMBULATORY_CARE_PROVIDER_SITE_OTHER): Payer: Medicare HMO

## 2019-12-29 ENCOUNTER — Other Ambulatory Visit: Payer: Self-pay

## 2019-12-29 ENCOUNTER — Ambulatory Visit (INDEPENDENT_AMBULATORY_CARE_PROVIDER_SITE_OTHER): Payer: Medicare HMO | Admitting: Orthopaedic Surgery

## 2019-12-29 VITALS — BP 126/78 | HR 78 | Ht 66.0 in | Wt 179.0 lb

## 2019-12-29 DIAGNOSIS — S62025A Nondisplaced fracture of middle third of navicular [scaphoid] bone of left wrist, initial encounter for closed fracture: Secondary | ICD-10-CM

## 2019-12-29 NOTE — Progress Notes (Signed)
Office Visit Note   Patient: Claudia [name]           Date of Birth: Mar 29, 1946           MRN: [id number] Visit Date: 12/29/2019              Requested by: Cassandria Anger, MD [place],  [place] [address] PCP: Plotnikov, Evie Lacks, MD   Assessment & Plan: Visit Diagnoses:  1. Closed nondisplaced fracture of middle third of scaphoid bone of left wrist, initial encounter     Plan: Patient shows some radiographic healing.  She still clinically tender in the snuffbox.  We will place in a wrist splint she can remove it for washing her hand and applying lotion and then will reapply the splint.  Return 1 month repeat x-rays AP lateral and scaphoid view on return.  Follow-Up Instructions: No follow-ups on file.   Orders:  Orders Placed This Encounter  Procedures  . XR Wrist Complete Left   No orders of the defined types were placed in this encounter.     Procedures: No procedures performed   Clinical Data: No additional findings.   Subjective: Chief Complaint  Patient presents with  . Left Wrist - Fracture, Follow-up    Fall 10/28/2019    HPI 74 year old female returns follow-up above fall 10/28/2019 with scaphoid waist fracture.  She has been immobilized in a cast.  Cast removed x-rays demonstrate interval healing she still has tenderness at the fracture site.  We will place her in a wrist splint that she can remove to wash her hand and then reapply the splint will return in 1 month for final x-rays.  Review of Systems unchanged.   Objective: Vital Signs: BP 126/78   Pulse 78   Ht 5\' 6"  (1.676 m)   Wt 179 lb (81.2 kg)   BMI 28.89 kg/m   Physical Exam Constitutional:      Appearance: She is well-developed.  HENT:     Head: Normocephalic.     Right Ear: External ear normal.     Left Ear: External ear normal.  Eyes:     Pupils: Pupils are equal, round, and reactive to light.  Neck:     Thyroid: No thyromegaly.     Trachea: No tracheal  deviation.  Cardiovascular:     Rate and Rhythm: Normal rate.  Pulmonary:     Effort: Pulmonary effort is normal.  Abdominal:     Palpations: Abdomen is soft.  Skin:    General: Skin is warm and dry.  Neurological:     Mental Status: She is alert and oriented to person, place, and time.  Psychiatric:        Behavior: Behavior normal.     Ortho Exam patient has tenderness of the tuberosity scaphoid and also the snuffbox.  Skin over the wrist and hand is normal.  Specialty Comments:  No specialty comments available.  Imaging: No results found.   PMFS History: Patient Active Problem List   Diagnosis Date Noted  . Osteopenia 12/22/2019  . Wrist fracture, closed 12/22/2019  . Scaphoid fracture, wrist, closed 11/02/2019  . Toe contusion 11/02/2019  . Facial trauma, initial encounter 10/29/2019  . Confusion 10/29/2019  . Left wrist pain 10/29/2019  . Ingrowing nail, left great toe 09/21/2019  . Memory loss 09/21/2019  . Occipital neuralgia of right side 06/17/2019  . Foot pain, bilateral 01/07/2019  . Contusion of hip 10/07/2018  . Right shoulder pain  10/07/2018  . CRI (chronic renal insufficiency), stage 3 (moderate) 07/01/2018  . Dog scratch 05/12/2018  . Sinusitis, chronic 12/02/2017  . Paresthesia 11/06/2017  . Skin rash 08/07/2017  . Headache 05/13/2017  . OAB (overactive bladder) 05/14/2016  . Concussion with loss of consciousness 01/05/2016  . Neoplasm of uncertain behavior of skin 01/05/2016  . Acute upper respiratory infection 09/12/2015  . Diabetes type 2, controlled ([place]) 09/12/2015  . 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 ([place]) 06/01/2015  . Diastolic dysfunction without heart failure 05/14/2015  . Preop exam for internal medicine 05/10/2015  . Hamstring tendonitis of right thigh 01/27/2015  . Forgetfulness 11/02/2014  . Pes planus of both feet 08/08/2014  . Avulsion fracture of lateral malleolus 06/07/2014  . Sprain of ankle, unspecified  site 02/01/2014  . Tibialis posterior tendinitis 12/03/2013  . Ankle pain, right 09/14/2013  . Hyperglycemia 09/14/2013  . Leg pain 04/29/2012  . Knee pain 04/29/2012  . Well adult exam 10/23/2011  . 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 Weight gain 04/10/2011  . LIPOMA 10/17/2010  . HEMATURIA UNSPECIFIED 08/27/2010  . Abdominal pain 07/16/2010  . WEIGHT LOSS 05/09/2010  . FREQUENCY, URINARY 05/09/2010  . ABDOMINAL PAIN, EPIGASTRIC 05/09/2010  . VERTIGO 02/06/2010  . NECK PAIN 03/28/2009  . GALLSTONES 12/07/2008  . HEMORRHOIDS, INTERNAL 11/23/2008  . ESOPHAGEAL STRICTURE 11/23/2008  . HIATAL HERNIA 11/23/2008  . IBS 11/23/2008  . Upper respiratory infection 11/01/2008  . Chest pain, unspecified 11/01/2008  . Pernicious anemia 08/16/2008  . Chronic fatigue 08/16/2008  . Anxiety state 11/25/2007  . Depression 11/25/2007  . Osteoarthritis 11/25/2007  . CRAMPS,LEG 11/25/2007  . BREAST CANCER, HX OF 11/25/2007  . RESTLESS LEG SYNDROME 07/23/2007  . Essential hypertension 07/23/2007  . Fibromyalgia 07/23/2007  . NEURALGIA 07/23/2007  . INSOMNIA 07/23/2007   Past Medical History:  Diagnosis Date  . Acute upper respiratory infections of unspecified site   . Anxiety state, unspecified   . Breast cancer ([place])   . Chest pain, unspecified   . Cramp of limb   . Depressive disorder, not elsewhere classified   . Diverticulitis    pt stated  . DM (diabetes mellitus) ([place])   . Headache(784.0)   . Hiatal hernia   . History of cold sores   . IBS (irritable bowel syndrome)   . Insomnia, unspecified   . Internal hemorrhoids without mention of complication   . Irritable bowel syndrome   . Myalgia and myositis, unspecified   . Neuralgia of chest    post op  . Neuralgia, neuritis, and radiculitis, unspecified   . Osteoarthritis   . Osteoarthrosis, unspecified whether generalized or localized, unspecified site   . Other B-complex deficiencies   . Other malaise and fatigue   . Personal  history of malignant neoplasm of breast   . Pneumonia    hx of  . Restless legs syndrome (RLS)   . Seizures ([place])    "mini seizures" 15 yrs. ago. Pt. states did have work up and was normal  . Stricture and stenosis of esophagus   . Unspecified essential hypertension     Family History  Problem Relation Age of Onset  . Diabetes Mother   . Kidney disease Mother   . Lymphoma Mother   . Diabetes Sister   . Heart disease Sister   . Stroke Sister   . Diabetes Brother   . Kidney disease Brother   . Coronary artery disease Brother   . Breast cancer Paternal Grandmother   .  Coronary artery disease Maternal Grandmother     Past Surgical History:  Procedure Laterality Date  . ABDOMINAL HYSTERECTOMY    . breast reconstruction surgery bilateral - 8 yrs. ago    . CHOLECYSTECTOMY    . deviated septum surgery x 3    . hx. of bell's palsy at 14 yrs. old    . MASTECTOMY     bilateral total  . 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 rotator cuff surgery -left shoulder- 8 yrs. ago    . salva gland removed from left side of neck 60 yrs. ago    . TONSILLECTOMY    . TOTAL KNEE ARTHROPLASTY Left 06/01/2015   Procedure: LEFT TOTAL KNEE ARTHROPLASTY;  Surgeon: Rod Can, MD;  Location: [facility];  Service: Orthopedics;  Laterality: Left;   Social History   Occupational History  . Occupation: Retired  Tobacco Use  . Smoking status: Former Smoker    Quit date: 11/26/1971    Years since quitting: 48.1  . Smokeless tobacco: Never Used  Substance and Sexual Activity  . Alcohol use: No  . Drug use: No  . Sexual activity: Yes

## 2020-01-26 ENCOUNTER — Ambulatory Visit (INDEPENDENT_AMBULATORY_CARE_PROVIDER_SITE_OTHER): Payer: Medicare HMO

## 2020-01-26 ENCOUNTER — Ambulatory Visit: Payer: Medicare HMO | Admitting: Orthopaedic Surgery

## 2020-01-26 ENCOUNTER — Encounter: Payer: Self-pay | Admitting: Orthopaedic Surgery

## 2020-01-26 ENCOUNTER — Other Ambulatory Visit: Payer: Self-pay

## 2020-01-26 VITALS — Ht 66.0 in | Wt 179.0 lb

## 2020-01-26 DIAGNOSIS — S62025A Nondisplaced fracture of middle third of navicular [scaphoid] bone of left wrist, initial encounter for closed fracture: Secondary | ICD-10-CM

## 2020-01-26 NOTE — Progress Notes (Signed)
Office Visit Note   Patient: Claudia [name]           Date of Birth: 24-Sep-1946           MRN: [id number] Visit Date: 01/26/2020              Requested by: Cassandria Anger, MD [place],  [place] [address] PCP: Plotnikov, Evie Lacks, MD   Assessment & Plan: Visit Diagnoses:  1. Closed nondisplaced fracture of middle third of scaphoid bone of left wrist, initial encounter     Plan: Patient can discontinue splint.  She will work on I P left thumb active and passive range of motion.  She was able to demonstrate appropriate blocking of the proximal phalanx.  This should loosen up and I will check her back again in 1 month.  Follow-Up Instructions: No follow-ups on file.   Orders:  Orders Placed This Encounter  Procedures  . XR Wrist Complete Left   No orders of the defined types were placed in this encounter.     Procedures: No procedures performed   Clinical Data: No additional findings.   Subjective: Chief Complaint  Patient presents with  . Left Wrist - Follow-up    DOI 10/28/2019    HPI patient returns for follow-up of scaphoid fracture from a fall 10/28/2019.  Splint is removed she states she has not been able to move her left IP joint of her thumb.  Pain in her wrist is resolved.  No numbness or tingling in her fingers.  She does have arthritis DIP joints opposite hand.  Review of Systems reviewed updated unchanged.   Objective: Vital Signs: Ht 5\' 6"  (1.676 m)   Wt 179 lb (81.2 kg)   BMI 28.89 kg/m   Physical Exam Constitutional:      Appearance: She is well-developed.  HENT:     Head: Normocephalic.     Right Ear: External ear normal.     Left Ear: External ear normal.  Eyes:     Pupils: Pupils are equal, round, and reactive to light.  Neck:     Thyroid: No thyromegaly.     Trachea: No tracheal deviation.  Cardiovascular:     Rate and Rhythm: Normal rate.  Pulmonary:     Effort: Pulmonary effort is normal.  Abdominal:   Palpations: Abdomen is soft.  Skin:    General: Skin is warm and dry.  Neurological:     Mental Status: She is alert and oriented to person, place, and time.  Psychiatric:        Behavior: Behavior normal.     Ortho Exam patient has no tenderness over the snuffbox or tuberosity of the scaphoid.  Patient is unable to actively flex extend her thumb.  Passive pressure was applied and jointed gently flex to 40 degrees with some adhesions.  Collateral ligaments are stable sensation in the thumb is intact.  Patient does have some osteophytes present palpable at the IP joint of the thumb of left hand.  She has 70 to 80 degrees flexion of her opposite right IP joint of the thumb without spurring.  Specialty Comments:  No specialty comments available.  Imaging: No results found.   PMFS History: Patient Active Problem List   Diagnosis Date Noted  . Osteopenia 12/22/2019  . Wrist fracture, closed 12/22/2019  . Scaphoid fracture, wrist, closed 11/02/2019  . Toe contusion 11/02/2019  . Facial trauma, initial encounter 10/29/2019  . Confusion 10/29/2019  . Left  wrist pain 10/29/2019  . Ingrowing nail, left great toe 09/21/2019  . Memory loss 09/21/2019  . Occipital neuralgia of right side 06/17/2019  . Foot pain, bilateral 01/07/2019  . Contusion of hip 10/07/2018  . Right shoulder pain 10/07/2018  . CRI (chronic renal insufficiency), stage 3 (moderate) 07/01/2018  . Dog scratch 05/12/2018  . Sinusitis, chronic 12/02/2017  . Paresthesia 11/06/2017  . Skin rash 08/07/2017  . Headache 05/13/2017  . OAB (overactive bladder) 05/14/2016  . Concussion with loss of consciousness 01/05/2016  . Neoplasm of uncertain behavior of skin 01/05/2016  . Acute upper respiratory infection 09/12/2015  . Diabetes type 2, controlled ([place]) 09/12/2015  . 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 ([place]) 06/01/2015  . Diastolic dysfunction without heart failure 05/14/2015  . Preop exam for internal  medicine 05/10/2015  . Hamstring tendonitis of right thigh 01/27/2015  . Forgetfulness 11/02/2014  . Pes planus of both feet 08/08/2014  . Avulsion fracture of lateral malleolus 06/07/2014  . Sprain of ankle, unspecified site 02/01/2014  . Tibialis posterior tendinitis 12/03/2013  . Ankle pain, right 09/14/2013  . Hyperglycemia 09/14/2013  . Leg pain 04/29/2012  . Knee pain 04/29/2012  . Well adult exam 10/23/2011  . 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 Weight gain 04/10/2011  . LIPOMA 10/17/2010  . HEMATURIA UNSPECIFIED 08/27/2010  . Abdominal pain 07/16/2010  . WEIGHT LOSS 05/09/2010  . FREQUENCY, URINARY 05/09/2010  . ABDOMINAL PAIN, EPIGASTRIC 05/09/2010  . VERTIGO 02/06/2010  . NECK PAIN 03/28/2009  . GALLSTONES 12/07/2008  . HEMORRHOIDS, INTERNAL 11/23/2008  . ESOPHAGEAL STRICTURE 11/23/2008  . HIATAL HERNIA 11/23/2008  . IBS 11/23/2008  . Upper respiratory infection 11/01/2008  . Chest pain, unspecified 11/01/2008  . Pernicious anemia 08/16/2008  . Chronic fatigue 08/16/2008  . Anxiety state 11/25/2007  . Depression 11/25/2007  . Osteoarthritis 11/25/2007  . CRAMPS,LEG 11/25/2007  . BREAST CANCER, HX OF 11/25/2007  . RESTLESS LEG SYNDROME 07/23/2007  . Essential hypertension 07/23/2007  . Fibromyalgia 07/23/2007  . NEURALGIA 07/23/2007  . INSOMNIA 07/23/2007   Past Medical History:  Diagnosis Date  . Acute upper respiratory infections of unspecified site   . Anxiety state, unspecified   . Breast cancer ([place])   . Chest pain, unspecified   . Cramp of limb   . Depressive disorder, not elsewhere classified   . Diverticulitis    pt stated  . DM (diabetes mellitus) ([place])   . Headache(784.0)   . Hiatal hernia   . History of cold sores   . IBS (irritable bowel syndrome)   . Insomnia, unspecified   . Internal hemorrhoids without mention of complication   . Irritable bowel syndrome   . Myalgia and myositis, unspecified   . Neuralgia of chest    post op  . Neuralgia,  neuritis, and radiculitis, unspecified   . Osteoarthritis   . Osteoarthrosis, unspecified whether generalized or localized, unspecified site   . Other B-complex deficiencies   . Other malaise and fatigue   . Personal history of malignant neoplasm of breast   . Pneumonia    hx of  . Restless legs syndrome (RLS)   . Seizures ([place])    "mini seizures" 15 yrs. ago. Pt. states did have work up and was normal  . Stricture and stenosis of esophagus   . Unspecified essential hypertension     Family History  Problem Relation Age of Onset  . Diabetes Mother   . Kidney disease Mother   . Lymphoma Mother   .  Diabetes Sister   . Heart disease Sister   . Stroke Sister   . Diabetes Brother   . Kidney disease Brother   . Coronary artery disease Brother   . Breast cancer Paternal Grandmother   . Coronary artery disease Maternal Grandmother     Past Surgical History:  Procedure Laterality Date  . ABDOMINAL HYSTERECTOMY    . breast reconstruction surgery bilateral - 8 yrs. ago    . CHOLECYSTECTOMY    . deviated septum surgery x 3    . hx. of bell's palsy at 14 yrs. old    . MASTECTOMY     bilateral total  . 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 rotator cuff surgery -left shoulder- 8 yrs. ago    . salva gland removed from left side of neck 60 yrs. ago    . TONSILLECTOMY    . TOTAL KNEE ARTHROPLASTY Left 06/01/2015   Procedure: LEFT TOTAL KNEE ARTHROPLASTY;  Surgeon: Rod Can, MD;  Location: [facility];  Service: Orthopedics;  Laterality: Left;   Social History   Occupational History  . Occupation: Retired  Tobacco Use  . Smoking status: Former Smoker    Quit date: 11/26/1971    Years since quitting: 48.2  . Smokeless tobacco: Never Used  Substance and Sexual Activity  . Alcohol use: No  . Drug use: No  . Sexual activity: Yes

## 2020-02-29 ENCOUNTER — Ambulatory Visit: Payer: Medicare HMO | Admitting: Orthopaedic Surgery

## 2020-03-23 ENCOUNTER — Encounter: Payer: Medicare HMO | Admitting: Internal Medicine

## 2020-03-27 ENCOUNTER — Other Ambulatory Visit: Payer: Self-pay

## 2020-03-27 ENCOUNTER — Ambulatory Visit (INDEPENDENT_AMBULATORY_CARE_PROVIDER_SITE_OTHER): Payer: Medicare HMO

## 2020-03-27 ENCOUNTER — Ambulatory Visit (INDEPENDENT_AMBULATORY_CARE_PROVIDER_SITE_OTHER): Payer: Medicare HMO | Admitting: Internal Medicine

## 2020-03-27 ENCOUNTER — Encounter: Payer: Self-pay | Admitting: Internal Medicine

## 2020-03-27 VITALS — BP 130/70 | HR 66 | Temp 97.9°F | Resp 16 | Ht 66.0 in | Wt 180.8 lb

## 2020-03-27 VITALS — BP 130/70 | HR 66 | Temp 97.9°F | Ht 66.0 in | Wt 180.0 lb

## 2020-03-27 DIAGNOSIS — N183 Chronic kidney disease, stage 3 unspecified: Secondary | ICD-10-CM

## 2020-03-27 DIAGNOSIS — R413 Other amnesia: Secondary | ICD-10-CM | POA: Diagnosis not present

## 2020-03-27 DIAGNOSIS — E538 Deficiency of other specified B group vitamins: Secondary | ICD-10-CM | POA: Diagnosis not present

## 2020-03-27 DIAGNOSIS — E785 Hyperlipidemia, unspecified: Secondary | ICD-10-CM | POA: Diagnosis not present

## 2020-03-27 DIAGNOSIS — Z0000 Encounter for general adult medical examination without abnormal findings: Secondary | ICD-10-CM

## 2020-03-27 DIAGNOSIS — F329 Major depressive disorder, single episode, unspecified: Secondary | ICD-10-CM

## 2020-03-27 DIAGNOSIS — I10 Essential (primary) hypertension: Secondary | ICD-10-CM | POA: Diagnosis not present

## 2020-03-27 DIAGNOSIS — E119 Type 2 diabetes mellitus without complications: Secondary | ICD-10-CM

## 2020-03-27 DIAGNOSIS — D510 Vitamin B12 deficiency anemia due to intrinsic factor deficiency: Secondary | ICD-10-CM

## 2020-03-27 LAB — BASIC METABOLIC PANEL
BUN: 18 mg/dL (ref 6–23)
CO2: 29 mEq/L (ref 19–32)
Calcium: 9.5 mg/dL (ref 8.4–10.5)
Chloride: 106 mEq/L (ref 96–112)
Creatinine, Ser: 1.02 mg/dL (ref 0.40–1.20)
GFR: 52.66 mL/min — ABNORMAL LOW (ref >60.00)
Glucose, Bld: 125 mg/dL — ABNORMAL HIGH (ref 70–99)
Potassium: 4.5 mEq/L (ref 3.5–5.1)
Sodium: 141 mEq/L (ref 135–145)

## 2020-03-27 LAB — CBC WITH DIFFERENTIAL/PLATELET
Basophils Absolute: 0 10*3/uL (ref 0.0–0.1)
Basophils Relative: 0.4 % (ref 0.0–3.0)
Eosinophils Absolute: 0.1 10*3/uL (ref 0.0–0.7)
Eosinophils Relative: 1.5 % (ref 0.0–5.0)
HCT: 41.9 % (ref 36.0–46.0)
Hemoglobin: 14.2 g/dL (ref 12.0–15.0)
Lymphocytes Relative: 24.2 % (ref 12.0–46.0)
Lymphs Abs: 1.9 10*3/uL (ref 0.7–4.0)
MCHC: 33.9 g/dL (ref 30.0–36.0)
MCV: 91.8 fl (ref 78.0–100.0)
Monocytes Absolute: 0.4 10*3/uL (ref 0.1–1.0)
Monocytes Relative: 4.5 % (ref 3.0–12.0)
Neutro Abs: 5.6 10*3/uL (ref 1.4–7.7)
Neutrophils Relative %: 69.4 % (ref 43.0–77.0)
Platelets: 275 10*3/uL (ref 150.0–400.0)
RBC: 4.57 Mil/uL (ref 3.87–5.11)
RDW: 13.6 % (ref 11.5–15.5)
WBC: 8 10*3/uL (ref 4.0–10.5)

## 2020-03-27 LAB — HEPATIC FUNCTION PANEL
ALT: 10 U/L (ref 0–35)
AST: 17 U/L (ref 0–37)
Albumin: 4.3 g/dL (ref 3.5–5.2)
Alkaline Phosphatase: 80 U/L (ref 39–117)
Bilirubin, Direct: 0.1 mg/dL (ref 0.0–0.3)
Total Bilirubin: 0.6 mg/dL (ref 0.2–1.2)
Total Protein: 7 g/dL (ref 6.0–8.3)

## 2020-03-27 LAB — URINALYSIS
Bilirubin Urine: NEGATIVE
Hgb urine dipstick: NEGATIVE
Ketones, ur: NEGATIVE
Leukocytes,Ua: NEGATIVE
Nitrite: NEGATIVE
Specific Gravity, Urine: 1.02 (ref 1.000–1.030)
Total Protein, Urine: NEGATIVE
Urine Glucose: NEGATIVE
Urobilinogen, UA: 0.2 (ref 0.0–1.0)
pH: 6.5 (ref 5.0–8.0)

## 2020-03-27 LAB — LIPID PANEL
Cholesterol: 235 mg/dL — ABNORMAL HIGH (ref 0–200)
HDL: 41.2 mg/dL (ref >39.00)
LDL Cholesterol: 169 mg/dL — ABNORMAL HIGH (ref 0–99)
NonHDL: 193.76
Total CHOL/HDL Ratio: 6
Triglycerides: 125 mg/dL (ref 0.0–149.0)
VLDL: 25 mg/dL (ref 0.0–40.0)

## 2020-03-27 LAB — TSH: TSH: 4.42 u[IU]/mL (ref 0.35–4.50)

## 2020-03-27 NOTE — Addendum Note (Signed)
Addended by: Boris Lown B on: 03/27/2020 10:27 AM   Modules accepted: Orders

## 2020-03-27 NOTE — Assessment & Plan Note (Signed)
Labs Careful w/ Metformin

## 2020-03-27 NOTE — Progress Notes (Addendum)
Subjective:   Claudia [name] is a 74 y.o. female who presents for Medicare Annual (Subsequent) preventive examination.  Review of Systems:  No ROS. Medicare Wellness Virtual Visit. Additional risk factors are reflected in social history. Cardiac Risk Factors include: advanced age (>71men, >85 women);diabetes mellitus;hypertension  Sleep Patterns: Has issues falling asleep at night. Naps during the day. Home Safety/Smoke Alarms: Feels safe in home; uses home alarm. Smoke alarms in place. Living environment:2-story home;  Lives with husband on the main level; no needs for DME, good support system from daughter. Seat Belt Safety/Bike Helmet: Wears seat belt.    Objective:     Vitals: BP 130/70 (BP Location: Right Arm, Patient Position: Sitting, Cuff Size: Normal)   Pulse 66   Temp 97.9 F (36.6 C)   Resp 16   Ht 5\' 6"  (1.676 m)   Wt 180 lb 12.8 oz (82 kg)   SpO2 98%   BMI 29.18 kg/m   Body mass index is 29.18 kg/m.  Advanced Directives 03/27/2020 05/13/2017 03/17/2017 06/02/2015 05/26/2015  Does Patient Have a Medical Advance Directive? Yes Yes Yes Yes Yes  Type of Advance Directive Living will;Healthcare Power of [place];Living will [place];Living will Living will Living will  Does patient want to make changes to medical advance directive? No - Patient declined - - No - Patient declined No - Patient declined  Copy of [place] in Chart? No - copy requested No - copy requested No - copy requested - Yes    Tobacco Social History   Tobacco Use  Smoking Status Former Smoker  . Quit date: 11/26/1971  . Years since quitting: 48.3  Smokeless Tobacco Never Used     Counseling given: No   Clinical Intake:  Pre-visit preparation completed: Yes  Pain : No/denies pain Pain Score: 0-No pain     BMI - recorded: 29.2 Nutritional Status: BMI 25 -29 Overweight Nutritional Risks: Nausea/ vomitting/  diarrhea Diabetes: Yes CBG done?: No Did pt. bring in CBG monitor from home?: No  How often do you need to have someone help you when you read instructions, pamphlets, or other written materials from your doctor or pharmacy?: 1 - Never What is the last grade level you completed in school?: 12th grade; retired from [employer] Needed?: No  Information entered by :: [employer]. Lowell [employer], LPN  Past Medical History:  Diagnosis Date  . Acute upper respiratory infections of unspecified site   . Anxiety state, unspecified   . Breast cancer ([place])   . Chest pain, unspecified   . Cramp of limb   . Depressive disorder, not elsewhere classified   . Diverticulitis    pt stated  . DM (diabetes mellitus) ([place])   . Headache(784.0)   . Hiatal hernia   . History of cold sores   . IBS (irritable bowel syndrome)   . Insomnia, unspecified   . Internal hemorrhoids without mention of complication   . Irritable bowel syndrome   . Myalgia and myositis, unspecified   . Neuralgia of chest    post op  . Neuralgia, neuritis, and radiculitis, unspecified   . Osteoarthritis   . Osteoarthrosis, unspecified whether generalized or localized, unspecified site   . Other B-complex deficiencies   . Other malaise and fatigue   . Personal history of malignant neoplasm of breast   . Pneumonia    hx of  . Restless legs syndrome (RLS)   . Seizures (  [place])    "mini seizures" 15 yrs. ago. Pt. states did have work up and was normal  . Stricture and stenosis of esophagus   . Unspecified essential hypertension    Past Surgical History:  Procedure Laterality Date  . ABDOMINAL HYSTERECTOMY    . breast reconstruction surgery bilateral - 8 yrs. ago    . CHOLECYSTECTOMY    . deviated septum surgery x 3    . hx. of bell's palsy at 14 yrs. old    . MASTECTOMY     bilateral total  . 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 rotator cuff surgery -left shoulder- 8 yrs. ago    . salva gland removed  from left side of neck 60 yrs. ago    . TONSILLECTOMY    . TOTAL KNEE ARTHROPLASTY Left 06/01/2015   Procedure: LEFT TOTAL KNEE ARTHROPLASTY;  Surgeon: Rod Can, MD;  Location: [facility];  Service: Orthopedics;  Laterality: Left;   Family History  Problem Relation Age of Onset  . Diabetes Mother   . Kidney disease Mother   . Lymphoma Mother   . Diabetes Sister   . Heart disease Sister   . Stroke Sister   . Diabetes Brother   . Kidney disease Brother   . Coronary artery disease Brother   . Breast cancer Paternal Grandmother   . Coronary artery disease Maternal Grandmother    Social History   Socioeconomic History  . Marital status: Married    Spouse name: Not on file  . Number of children: Not on file  . Years of education: Not on file  . Highest education level: Not on file  Occupational History  . Occupation: Retired  Tobacco Use  . Smoking status: Former Smoker    Quit date: 11/26/1971    Years since quitting: 48.3  . Smokeless tobacco: Never Used  Substance and Sexual Activity  . Alcohol use: No  . Drug use: No  . Sexual activity: Yes  Other Topics Concern  . 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 Difficulty of Paying Living Expenses:   Food Insecurity:   . Worried About Charity fundraiser in the Last Year:   . Arboriculturist in the Last Year:   Transportation Needs:   . Film/video editor (Medical):   Marland Kitchen Lack of Transportation (Non-Medical):   Physical Activity:   . Days of Exercise per Week:   . Minutes of Exercise per Session:   Stress:   . Feeling of Stress :   Social Connections:   . Frequency of Communication with Friends and Family:   . Frequency of Social Gatherings with Friends and Family:   . Attends Religious Services:   . Active Member of Clubs or  Organizations:   . Attends Archivist Meetings:   Marland Kitchen Marital Status:     Outpatient Encounter Medications as of 03/27/2020  Medication Sig  . amLODipine (NORVASC) 2.5 MG tablet TAKE 1 TABLET EVERY DAY  . cholecalciferol (VITAMIN D) 1000 units tablet Take 1 tablet by mouth daily.  . cyanocobalamin (,VITAMIN B-12,) 1000 MCG/ML injection Inject 1 mL (1,000 mcg total) into the skin every 14 (fourteen) days.  Marland Kitchen  DULoxetine (CYMBALTA) 60 MG capsule TAKE 1 CAPSULE EVERY DAY FOR DEPRESSION  . gabapentin (NEURONTIN) 600 MG tablet TAKE 1 TABLET THREE TIMES DAILY  . memantine (NAMENDA) 10 MG tablet Take 1 tablet (10 mg total) by mouth 2 (two) times daily.  . metFORMIN (GLUCOPHAGE) 500 MG tablet TAKE 1 TABLET EVERY DAY WITH BREAKFAST  . mirabegron ER (MYRBETRIQ) 50 MG TB24 tablet Take 1 tablet (50 mg total) by mouth daily.  Marland Kitchen olmesartan (BENICAR) 20 MG tablet TAKE 1 TABLET EVERY DAY  . omeprazole (PRILOSEC) 40 MG capsule TAKE 1 CAPSULE TWICE DAILY  . SYRINGE-NEEDLE, DISP, 3 ML (BD ECLIPSE SYRINGE) 25G X 1" 3 ML MISC As directed SQ for B12 shots   No facility-administered encounter medications on file as of 03/27/2020.    Activities of Daily Living In your present state of health, do you have any difficulty performing the following activities: 03/27/2020  Hearing? N  Vision? 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 Y  Walking or climbing stairs? N  Dressing or bathing? N  Doing errands, shopping? N  Preparing Food and eating ? N  Using the Toilet? N  In the past six months, have you accidently leaked urine? Y  Do you have problems with loss of bowel control? Y  Managing your Medications? N  Managing your Finances? N  Housekeeping or managing your Housekeeping? N  Some recent data might be hidden    Patient Care Team: Plotnikov, Evie Lacks, MD as PCP - General Swinteck, Aaron Edelman, MD as Consulting Physician (Orthopedic Surgery) Marybelle Killings, MD as Consulting Physician (Orthopedic Surgery)    Assessment:   This is a routine wellness examination for Audie.  Exercise Activities and Dietary recommendations Current Exercise Habits: The patient does not participate in regular exercise at present(walks her dog everyday), Exercise limited by: orthopedic condition(s);psychological condition(s)  Goals    . <enter goal here>    . Client understands the importance of follow-up with providers by attending scheduled visits (pt-stated)     Would like to have a better mental state.    . Enjoy my grand-daughter as much as possible       Fall Risk Fall Risk  03/27/2020 08/28/2018 05/13/2017 01/24/2017 01/30/2016  Falls in the past year? 1 Yes Yes Yes Yes  Number falls in past yr: 0 1 2 or more 1 2 or more  Injury with Fall? 1 Yes - Yes Yes  Risk Factor Category  - - - High Fall Risk High Fall Risk  Risk for fall due to : Impaired balance/gait;Impaired vision;Orthopedic patient - - - -  Follow up Falls evaluation completed;Education provided Education provided Falls prevention discussed;Education provided Falls evaluation completed -   Is the patient's home free of loose throw rugs in walkways, pet beds, electrical cords, etc?   yes      Grab bars in the bathroom? yes      Handrails on the stairs?   yes      Adequate lighting?   yes   Depression Screen PHQ 2/9 Scores 03/27/2020 05/13/2017 01/24/2017  PHQ - 2 Score 4 2 0  PHQ- 9 Score 16 8 -     Cognitive Function MMSE - Mini Mental State Exam 05/13/2017  Orientation to time 5  Orientation to Place 5  Registration 3  Attention/ Calculation 5  Recall 2  Language- name 2 objects 2  Language- repeat 1  Language- follow 3 step command 3  Language- read & follow direction 1  Write a sentence 1  Copy design 1  Total score 29     6CIT Screen 03/27/2020  What Year? 0 points  What month? 0 points  What time? 0 points  Count back from 20 0 points  Months in reverse 0 points   Repeat phrase 2 points  Total Score 2    Immunization History  Administered Date(s) Administered  . H1N1 11/01/2008  . Influenza Split 08/20/2011  . Influenza Whole 09/30/2002, 08/16/2008, 07/16/2010  . Influenza, High Dose Seasonal PF 10/02/2015, 10/01/2018  . Influenza-Unspecified 08/20/2016  . Pneumococcal Conjugate-13 11/02/2014  . Pneumococcal Polysaccharide-23 10/23/2011  . Tdap 10/23/2011    Qualifies for Shingles Vaccine? Yes  Screening Tests Health Maintenance  Topic Date Due  . Hepatitis C Screening  Never done  . FOOT EXAM  Never done  . COVID-19 Vaccine (1) Never done  . OPHTHALMOLOGY EXAM  07/28/2019  . HEMOGLOBIN A1C  03/20/2020  . INFLUENZA VACCINE  06/18/2020  . TETANUS/TDAP  10/22/2021  . COLONOSCOPY  03/17/2022  . DEXA SCAN  Completed  . PNA vac Low Risk Adult  Completed    Cancer Screenings: Lung: Low Dose CT Chest recommended if Age 76-80 years, 30 pack-year currently smoking OR have quit w/in 15years. Patient does not qualify. Breast:  Up to date on Mammogram? Yes   Up to date of Bone Density/Dexa? Yes Colorectal: Yes      Plan:   Reviewed health maintenance screenings with patient today and relevant education, vaccines, and/or referrals were provided.    Continue doing brain stimulating activities (puzzles, reading, adult coloring books, staying active) to keep memory sharp.    Continue to eat heart healthy diet (full of fruits, vegetables, whole grains, lean protein, water--limit salt, fat, and sugar intake) and increase physical activity as tolerated. I have personally reviewed and noted the following in the patient's chart:   . Medical and social history . Use of alcohol, tobacco or illicit drugs  . Current medications and supplements . Functional ability and status . Nutritional status . Physical activity . Advanced directives . List of other physicians . Hospitalizations, surgeries, and ER visits in previous 12  months . Vitals . Screenings to include cognitive, depression, and falls . Referrals and appointments  In addition, I have reviewed and discussed with patient certain preventive protocols, quality metrics, and best practice recommendations. A written personalized care plan for preventive services as well as general preventive health recommendations were provided to patient.     Sheral Flow, LPN  [id number]  Nurse Health Advisor    Medical screening examination/treatment/procedure(s) were performed by non-physician practitioner and as supervising physician I was immediately available for consultation/collaboration. I agree with above. Lew Dawes, MD

## 2020-03-27 NOTE — Assessment & Plan Note (Signed)
On B12 

## 2020-03-27 NOTE — Assessment & Plan Note (Signed)

## 2020-03-27 NOTE — Assessment & Plan Note (Signed)
Norvasc, Olmesartan

## 2020-03-27 NOTE — Assessment & Plan Note (Addendum)
MCD Discussed Neurol ref

## 2020-03-27 NOTE — Assessment & Plan Note (Signed)
Metformin 

## 2020-03-27 NOTE — Progress Notes (Signed)
Subjective:  Patient ID: Claudia [name], female    DOB: [birth year]  Age: 74 y.o. MRN: [id number]  CC: No chief complaint on file.   HPI Bobbijo B Reaume presents for a well exam F/u  B12 def, anxiety, HTN C/o stress, memory loss   Outpatient Medications Prior to Visit  Medication Sig Dispense Refill  . amLODipine (NORVASC) 2.5 MG tablet TAKE 1 TABLET EVERY DAY 90 tablet 3  . cholecalciferol (VITAMIN D) 1000 units tablet Take 1 tablet by mouth daily.    . cyanocobalamin (,VITAMIN B-12,) 1000 MCG/ML injection Inject 1 mL (1,000 mcg total) into the skin every 14 (fourteen) days. 10 mL 6  . DULoxetine (CYMBALTA) 60 MG capsule TAKE 1 CAPSULE EVERY DAY FOR DEPRESSION 90 capsule 3  . gabapentin (NEURONTIN) 600 MG tablet TAKE 1 TABLET THREE TIMES DAILY 270 tablet 1  . memantine (NAMENDA) 10 MG tablet Take 1 tablet (10 mg total) by mouth 2 (two) times daily. 60 tablet 11  . metFORMIN (GLUCOPHAGE) 500 MG tablet TAKE 1 TABLET EVERY DAY WITH BREAKFAST 90 tablet 3  . mirabegron ER (MYRBETRIQ) 50 MG TB24 tablet Take 1 tablet (50 mg total) by mouth daily. 30 tablet 11  . olmesartan (BENICAR) 20 MG tablet TAKE 1 TABLET EVERY DAY 90 tablet 3  . omeprazole (PRILOSEC) 40 MG capsule TAKE 1 CAPSULE TWICE DAILY 180 capsule 3  . SYRINGE-NEEDLE, DISP, 3 ML (BD ECLIPSE SYRINGE) 25G X 1" 3 ML MISC As directed SQ for B12 shots 50 each 3   No facility-administered medications prior to visit.    ROS: Review of Systems  Constitutional: Negative for activity change, appetite change, chills, fatigue and unexpected weight change.  HENT: Negative for congestion, mouth sores and sinus pressure.   Eyes: Negative for visual disturbance.  Respiratory: Negative for cough and chest tightness.   Gastrointestinal: Negative for abdominal pain and nausea.  Genitourinary: Negative for difficulty urinating, frequency and vaginal pain.  Musculoskeletal: Positive for arthralgias, back pain and gait problem.  Skin: Negative  for pallor and rash.  Neurological: Negative for dizziness, tremors, weakness, numbness and headaches.  Psychiatric/Behavioral: Positive for decreased concentration. Negative for confusion, sleep disturbance and suicidal ideas. The patient is nervous/anxious.     Objective:  BP 130/70 (BP Location: Left Arm, Patient Position: Sitting, Cuff Size: Normal)   Pulse 66   Temp 97.9 F (36.6 C) (Oral)   Ht 5\' 6"  (1.676 m)   Wt 180 lb (81.6 kg)   SpO2 98%   BMI 29.05 kg/m   BP Readings from Last 3 Encounters:  03/27/20 130/70  03/27/20 130/70  12/29/19 126/78    Wt Readings from Last 3 Encounters:  03/27/20 180 lb (81.6 kg)  03/27/20 180 lb 12.8 oz (82 kg)  01/26/20 179 lb (81.2 kg)    Physical Exam Constitutional:      General: She is not in acute distress.    Appearance: She is well-developed.  HENT:     Head: Normocephalic.     Right Ear: External ear normal.     Left Ear: External ear normal.     Nose: Nose normal.  Eyes:     General:        Right eye: No discharge.        Left eye: No discharge.     Conjunctiva/sclera: Conjunctivae normal.     Pupils: Pupils are equal, round, and reactive to light.  Neck:     Thyroid: No thyromegaly.  Vascular: No JVD.     Trachea: No tracheal deviation.  Cardiovascular:     Rate and Rhythm: Normal rate and regular rhythm.     Heart sounds: Normal heart sounds.  Pulmonary:     Effort: No respiratory distress.     Breath sounds: No stridor. No wheezing.  Abdominal:     General: Bowel sounds are normal. There is no distension.     Palpations: Abdomen is soft. There is no mass.     Tenderness: There is no abdominal tenderness. There is no guarding or rebound.  Musculoskeletal:        General: Tenderness present.     Cervical back: Normal range of motion and neck supple.  Lymphadenopathy:     Cervical: No cervical adenopathy.  Skin:    Findings: No erythema or rash.  Neurological:     Mental Status: She is oriented to  person, place, and time.     Cranial Nerves: No cranial nerve deficit.     Motor: No abnormal muscle tone.     Coordination: Coordination normal.     Deep Tendon Reflexes: Reflexes normal.  Psychiatric:        Behavior: Behavior normal.        Thought Content: Thought content normal.        Judgment: Judgment normal.    A/o/c   Lab Results  Component Value Date   WBC 5.2 09/21/2019   HGB 14.1 09/21/2019   HCT 42.3 09/21/2019   PLT 295.0 09/21/2019   GLUCOSE 125 (H) 09/21/2019   CHOL 208 (H) 06/17/2019   TRIG 95.0 06/17/2019   HDL 48.20 06/17/2019   LDLCALC 141 (H) 06/17/2019   ALT 13 06/17/2019   AST 20 06/17/2019   NA 140 09/21/2019   K 4.3 09/21/2019   CL 105 09/21/2019   CREATININE 1.10 09/21/2019   BUN 22 09/21/2019   CO2 28 09/21/2019   TSH 3.17 09/21/2019   INR 1.02 05/26/2015   HGBA1C 5.8 09/21/2019    DG Shoulder Right  Result Date: 10/29/2019 CLINICAL DATA:  Fall last night decreased range of motion EXAM: RIGHT SHOULDER - 2+ VIEW COMPARISON:  None FINDINGS: Humeral head is located. No signs of proximal humeral fracture. Irregularity along the inferior surface of the glenoid. Acromioclavicular degenerative changes. Subacromial spurring. IMPRESSION: Irregularity along the inferior surface of the glenoid could represent an avulsion injury or sequela of degenerative change. No signs of proximal humeral fracture or dislocation. Electronically Signed   By: Zetta Bills M.D.   On: 10/29/2019 15:34   DG Wrist Complete Left  Result Date: 10/29/2019 CLINICAL DATA:  Fall last night swelling and pain decreased range of motion. EXAM: LEFT WRIST - COMPLETE 3+ VIEW COMPARISON:  None FINDINGS: Minimally displaced fracture of the distal pole the scaphoid, along the lateral margin. Suggestion correlate along the medial aspect of the scaphoid as well, degenerative changes at the first carpometacarpal and second carpometacarpal joints as well as with respect to scaphoid and  trapezium as well as scaphoid and trapezoid articulation. Concomitant fractures of the distal carpal row be difficult to exclude as there is surrounding degenerative change. There may be a nondisplaced fracture of the trapezium best seen on the oblique view. Distal radius is intact. The distal radioulnar joint is normal. IMPRESSION: 1. Minimally displaced fracture of the distal pole of the scaphoid. 2. Suggestion of nondisplaced fracture of the trapezium. 3. Degenerative changes at the first carpometacarpal and second carpometacarpal joints as well as at the  second carpometacarpal articulation. 4. Osteopenia and concomitant degenerative changes limit assessment for additional fractures. Electronically Signed   By: Zetta Bills M.D.   On: 10/29/2019 15:31   CT Head Wo Contrast  Result Date: 10/29/2019 CLINICAL DATA:  Facial trauma last night. Abrasion to right cheek. Confusion. EXAM: CT HEAD WITHOUT CONTRAST TECHNIQUE: Contiguous axial images were obtained from the base of the skull through the vertex without intravenous contrast. COMPARISON:  January 08, 2016 FINDINGS: Brain: No subdural, epidural, or subarachnoid hemorrhage. Cerebellum, brainstem, and basal cisterns are normal. Ventricles and sulci are unremarkable. No mass effect or midline shift. No acute cortical ischemia or infarct. Vascular: No hyperdense vessel or unexpected calcification. Skull: Normal. Negative for fracture or focal lesion. Sinuses/Orbits: No acute finding. 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 However, the comparison study was obtained with the cervical spine in extension. IMPRESSION: 1. 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 It was not seen on the scout view from the comparison head CT from May 15, 2017. However, the comparison film was obtained with the cervical spine in flexion. There is no  obvious prevertebral soft tissue thickening on the scout view. Recommend clinical correlation. If there is concern for cervical spine injury, recommend dedicated imaging. 2. No acute intracranial abnormalities. These results will be called to the ordering clinician or representative by the Radiologist Assistant, and communication documented in the PACS or zVision Dashboard. The findings are being called and documented immediately. Electronically Signed   By: Dorise Bullion III M.D   On: 10/29/2019 16:12    Assessment & Plan:    Walker Kehr, MD

## 2020-03-27 NOTE — Assessment & Plan Note (Signed)
Stress discussed Worried about her hoarding problem

## 2020-03-30 ENCOUNTER — Encounter: Payer: Self-pay | Admitting: Neurology

## 2020-03-30 ENCOUNTER — Telehealth: Payer: Self-pay | Admitting: Internal Medicine

## 2020-03-30 DIAGNOSIS — Z13820 Encounter for screening for osteoporosis: Secondary | ICD-10-CM

## 2020-04-03 NOTE — Telephone Encounter (Signed)
DEXA ordered.  

## 2020-04-06 ENCOUNTER — Telehealth: Payer: Self-pay

## 2020-04-06 NOTE — Telephone Encounter (Signed)
LVM to call back and schedule Bone Density.

## 2020-04-10 ENCOUNTER — Telehealth: Payer: Self-pay | Admitting: Internal Medicine

## 2020-04-10 NOTE — Telephone Encounter (Signed)
Pt informed of MD's advisement on 03/27/20 labs.

## 2020-04-10 NOTE — Telephone Encounter (Signed)
New Message:   Pt would like a call about her results. She states they told her spouse everything looked good but she has some additional questions. Please advise.

## 2020-04-12 ENCOUNTER — Other Ambulatory Visit: Payer: Self-pay | Admitting: Internal Medicine

## 2020-04-13 ENCOUNTER — Encounter: Payer: Self-pay | Admitting: Internal Medicine

## 2020-04-25 ENCOUNTER — Other Ambulatory Visit: Payer: Self-pay

## 2020-04-25 ENCOUNTER — Ambulatory Visit (INDEPENDENT_AMBULATORY_CARE_PROVIDER_SITE_OTHER)
Admission: RE | Admit: 2020-04-25 | Discharge: 2020-04-25 | Disposition: A | Discharge status: Home / Self Care | Payer: Medicare HMO | Source: Ambulatory Visit | Attending: Internal Medicine | Admitting: Internal Medicine

## 2020-04-25 DIAGNOSIS — Z13820 Encounter for screening for osteoporosis: Secondary | ICD-10-CM

## 2020-04-25 DIAGNOSIS — M8580 Other specified disorders of bone density and structure, unspecified site: Secondary | ICD-10-CM

## 2020-05-15 ENCOUNTER — Ambulatory Visit: Payer: Medicare HMO | Admitting: Neurology

## 2020-05-15 ENCOUNTER — Encounter: Payer: Self-pay | Admitting: Neurology

## 2020-05-15 ENCOUNTER — Other Ambulatory Visit: Payer: Self-pay

## 2020-05-15 VITALS — BP 123/77 | HR 132 | Resp 18 | Ht 65.0 in | Wt 177.0 lb

## 2020-05-15 DIAGNOSIS — Z853 Personal history of malignant neoplasm of breast: Secondary | ICD-10-CM | POA: Diagnosis not present

## 2020-05-15 DIAGNOSIS — R413 Other amnesia: Secondary | ICD-10-CM

## 2020-05-15 DIAGNOSIS — R296 Repeated falls: Secondary | ICD-10-CM | POA: Diagnosis not present

## 2020-05-15 NOTE — Patient Instructions (Addendum)
1. Schedule MRI brain with and without contrast  2. Schedule Neurocognitive testing  3. Check your medications if you are taking the Memantine (Namenda)  4. Follow-up after tests, call for any changes   FALL PRECAUTIONS: Be cautious when walking. Scan the area for obstacles that may increase the risk of trips and falls. When getting up in the mornings, sit up at the edge of the bed for a few minutes before getting out of bed. Consider elevating the bed at the head end to avoid drop of blood pressure when getting up. Walk always in a well-lit room (use night lights in the walls). Avoid area rugs or power cords from appliances in the middle of the walkways. Use a walker or a cane if necessary and consider physical therapy for balance exercise. Get your eyesight checked regularly.  HOME SAFETY: Consider the safety of the kitchen when operating appliances like stoves, microwave oven, and blender. Consider having supervision and share cooking responsibilities until no longer able to participate in those. Accidents with firearms and other hazards in the house should be identified and addressed as well.  DRIVING: Regarding driving, in patients with progressive memory problems, driving will be impaired. We advise to have someone else do the driving if trouble finding directions or if minor accidents are reported. Independent driving assessment is available to determine safety of driving.  ABILITY TO BE LEFT ALONE: If patient is unable to contact 911 operator, consider using LifeLine, or when the need is there, arrange for someone to stay with patients. Smoking is a fire hazard, consider supervision or cessation. Risk of wandering should be assessed by caregiver and if detected at any point, supervision and safe proof recommendations should be instituted.  MEDICATION SUPERVISION: Inability to self-administer medication needs to be constantly addressed. Implement a mechanism to ensure safe administration of  the medications.   RECOMMENDATIONS FOR ALL PATIENTS WITH MEMORY PROBLEMS: 1. Continue to exercise (Recommend 30 minutes of walking everyday, or 3 hours every week) 2. Increase social interactions - continue going to [place] and enjoy social gatherings with friends and family 3. Eat healthy, avoid fried foods and eat more fruits and vegetables 4. Maintain adequate blood pressure, blood sugar, and blood cholesterol level. Reducing the risk of stroke and cardiovascular disease also helps promoting better memory. 5. Avoid stressful situations. Live a simple life and avoid aggravations. Organize your time and prepare for the next day in anticipation. 6. Sleep well, avoid any interruptions of sleep and avoid any distractions in the bedroom that may interfere with adequate sleep quality 7. Avoid sugar, avoid sweets as there is a strong link between excessive sugar intake, diabetes, and cognitive impairment We discussed the Mediterranean diet, which has been shown to help patients reduce the risk of progressive memory disorders and reduces cardiovascular risk. This includes eating fish, eat fruits and green leafy vegetables, nuts like almonds and hazelnuts, walnuts, and also use olive oil. Avoid fast foods and fried foods as much as possible. Avoid sweets and sugar as sugar use has been linked to worsening of memory function.  There is always a concern of gradual progression of memory problems. If this is the case, then we may need to adjust level of care according to patient needs. Support, both to the patient and caregiver, should then be put into place.  We have sent a referral to [facility] for your MRI and they will call you directly to schedule your appointment. They are located at [place].  If you need to contact them directly please call [phone].
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
## 2020-05-18 ENCOUNTER — Other Ambulatory Visit: Payer: Self-pay

## 2020-05-18 ENCOUNTER — Ambulatory Visit (INDEPENDENT_AMBULATORY_CARE_PROVIDER_SITE_OTHER): Payer: Medicare HMO | Admitting: Internal Medicine

## 2020-05-18 ENCOUNTER — Encounter: Payer: Self-pay | Admitting: Internal Medicine

## 2020-05-18 ENCOUNTER — Ambulatory Visit: Payer: Medicare HMO | Admitting: Internal Medicine

## 2020-05-18 VITALS — BP 134/88 | HR 69 | Temp 97.8°F | Ht 65.0 in | Wt 171.0 lb

## 2020-05-18 DIAGNOSIS — M79605 Pain in left leg: Secondary | ICD-10-CM

## 2020-05-18 DIAGNOSIS — R413 Other amnesia: Secondary | ICD-10-CM | POA: Diagnosis not present

## 2020-05-18 DIAGNOSIS — N3281 Overactive bladder: Secondary | ICD-10-CM | POA: Diagnosis not present

## 2020-05-18 MED ORDER — SOLIFENACIN SUCCINATE 5 MG PO TABS: 5.0000 mg | ORAL_TABLET | Freq: Every day | ORAL | 0 refills | Status: DC

## 2020-05-18 MED ORDER — MEMANTINE HCL 10 MG PO TABS: 10.0000 mg | ORAL_TABLET | Freq: Two times a day (BID) | ORAL | 1 refills | Status: DC

## 2020-05-18 NOTE — Assessment & Plan Note (Signed)
Rx vesicare and advised to follow up with PCP in 1-2 months to assess response to treatment.

## 2020-05-18 NOTE — Assessment & Plan Note (Signed)
Rx for namenda advised of titration. Advised to continue to see neurology and follow up with PCP.

## 2020-05-18 NOTE — Progress Notes (Signed)
Subjective:   Patient ID: Claudia [name], female    DOB: May 22, 1946, 74 y.o.   MRN: [id number]  HPI The patient is a 74 YO female coming in for concerns about fall and injury Tuesday evening. She was at [employer] and slipped on some water on the floor. She did fall and hit arm and leg on cart on the way down. Denies LOC or hitting head. Did have small skin scrape on leg and arm. Was able to get up and finish shopping after a few minutes. She did not feel she had serious injury and did not seek care. She was advised by family to come get checked out. Some aches and pains. No new headaches or vision changes. No pain on movement and is able to walk normally. Wound on the leg is scabbed over and no signs of infection. She also has concerns about memory (namenda on list and aricept listed as allergy, she does not recall ever getting or taking namenda, recent visit with neurology and getting MRI brain and neuropsych evaluation in the near future, feels memory is very bad) and bone density (curious about recent results of bone density in June 2021, prior fracture in the last year with wrist due to fall Dec 2020) and bladder (does not remember trying anything for this previously, has myrbetriq on her list but she denies ever trying, cannot make it to bathroom, has to use depends, would like to try what her sister is taking vesicare as it works for her)  Review of Systems  Constitutional: Negative.  Negative for appetite change, chills, fatigue, fever and unexpected weight change.  HENT: Negative.   Eyes: Negative.   Respiratory: Negative.  Negative for cough, chest tightness and shortness of breath.   Cardiovascular: Negative.  Negative for chest pain, palpitations and leg swelling.  Gastrointestinal: Negative.  Negative for abdominal distention, abdominal pain, constipation, diarrhea, nausea and vomiting.  Genitourinary: Positive for frequency and urgency.       Incontinence urine    Musculoskeletal: Positive for arthralgias, back pain and myalgias. Negative for gait problem and joint swelling.  Skin: Negative.   Neurological: Negative.        Memory change/loss  Psychiatric/Behavioral: Negative.     Objective:  Physical Exam Constitutional:      Appearance: She is well-developed.  HENT:     Head: Normocephalic and atraumatic.     Comments: No bruising or lesions on the face, scalp to suggest injury Cardiovascular:     Rate and Rhythm: Normal rate and regular rhythm.  Pulmonary:     Effort: Pulmonary effort is normal. No respiratory distress.     Breath sounds: Normal breath sounds. No wheezing or rales.  Abdominal:     General: Bowel sounds are normal. There is no distension.     Palpations: Abdomen is soft.     Tenderness: There is no abdominal tenderness. There is no rebound.  Musculoskeletal:     Cervical back: Normal range of motion.     Comments: 1 mm circular scab on the left shin without surrounding erythema or pain, no purulent discharge, no obvious bruising on body  Skin:    General: Skin is warm and dry.  Neurological:     Mental Status: She is alert and oriented to person, place, and time.     Coordination: Coordination normal.  Psychiatric:     Comments: Poor concentration during exam, remote recall to Dec 2020 seemed impaired, per patient this is  not new     Vitals:   05/18/20 0822  BP: 134/88  Pulse: 69  Temp: 97.8 F (36.6 C)  TempSrc: Oral  SpO2: 95%  Weight: 171 lb (77.6 kg)  Height: 5\' 5"  (1.651 m)    This visit occurred during the SARS-CoV-2 public health emergency.  Safety protocols were in place, including screening questions prior to the visit, additional usage of staff PPE, and extensive cleaning of exam room while observing appropriate contact time as indicated for disinfecting solutions.   Assessment & Plan:

## 2020-05-18 NOTE — Assessment & Plan Note (Signed)
Due to recent fall. No evidence for fracture on exam. Reviewed results on recent bone density which shows no evidence for osteopenia or osteoporosis. Advised to take calcium and vitamin D daily. Can use tylenol for pain. No imaging required today.

## 2020-05-18 NOTE — Patient Instructions (Addendum)
We have sent in the namenda (memantidine) to take 1 pill daily for 2 weeks. If doing well you can increase to 1 pill twice a day.   See Dr. Alain Marion in 1-2 months to check in with him.   The bones are normal strength on the recent bone density.   We have sent in the vesicare for the bladder to take 1 pill daily.

## 2020-06-05 ENCOUNTER — Other Ambulatory Visit: Payer: Self-pay | Admitting: Internal Medicine

## 2020-06-09 ENCOUNTER — Other Ambulatory Visit: Payer: Self-pay

## 2020-06-09 ENCOUNTER — Ambulatory Visit
Admission: RE | Admit: 2020-06-09 | Discharge: 2020-06-09 | Disposition: A | Discharge status: Home / Self Care | Payer: Medicare HMO | Source: Ambulatory Visit | Attending: Neurology | Admitting: Neurology

## 2020-06-09 DIAGNOSIS — M4312 Spondylolisthesis, cervical region: Secondary | ICD-10-CM | POA: Diagnosis not present

## 2020-06-09 DIAGNOSIS — M47812 Spondylosis without myelopathy or radiculopathy, cervical region: Secondary | ICD-10-CM | POA: Diagnosis not present

## 2020-06-09 DIAGNOSIS — I6782 Cerebral ischemia: Secondary | ICD-10-CM | POA: Diagnosis not present

## 2020-06-09 DIAGNOSIS — R413 Other amnesia: Secondary | ICD-10-CM

## 2020-06-09 DIAGNOSIS — Z853 Personal history of malignant neoplasm of breast: Secondary | ICD-10-CM

## 2020-06-09 DIAGNOSIS — R296 Repeated falls: Secondary | ICD-10-CM | POA: Diagnosis not present

## 2020-06-09 MED ORDER — GADOBENATE DIMEGLUMINE 529 MG/ML IV SOLN
15.0000 mL | Freq: Once | INTRAVENOUS | Status: AC | PRN
  Administered 2020-06-09: 15 mL via INTRAVENOUS

## 2020-06-12 ENCOUNTER — Telehealth: Payer: Self-pay

## 2020-06-12 NOTE — Telephone Encounter (Signed)
Pt called no answer voice mail left for pt to call back 

## 2020-06-12 NOTE — Telephone Encounter (Signed)
-----   Message from Cameron Sprang, MD sent at 06/12/2020  8:48 AM EDT ----- Pls let her know MRI brain looked good, no evidence of tumor, stroke, or bleed. It showed mild hardening of the small blood vessels in the brain, commonly seen in patients with blood pressure, cholesterol, and diabetes issues. Continue control of these conditions. Proceed with memory testing scheduled this week, thanks

## 2020-06-13 ENCOUNTER — Telehealth: Payer: Self-pay

## 2020-06-13 NOTE — Telephone Encounter (Signed)
Pt called and informed that MRI brain looked good, no evidence of tumor, stroke, or bleed. It showed mild hardening of the small blood vessels in the brain, commonly seen in patients with blood pressure, cholesterol, and diabetes issues. Continue control of these conditions. Proceed with memory testing scheduled this week which is scheduled for tomorrow 06/14/2020 @8 :30

## 2020-06-13 NOTE — Telephone Encounter (Signed)
-----   Message from Cameron Sprang, MD sent at 06/12/2020  8:48 AM EDT ----- Pls let her know MRI brain looked good, no evidence of tumor, stroke, or bleed. It showed mild hardening of the small blood vessels in the brain, commonly seen in patients with blood pressure, cholesterol, and diabetes issues. Continue control of these conditions. Proceed with memory testing scheduled this week, thanks

## 2020-06-14 ENCOUNTER — Ambulatory Visit (INDEPENDENT_AMBULATORY_CARE_PROVIDER_SITE_OTHER): Payer: Medicare HMO | Admitting: Counselor

## 2020-06-14 ENCOUNTER — Encounter: Payer: Self-pay | Admitting: Counselor

## 2020-06-14 ENCOUNTER — Ambulatory Visit: Payer: Medicare HMO | Admitting: Psychology

## 2020-06-14 ENCOUNTER — Other Ambulatory Visit: Payer: Self-pay

## 2020-06-14 DIAGNOSIS — F329 Major depressive disorder, single episode, unspecified: Secondary | ICD-10-CM | POA: Diagnosis not present

## 2020-06-14 DIAGNOSIS — F32A Depression, unspecified: Secondary | ICD-10-CM

## 2020-06-14 DIAGNOSIS — F09 Unspecified mental disorder due to known physiological condition: Secondary | ICD-10-CM

## 2020-06-14 DIAGNOSIS — Z630 Problems in relationship with spouse or partner: Secondary | ICD-10-CM

## 2020-06-14 DIAGNOSIS — G3184 Mild cognitive impairment, so stated: Secondary | ICD-10-CM

## 2020-06-14 NOTE — Progress Notes (Signed)
[facility]  Patient Name: ALAYCIA EARDLEY MRN: [id number] Date of Birth: July 28, 1946 Age: 74 y.o. Education: 12 years  Referral Circumstances and Background Information  Ms. Kolbi Mothershead is a 74 y.o., right-hand dominant, married woman with a history of DMII, HTN, depression, hyperlipidemia, and RLS and memory problems. She was seen recently by Dr. Delice Lesch related to her memory loss and obtained a SLUMS 15/30 at that visit. She is a patient of Dr. Judeen Hammans.    On interview, the patient reported that she has a hard time remembering names and getting her words out. Her daughter also says that she forgets things, although the patient stated that she is the one who is the most worried about her problems. She has noticed these problems over about the past year and thinks that they are worsening over time. On specific review of symptoms, she denied having problems with orientation, she denied having problems with decision making or problem solving. It is mainly short term memory. She does misplace things and can't recall where they are, which is worse over the past year. She has lost important things like her credit card, "at least every month." With respect to movement, the patient has been falling occasionally (one time she slipped on water, one time she wasn't watching where she was going when she was walking), and she broke her toe and hand after the time she fell when she was walking. She largely denied unsteadiness, tremors, or moving very slowly. With respect to mood, the patient said she is "not great," and she is upset about the extent to which her daughter and husband are overbearing (in her opinion). She gets upset and tearful about this typically once a week. She does feel helpless and hopeless sometimes. Her energy is low and she has minimal motivation. She is also having sleeping problems, typically 3 or 4 nights a week she will not sleep throughout the  night, but she had a hard time estimating how much sleep she is getting. She will often get up around 3:00am and can't go back to sleep. She reported that her weight is stable and her appetite is good.   With respect to functioning, Ms. Loomer reported that she is still driving and isn't getting lost or having accidents, although she is less confident about directions than she was in the past. She is managing her own medications and presented as worried that she forgets them occasionally, although she is only forgetting them about once a month. The patient used to do the finances, although her husband took them over, but she said that's because he is controlling and not related to problems with her financial management. The patient is still cooking and doesn't have a problem following recipes, although she sated that she doesn't upkeep her house like she used to related to motivation. She stated that she has no hobbies and wasn't really clear how she is spending her time day to day. She is independent with respect to community utilization although she doesn't go out much. She was going to the senior center, she stopped because of COVID, and she is thinking about going back but hasn't. She has a fair amount of procrastination, it sounds like, she is trying to clean out her garage and has a hard time getting it done.   Past Medical History and Review of Relevant Studies   Patient Active Problem List   Diagnosis Date Noted  . Osteopenia 12/22/2019  . Wrist fracture,  closed 12/22/2019  . Scaphoid fracture, wrist, closed 11/02/2019  . Toe contusion 11/02/2019  . Facial trauma, initial encounter 10/29/2019  . Confusion 10/29/2019  . Left wrist pain 10/29/2019  . Ingrowing nail, left great toe 09/21/2019  . Memory loss 09/21/2019  . Occipital neuralgia of right side 06/17/2019  . Foot pain, bilateral 01/07/2019  . Contusion of hip 10/07/2018  . Right shoulder pain 10/07/2018  . CRI (chronic renal  insufficiency), stage 3 (moderate) 07/01/2018  . Dog scratch 05/12/2018  . Sinusitis, chronic 12/02/2017  . Paresthesia 11/06/2017  . Skin rash 08/07/2017  . Headache 05/13/2017  . OAB (overactive bladder) 05/14/2016  . Concussion with loss of consciousness 01/05/2016  . Neoplasm of uncertain behavior of skin 01/05/2016  . Acute upper respiratory infection 09/12/2015  . Diabetes type 2, controlled ([place]) 09/12/2015  . 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 ([place]) 06/01/2015  . Diastolic dysfunction without heart failure 05/14/2015  . Preop exam for internal medicine 05/10/2015  . Hamstring tendonitis of right thigh 01/27/2015  . Forgetfulness 11/02/2014  . Pes planus of both feet 08/08/2014  . Avulsion fracture of lateral malleolus 06/07/2014  . Sprain of ankle, unspecified site 02/01/2014  . Tibialis posterior tendinitis 12/03/2013  . Ankle pain, right 09/14/2013  . Hyperglycemia 09/14/2013  . Leg pain 04/29/2012  . Knee pain 04/29/2012  . Well adult exam 10/23/2011  . 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 Weight gain 04/10/2011  . LIPOMA 10/17/2010  . HEMATURIA UNSPECIFIED 08/27/2010  . Abdominal pain 07/16/2010  . WEIGHT LOSS 05/09/2010  . FREQUENCY, URINARY 05/09/2010  . ABDOMINAL PAIN, EPIGASTRIC 05/09/2010  . VERTIGO 02/06/2010  . NECK PAIN 03/28/2009  . GALLSTONES 12/07/2008  . HEMORRHOIDS, INTERNAL 11/23/2008  . ESOPHAGEAL STRICTURE 11/23/2008  . HIATAL HERNIA 11/23/2008  . IBS 11/23/2008  . Upper respiratory infection 11/01/2008  . Chest pain, unspecified 11/01/2008  . Pernicious anemia 08/16/2008  . Chronic fatigue 08/16/2008  . Anxiety state 11/25/2007  . Depression 11/25/2007  . Osteoarthritis 11/25/2007  . CRAMPS,LEG 11/25/2007  . BREAST CANCER, HX OF 11/25/2007  . RESTLESS LEG SYNDROME 07/23/2007  . Essential hypertension 07/23/2007  . Fibromyalgia 07/23/2007  . NEURALGIA 07/23/2007  . INSOMNIA 07/23/2007   Review of Neuroimaging and Relevant  Medical History: The patient has an MRI brain from 06/09/2020 that shows mild, generalized, cerebral and cerebellar volume loss. 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 The imaging overall is nonspecific and non diagnostic.   Current Outpatient Medications  Medication Sig Dispense Refill  . amLODipine (NORVASC) 2.5 MG tablet TAKE 1 TABLET EVERY DAY 90 tablet 3  . cholecalciferol (VITAMIN D) 1000 units tablet Take 1 tablet by mouth daily.    . cyanocobalamin (,VITAMIN B-12,) 1000 MCG/ML injection Inject 1 mL (1,000 mcg total) into the skin every 14 (fourteen) days. 10 mL 6  . DULoxetine (CYMBALTA) 60 MG capsule TAKE 1 CAPSULE EVERY DAY FOR DEPRESSION 90 capsule 3  . gabapentin (NEURONTIN) 600 MG tablet TAKE 1 TABLET THREE TIMES DAILY 270 tablet 1  . memantine (NAMENDA) 10 MG tablet Take 1 tablet (10 mg total) by mouth 2 (two) times daily. 180 tablet 1  . metFORMIN (GLUCOPHAGE) 500 MG tablet TAKE 1 TABLET EVERY DAY WITH BREAKFAST 90 tablet 3  . olmesartan (BENICAR) 20 MG tablet TAKE 1 TABLET EVERY DAY 90 tablet 3  . omeprazole (PRILOSEC) 40 MG capsule TAKE 1 CAPSULE TWICE  DAILY 180 capsule 3  . solifenacin (VESICARE) 5 MG tablet Take 1 tablet (5 mg total) by mouth daily. 90 tablet 0  . SYRINGE-NEEDLE, DISP, 3 ML (BD ECLIPSE SYRINGE) 25G X 1" 3 ML MISC As directed SQ for B12 shots 50 each 3   No current facility-administered medications for this visit.   Family History  Problem Relation Age of Onset  . Diabetes Mother   . Kidney disease Mother   . Lymphoma Mother   . Diabetes Sister   . Heart disease Sister   . Stroke Sister   . Diabetes Brother   . Kidney disease Brother   . Coronary artery disease Brother   . Breast cancer Paternal Grandmother   . Coronary artery disease Maternal Grandmother    There is a family history of dementia. She is one of four children and her  sister had dementia and died of it. She developed the condition in her mid 22s and it sounds like that was quite impactful for the patient. She denied any other family history of dementia. There is no  family history of psychiatric illness.  Psychosocial History  Developmental, Educational and Employment History: The patient is a native of [place] but grew up in [place]. She had a hard childhood, her father died when she was young, and she reported that she needed to take care of herself and was working when she was 74 years old. She helped her mother care for her brothers and sisters. She reported that she was a good student who was never held back, didn't have any learning difficulties, and got good grades. 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 She retired at 21.   Psychiatric History: The patient reported that she started having issues with depression around the time when she retired, which she attributed to her husband, because she was seeing him more. She has mostly gotten medications from her PCP and thinks she saw a psychiatrist many years ago but has minimal formal treatment involvement beyond that. She is currently on cymbalta, which she has been taking "for years."   Substance Use History: The patient denied any history of drinking, tobacco use, or drug use.   Relationship History and Living Cimcumstances: The patient has been married to her husband for around 81 years. It sounds like they are quite unhappy and have thought about separating. They have one daughter.   Mental Status and Behavioral Observations  Sensorium/Arousal: The patient's level of arousal was awake and alert. Hearing and vision were adequate for testing purposes. Orientation: The patient was alert and fully oriented but had to be prompted to provide the correct years (said 2020, then 2022, then realized it  was 2021). Aware of current and past president.  Appearance: Dressed in appropriate, casual clothing with reasonable grooming and hygiene Behavior: Somewhat impulsive and presented as a bit rushed throughout the appointment Speech/language: Normal in rate, rhythm, volume, and prosody Gait/Posture: Not formally examined Movement: No overt signs/symptoms of movement disorder such as tremor, bradykinesia, hypokinesia Social Comportment: Appropriate Mood: "Not too good" Affect: Dysphoric with undercurrent of irritability Thought process/content: Thought process was logical, linear, and goal directed, although she seemed to rush at times and get herself off track. Her thought content was appropriate and she presented as a reasonable historian Safety: No thoughts of harming self or others endorsed on direct questioning Insight: Control and instrumentation engineer  Assessment  06/14/2020  Visuospatial/ Executive (0/5) 3  Naming (0/3) 3  Attention: Read list of digits (0/2) 2  Attention: Read list of letters (0/1) 1  Attention: Serial 7 subtraction starting at 100 (0/3) 3  Language: Repeat phrase (0/2) 0  Language : Fluency (0/1) 0  Abstraction (0/2) 2  Delayed Recall (0/5) 3  Orientation (0/6) 6  Total 23  Adjusted Score (based on education) 24   Test Procedures  Wide Range Achievement Test - 4             Word Reading Neuropsychological Assessment Battery  List Learning  Story Learning  Daily Living Memory  Naming  Digit Span Repeatable Battery for the Assessment of Neuropsychological Status (Form A)  Figure Copy  Judgment of Line Orientation  Coding  Figure Recall The Dot Counting Test A Random Letter Test Controlled Oral Word Association (F-A-S) Semantic Fluency (Animals) Trail Making Test A & B Complex Ideational Material Modified [place] Card Sorting Test Geriatric Depression Scale - Short Form  Plan  Sonya B Mchugh was seen for a psychiatric diagnostic evaluation and  neuropsychological testing. She is a 74 year old, right-hand dominant, married woman with a history of depression/anxiety, DMII, HLD, HTN, and memory and thinking problems over about the past year. She performed in the normal to mild cognitive impairment range on screening today. She initially stated that she wasn't sure if she wanted to come back for follow up because she thinks after talking that her issues are not due to dementia and may be due to stress. I was candid with her that, by functioning, she does not have dementia but may have MCI and the testing will be helpful to figure that out. She wished to return for an in person appointment at her next visit, because she is particularly interested in going over her brain imaging. Full and complete note with impressions, recommendations, and interpretation of test data to follow.   Viviano Simas Nicole Kindred, PsyD, [facility] Clinical Neuropsychologist  Informed Consent and Coding/Compliance  Risks and benefits of the evaluation were discussed with the patient prior to all testing procedures. I conducted a clinical interview and neuropsychological testing (at least two tests) with Jordin B Eberlin and Milana Kidney, B.S. (Technician) assisted me in administering additional test procedures. The patient was able to tolerate the testing procedures and the patient (and/or family if applicable) is likely to benefit from further follow up to receive the diagnosis and treatment recommendations, which will be rendered at the next encounter. Billing below reflects technician time, my direct face-to-face time with the patient, time spent in test administration, and time spent in professional activities including but not limited to: neuropsychological test interpretation, integration of neuropsychological test data with clinical history, report preparation, treatment planning, care coordination, and review of diagnostically pertinent medical history or studies.   Services  associated with this encounter: Clinical Interview [phone]) plus 60 minutes (12248; Neuropsychological Evaluation by Professional)  110 minutes (25003; Neuropsychological Evaluation by Professional, Adl.) 23 minutes (70488; Test Administration by Professional) 30 minutes (89169; Neuropsychological Testing by Technician) 70 minutes (45038; Neuropsychological Testing by Technician, Adl.)

## 2020-06-14 NOTE — Progress Notes (Signed)
   Psychometrist Note   Cognitive testing was administered to [employer] by Milana Kidney, B.S. (Technician) under the supervision of Alphonzo Severance, Psy.D., ABN. Ms. Ruder was able to tolerate all test procedures. Dr. Nicole Kindred met with the patient as needed to manage any emotional reactions to the testing procedures. Rest breaks were offered.    The battery of tests administered was selected by Dr. Nicole Kindred with consideration to the patient's current level of functioning, the nature of her symptoms, emotional and behavioral responses during the interview, level of literacy, observed level of motivation/effort, and the nature of the referral question. This battery was communicated to the psychometrist. Communication between Dr. Nicole Kindred and the psychometrist was ongoing throughout the evaluation and Dr. Nicole Kindred was immediately accessible at all times. Dr. Nicole Kindred provided supervision to the technician on the date of this service, to the extent necessary to assure the quality of all services provided.    Ms. Kingsberry will return in approximately one week for an interactive feedback session with Dr. Nicole Kindred, at which time female test performance, clinical impressions, and treatment recommendations will be reviewed in detail. The patient understands she can contact our office should she require our assistance before this time.   A total of 100 minutes of billable time were spent with Pari B Hernandes by the technician, including test administration and scoring time. Billing for these services is reflected in Dr. Les Pou note.   This note reflects time spent with the psychometrician and does not include test scores, clinical history, or any interpretations made by Dr. Nicole Kindred. The full report will follow in a separate note.

## 2020-06-15 NOTE — Progress Notes (Signed)
NEUROPSYCHOLOGICAL TEST SCORES [facility]  Patient Name: Claudia [name] MRN: [id number] Date of Birth: 1946/03/30 Age: 74 y.o. Education: 12 years  Measurement properties of test scores: IQ, Index, and Standard Scores (SS): Mean = 100; Standard Deviation = 15 Scaled Scores (Ss): Mean = 10; Standard Deviation = 3 Z scores (Z): Mean = 0; Standard Deviation = 1 T scores (T); Mean = 50; Standard Deviation = 10  TEST SCORES:    Note: This summary of test scores accompanies the interpretive report and should not be interpreted by unqualified individuals or in isolation without reference to the report. Test scores are relative to age, gender, and educational history as available and appropriate.   Performance Validity        "A" Random Letter Test Raw  Descriptor      Errors 1 Within Expectation  The Dot Counting Test: 9 Within Expectation      Mental Status Screening     Total Score Descriptor  MoCA 24 Normal      Expected Functioning        Wide Range Achievement Test: Standard/Scaled Score Percentile      Word Reading 96 39      Attention/Processing Speed        Neuropsychological Assessment Battery (Attention Module, Form 1): T-score Percentile      Digits Forward 56 73      Digits Backwards 57 75      Repeatable Battery for the Assessment of Neuropsychological Status (Form A): Scaled Score Percentile      Coding 8 25      Language        Neuropsychological Assessment Battery (Language Module, Form 1): T-score Percentile      Naming   (30) 60 84      Verbal Fluency: T-score Percentile      Controlled Oral Word Association (F-A-S) 29 2      Semantic Fluency (Animals) 55 69      Memory:        Neuropsychological Assessment Battery (Memory Module, Form 1): T-score Percentile      List Learning           List A Immediate Recall   (4, 5, 8) 43 25         List B Immediate Recall   (3) 46 34         List A Short Delayed Recall   (6) 48 42         List A  Long Delayed Recall   (0) 30 2         List A Percent Retention   (0 %) --- <1         List A Long Delayed Yes/No Recognition Hits   (6) --- <1         List A Long Delayed Yes/No Recognition False Alarms   (3) --- 73         List A Recognition Discriminability Index --- 21     Story Learning           Immediate Recall   (10, 34) 37 9         Delayed Recall   (22) 38 12         Percent Retention   (65 %) --- 25      Daily Living Memory            Immediate Recall   (18, 13) 39 14  Delayed Recall   (6, 2) 36 8          Percent Retention (57 %) --- 2          Recognition Hits    (5) --- 1      Repeatable Battery for the Assessment of Neuropsychological Status (Form A): Scaled Score Percentile         Figure Recall   (3) 3 1      Visuospatial/Constructional Functioning        Repeatable Battery for the Assessment of Neuropsychological Status (Form A): Standard/Scaled Score Percentile     Visuospatial/Constructional Index 72 3         Figure Copy   (14) 4 2         Judgment of Line Orientation   (14) --- 17-25      Executive Functioning        Modified [employer] Test (MWCST): Standard/T-Score Percentile      Number of Categories Correct 21 <1      Number of Perseverative Errors 38 12      Number of Total Errors 30 2      Percent Perseverative Errors 47 38  Executive Function Composite 66 1      Trail Making Test: T-Score Percentile      Part A 43 25      Part B 43 25      [place] Diagnostic Aphasia Exam: Raw Score Scaled Score      Complex Ideational Material 12 12      Clock Drawing Raw Score Descriptor      Command 8 Borderline Impairment      Rating Scales         Raw Score Descriptor  Patient Health Questionnaire - 9 3 Within Normal Limits  GAD-7 3 Within Normal Limits      Clinical Dementia Rating Raw Score Descriptor      Sum of Boxes 1.5 Mild Cognitive Impairment      Global Score 0.5 MCI      Quick Dementia Rating System Raw Score  Descriptor      Sum of Boxes 0 Normal      Total Score 0 Normal  Geriatric Depression Scale - Short Form 6 Positive   Daimon Kean V. Roseanne Reno PsyD, ABN Clinical Neuropsychologist

## 2020-06-16 ENCOUNTER — Other Ambulatory Visit: Payer: Self-pay | Admitting: Internal Medicine

## 2020-06-17 ENCOUNTER — Other Ambulatory Visit: Payer: Self-pay | Admitting: Internal Medicine

## 2020-06-21 ENCOUNTER — Encounter: Payer: Medicare HMO | Admitting: Counselor

## 2020-06-22 ENCOUNTER — Encounter: Payer: Medicare HMO | Admitting: Counselor

## 2020-06-22 NOTE — Progress Notes (Signed)
[facility]  Patient Name: Claudia [name] MRN: [id number] Date of Birth: 06/28/1946 Age: 74 y.o. Education: 12 years  Referral Circumstances and Background Information  Ms. Claudia [name] is a 74 y.o., right-hand dominant, maried woman with a history of DMII, HTN, HLD, depression/anxiety who has been having memory problems over the past year. She thinks they are worsening over time and she has some minor day-to-day interference, such as forgetting names and problems getting words out. Her MRI shows some mild leukoaraiosis, perhaps enough to contribute to cognitive problems but not enough to cause a dementia, and generalized non-diagnostic volume loss that is not clearly pathological for her age.   Her performance on neuropsychological evaluation was broadly suggestive of executive dysfunction although low scores were generated in other domains including on measures of memory and visuospatial/constructional abilities. Memory performance was better for structured as compared to unstructured information and she is retaining some information across time. This could reflect a developing storage problem but may also be entirely due to executive interference. With regard to visuospatial abilities, she did well on a so-called "pure" perceptual measure but had difficulty with attention to detail on a constructional measure with additional task demands, suggesting that her executive problems undermined her performance. She screened positive for the presence of depression. Her CDR places her at a mild cognitive impairment level of function.    Ms. Claudia [name] is thus demonstrating a mild level of cognitive impairment with primarily executive dysfunction and that likely undermined her performance in other areas. Vascular factors and depression/anxiety issues may be entirely to blame but an underlying condition that has not declared itself yet is within the differential given her  demographics.   Diagnostic Impressions: Mild cognitive impairment Adjustment disorder with mixed anxiety and depressed mood   Marital dysfunction  Recommendations to be discussed with patient  Your performance and presentation were consistent with a mild level of cognitive difficulty, meaning that there were some low scores that are in excess of what I would expect for someone your age and your overall ability level. The pattern, however, is not entirely clear diagnostically. You did not have any specific low scores that I tend to expect in individuals with Alzheimer's disease. Rather, you had difficulties on measures of executive ability and I think that undermined your performance in memory and other areas. The possibility of a developing underlying condition such as Alzheimer's cannot be entirely excluded but I think that vascular changes in the brain and depression/anxiety are the most likely explanations. You certainly do not have dementia at this time, although you do have Mild Cognitive Impairment.   The major difference between mild cognitive impairment (MCI) and dementia is in severity and potential prognosis. Once someone reaches a level of severity adequate to be diagnosed with a dementia, there is usually progression over time, though this may be years. On the other hand, mild cognitive impairment, while a significant risk for dementia in future, does not always progress to dementia, and in some instances stays the same or can even revert to normal. It is important to realize that if MCI is due to underlying Alzheimer's disease, it will most likely progress to dementia eventually. The rate of conversion to Alzheimer's dementia from amnestic MCI is about 15% per year versus the general population risk of conversion of 2% per year.   Cerebrovascular disease is a major contributor to cognitive decline. Most people are familiar with stroke, which involves acute disruptions in blood flow to the  brain that result in damage to underlying brain tissue. Small vessel ischemic disease is less well known but is more common and can be equally problematic. Small vessel ischemic disease involves wear and tear on small blood vessels in the brain over time and increase with age, affecting nearly 100% of people in those older than 90 years. Small vessel ischemic disease increases risk for dementia and has been associated with Alzheimer's dementia in some studies. The best way to minimize cerebrovascular risk is to actively manage risk factors, including hypertension and high cholesterol. Medications can be helpful but maintaining a healthy body weight, getting regular exercise, and eating a heart-healthy diet such as the MIND diet or DASH diet are also crucially important.   Now is an excellent time to make healthy lifestyle changes that have been shown to be beneficial for cognitive longevity. These generally include diet, exercise, and other healthy lifestyle changes such as managing stress, cultivating satisfying social interactions and connectedness with others, and engaging in mentally stimulating activities.   There is now good quality evidence from at least one large scale study that a modified mediterranean diet may help slow cognitive decline. This is known as the "MIND" diet. The Mind diet is not so much a specific diet as it is a set of recommendations for things that you should and should not eat.   Foods that are ENCOURAGED on the MIND Diet:  Green, leafy vegetables: Aim for six or more servings per week. This includes kale, spinach, cooked greens and salads.  All other vegetables: Try to eat another vegetable in addition to the green leafy vegetables at least once a day. It is best to choose non-starchy vegetables because they have a lot of nutrients with a low number of calories.  Berries: Eat berries at least twice a week. There is a plethora of research on strawberries, and other berries such  as blueberries, raspberries and blackberries have also been found to have antioxidant and brain health benefits.  Nuts: Try to get five servings of nuts or more each week. The creators of the [place] don't specify what kind of nuts to consume, but it is probably best to vary the type of nuts you eat to obtain a variety of nutrients. Peanuts are a legume and do not fall into this category.  Olive oil: Use olive oil as your main cooking oil. There may be other heart-healthy alternatives such as algae oil, though there is not yet sufficient research upon which to base a formal recommendation.  Whole grains: Aim for at least three servings daily. Choose minimally processed grains like oatmeal, quinoa, brown rice, whole-wheat pasta and 100% whole-wheat bread.  Fish: Eat fish at least once a week. It is best to choose fatty fish like salmon, sardines, trout, tuna and mackerel for their high amounts of omega-3 fatty acids.  Beans: Include beans in at least four meals every week. This includes all beans, lentils and soybeans.  Poultry: Try to eat chicken or [place] at least twice a week. Note that fried chicken is not encouraged on the MIND diet.  Wine: Aim for no more than one glass of alcohol daily. Both red and white wine may benefit the brain. However, much research has focused on the red wine compound resveratrol, which may help protect against Alzheimer's disease.  Foods that are DISCOURAGED on the MIND Diet: Butter and margarine: Try to eat less than 1 tablespoon (about 14 grams) daily. Instead, try using olive oil as your  primary cooking fat, and dipping your bread in olive oil with herbs.  Cheese: The MIND diet recommends limiting your cheese consumption to less than once per week.  Red meat: Aim for no more than three servings each week. This includes all beef, pork, lamb and products made from these meats.  [place] food: The MIND diet highly discourages fried food, especially the kind from fast-food  restaurants. Limit your consumption to less than once per week.  Pastries and sweets: This includes most of the processed junk food and desserts you can think of. Ice cream, cookies, brownies, snack cakes, donuts, candy and more. Try to limit these to no more than four times a week.  Exercise is one of the best medicines for promoting health and maintaining cognitive fitness at all stages in life. Exercise probably has the largest documented effect on brain health and performance of any intervention. Studies have shown that even previously sedentary individuals who start exercising as late as age 43 show a significant survival benefit as compared to their non-exercising peers. In the [place], the current guidelines are for 30 minutes of moderate exercise per day, but increasing your activity level less than that may also be helpful. You do not have to get your 30 minutes of exercise in one shot and exercising for short periods of time spread throughout the day can be helpful. Go for several walks, learn to dance, or do something else you enjoy that gets your body moving. Of course, if you have an underlying medical condition or there is any question about whether it is safe for you to exercise, you should consult a medical treatment provider prior to beginning exercise.   Of course, it is just as important to address interfering conditions as it is to prevent additional conditions from developing. I think your level of relational dissatisfaction and the toll it is taking on your mental health is likely contributing to your cognitive difficulties. Therefore, I would suggest that you consider psychotherapy in addition to medication to work on some of those issues.   Test Findings  Test scores are summarized in additional documentation associated with this encounter. Test scores are relative to age, gender, and educational history as available and appropriate. There were no concerns about performance  validity as all findings fell within normal expectations.   General Intellectual Functioning/Achievement:  Performance on single word reading was average, which presents as a reasonable standard of comparison for the patient's cognitive test performance.   Attention and Processing Efficiency: Indicators of attention and working memory generated good scores with digit repetition forward and digit repetition backward hovering around the margin of the average and high average ranges.   Language: With respect to language functioning, visual object confrontation naming was high average. Timed generation of words was variable with extremely low phonemic fluency and average animal fluency.   Visuospatial Function: Performance on measures of visuospatial and constructional abilities fell at an unusually low level on the overall index; however, her performance was likely undermined by executive issues. Copy of a modestly complex figure was extremely low, although she had poor attention to detail and that impacted her score. The overall gestalt and figure details were reasonable, there were simply minor inaccuracies in reproduction. Performance on judgment of angular line orientations, a putative "pure" perceptual measures was low average to average.   Learning and Memory: Performance within this domain fell below expectations for an individual of average overall ability. Nevertheless, the profile is unclear as to whether  this represents a developing storage problem or simply executive abilities interfering with memory performance.   In the verbal realm, she learned 4, 5, and 8 words of a 12-item list across three learning trials, which is low average. She recalled 6 of these words on short delayed recall, which is average, but then did not recall any on long-delayed recall. She did better using a cued recognition format and scored at a low average level. Overall, this suggests that there is a retrieval  element to her memory problems. Memory for structured information was better with low average immediate and delayed recall scores when learning a short story and reasonable average range retention of information across time. Memory for brief daily-living type information was low average although in this case, she did not retain the information as well and generated an unusually low score on delayed recall.   In the visual realm, delayed recall of a modestly complex figure stimulus was extremely low.   Executive Functions: Performance on executive measures was variable with an overall impression of some dysfunction given her performance across the test battery and low scores on measures viewed as primary to this domain. She performed at an extremely low level on the Modified [employer] Composite with an extremely low categories correct score and weak low average perseverative errors score. Generation of words in response to the letters F-A-S was extremely low. Alternating sequencing of numbers and letters of the alphabet was better, falling at a low average level. Reasoning with verbal information was average on the Complex [employer]ational Material. Clock drawing was "borderline" with missing numbers yet otherwise correct features.   Rating Scale(s): Claudia [name] screened positive for the presence of depression on a self-report rating inventory of symptoms. Although the evaluation did not benefit from an informant, I was able to rate a CDR for her using all available information and her Sum of Boxes score is 1.5, her global score is 0.5, which is mild cognitive impairment.   Claudia [name] Claudia Kindred PsyD, [place] [facility]ologist

## 2020-06-26 ENCOUNTER — Telehealth: Payer: Self-pay

## 2020-06-26 NOTE — Telephone Encounter (Signed)
I called and spoke to patient. Overall, she is scared that she has Alzeimers. She said that her sister died around the age she is now from Alzeimers. 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 Patient stated the whole reason she wanted the MRI was to see if her brain was shrinking like her sisters did. Patient also informed me that she falls all the time and hits her head on the right side. She says the right side of her head always hurts and "is hurting right now as we speak." She said that the pain goes down towards her neck. She also said that it hurts so bad "the pain comes out my eyeballs and out through my nose." Overall, patient is scared that she might have Alzeimers and wants to know if her brain is shrinking.Patient will schedule a follow up once she hears what Dr. Delice Lesch has to say.Patient also stated that she was upset that Dr. Nicole Kindred made her appt a video appt. She said "How is he supposed to show me my results over the phone."

## 2020-06-27 NOTE — Telephone Encounter (Signed)
Add to wait list.

## 2020-06-29 ENCOUNTER — Telehealth: Payer: Self-pay

## 2020-06-29 NOTE — Telephone Encounter (Signed)
New message    The patient is out of her blood pressure medication does not know the name of medication name   [place], [place]

## 2020-06-29 NOTE — Telephone Encounter (Signed)
New Message:   Pt is calling back to check on the status of her medication refills. Pt states she has been without this medication for 2 weeks now. Please advise.

## 2020-06-30 NOTE — Telephone Encounter (Signed)
Called pt and informed her that blood pressure medications were sent in to [facility] on 06/19/2020 and to follow up with her pharmacy

## 2020-07-18 ENCOUNTER — Encounter: Payer: Self-pay | Admitting: Neurology

## 2020-07-18 ENCOUNTER — Ambulatory Visit (INDEPENDENT_AMBULATORY_CARE_PROVIDER_SITE_OTHER): Payer: Medicare HMO | Admitting: Neurology

## 2020-07-18 ENCOUNTER — Other Ambulatory Visit: Payer: Self-pay

## 2020-07-18 VITALS — BP 144/68 | HR 82 | Ht 65.0 in | Wt 180.0 lb

## 2020-07-18 DIAGNOSIS — G3184 Mild cognitive impairment, so stated: Secondary | ICD-10-CM | POA: Diagnosis not present

## 2020-07-18 DIAGNOSIS — F32A Depression, unspecified: Secondary | ICD-10-CM

## 2020-07-18 DIAGNOSIS — F329 Major depressive disorder, single episode, unspecified: Secondary | ICD-10-CM | POA: Diagnosis not present

## 2020-07-18 NOTE — Patient Instructions (Signed)
There is no indication of dementia at this time. You can discontinue Memantine. Keeping your body healthy is the main thing to help keep your brain health. We will repeat Neurocognitive testing in a year to assess change over time. Follow-up in a year, call for any changes.  Try taking the gabapentin as prescribed to help with irritated nerve in your neck. Physical therapy may help as well.   RECOMMENDATIONS FOR ALL PATIENTS WITH MEMORY PROBLEMS: 1. Continue to exercise (Recommend 30 minutes of walking everyday, or 3 hours every week) 2. Increase social interactions - continue going to [place] and enjoy social gatherings with friends and family 3. Eat healthy, avoid fried foods and eat more fruits and vegetables 4. Maintain adequate blood pressure, blood sugar, and blood cholesterol level. Reducing the risk of stroke and cardiovascular disease also helps promoting better memory. 5. Avoid stressful situations. Live a simple life and avoid aggravations. Organize your time and prepare for the next day in anticipation. 6. Sleep well, avoid any interruptions of sleep and avoid any distractions in the bedroom that may interfere with adequate sleep quality 7. Avoid sugar, avoid sweets as there is a strong link between excessive sugar intake, diabetes, and cognitive impairment We discussed the Mediterranean diet, which has been shown to help patients reduce the risk of progressive memory disorders and reduces cardiovascular risk. This includes eating fish, eat fruits and green leafy vegetables, nuts like almonds and hazelnuts, walnuts, and also use olive oil. Avoid fast foods and fried foods as much as possible. Avoid sweets and sugar as sugar use has been linked to worsening of memory function.
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
## 2020-08-17 ENCOUNTER — Other Ambulatory Visit: Payer: Self-pay | Admitting: Internal Medicine

## 2020-08-23 ENCOUNTER — Other Ambulatory Visit: Payer: Self-pay | Admitting: Internal Medicine

## 2020-09-26 ENCOUNTER — Other Ambulatory Visit: Payer: Self-pay

## 2020-09-26 ENCOUNTER — Encounter: Payer: Self-pay | Admitting: Internal Medicine

## 2020-09-26 ENCOUNTER — Ambulatory Visit (INDEPENDENT_AMBULATORY_CARE_PROVIDER_SITE_OTHER): Payer: Medicare HMO | Admitting: Internal Medicine

## 2020-09-26 VITALS — BP 134/82 | HR 71 | Temp 98.3°F | Wt 182.2 lb

## 2020-09-26 DIAGNOSIS — K580 Irritable bowel syndrome with diarrhea: Secondary | ICD-10-CM | POA: Diagnosis not present

## 2020-09-26 DIAGNOSIS — E785 Hyperlipidemia, unspecified: Secondary | ICD-10-CM | POA: Diagnosis not present

## 2020-09-26 DIAGNOSIS — M797 Fibromyalgia: Secondary | ICD-10-CM | POA: Diagnosis not present

## 2020-09-26 DIAGNOSIS — E119 Type 2 diabetes mellitus without complications: Secondary | ICD-10-CM

## 2020-09-26 DIAGNOSIS — E1122 Type 2 diabetes mellitus with diabetic chronic kidney disease: Secondary | ICD-10-CM | POA: Diagnosis not present

## 2020-09-26 DIAGNOSIS — N183 Chronic kidney disease, stage 3 unspecified: Secondary | ICD-10-CM | POA: Diagnosis not present

## 2020-09-26 LAB — LIPID PANEL
Cholesterol: 200 mg/dL (ref 0–200)
HDL: 41.2 mg/dL (ref >39.00)
LDL Cholesterol: 135 mg/dL — ABNORMAL HIGH (ref 0–99)
NonHDL: 159.05
Total CHOL/HDL Ratio: 5
Triglycerides: 120 mg/dL (ref 0.0–149.0)
VLDL: 24 mg/dL (ref 0.0–40.0)

## 2020-09-26 LAB — COMPREHENSIVE METABOLIC PANEL
ALT: 10 U/L (ref 0–35)
AST: 17 U/L (ref 0–37)
Albumin: 4.2 g/dL (ref 3.5–5.2)
Alkaline Phosphatase: 74 U/L (ref 39–117)
BUN: 19 mg/dL (ref 6–23)
CO2: 31 mEq/L (ref 19–32)
Calcium: 9.5 mg/dL (ref 8.4–10.5)
Chloride: 105 mEq/L (ref 96–112)
Creatinine, Ser: 1.1 mg/dL (ref 0.40–1.20)
GFR: 48.73 mL/min — ABNORMAL LOW (ref >60.00)
Glucose, Bld: 88 mg/dL (ref 70–99)
Potassium: 3.9 mEq/L (ref 3.5–5.1)
Sodium: 143 mEq/L (ref 135–145)
Total Bilirubin: 1 mg/dL (ref 0.2–1.2)
Total Protein: 6.8 g/dL (ref 6.0–8.3)

## 2020-09-26 LAB — TSH: TSH: 4.18 u[IU]/mL (ref 0.35–4.50)

## 2020-09-26 LAB — HEMOGLOBIN A1C: Hgb A1c MFr Bld: 6.3 % (ref 4.6–6.5)

## 2020-09-26 NOTE — Assessment & Plan Note (Signed)
Try Gluten free diet

## 2020-09-26 NOTE — Progress Notes (Signed)
Subjective:  Patient ID: Claudia [name], female    DOB: 1946/03/13  Age: 74 y.o. MRN: [id number]  CC: Follow-up (6 month f/u)   HPI Emer B Geary presents for GI upset, diarrhea x several months. On Lactacid milk C/o fatigue F/u depression  Outpatient Medications Prior to Visit  Medication Sig Dispense Refill  . amLODipine (NORVASC) 2.5 MG tablet TAKE 1 TABLET EVERY DAY 90 tablet 3  . cholecalciferol (VITAMIN D) 1000 units tablet Take 1 tablet by mouth daily.    . cyanocobalamin (,VITAMIN B-12,) 1000 MCG/ML injection Inject 1 mL (1,000 mcg total) into the skin every 14 (fourteen) days. 10 mL 6  . DULoxetine (CYMBALTA) 60 MG capsule TAKE 1 CAPSULE EVERY DAY FOR DEPRESSION 90 capsule 3  . gabapentin (NEURONTIN) 600 MG tablet TAKE 1 TABLET THREE TIMES DAILY 270 tablet 1  . metFORMIN (GLUCOPHAGE) 500 MG tablet TAKE 1 TABLET EVERY DAY WITH BREAKFAST 90 tablet 3  . olmesartan (BENICAR) 20 MG tablet TAKE 1 TABLET EVERY DAY 90 tablet 3  . omeprazole (PRILOSEC) 40 MG capsule TAKE 1 CAPSULE TWICE DAILY 180 capsule 3  . solifenacin (VESICARE) 5 MG tablet Take 1 tablet (5 mg total) by mouth daily. Annual appt due in Nov must see provider for future refills 90 tablet 0  . SYRINGE-NEEDLE, DISP, 3 ML (BD ECLIPSE SYRINGE) 25G X 1" 3 ML MISC As directed SQ for B12 shots 50 each 3  . memantine (NAMENDA) 10 MG tablet Take 1 tablet (10 mg total) by mouth 2 (two) times daily. (Patient not taking: Reported on 09/26/2020) 180 tablet 1   No facility-administered medications prior to visit.    ROS: Review of Systems  Constitutional: Positive for fatigue. Negative for activity change, appetite change, chills and unexpected weight change.  HENT: Negative for congestion, mouth sores and sinus pressure.   Eyes: Negative for visual disturbance.  Respiratory: Negative for cough and chest tightness.   Gastrointestinal: Positive for abdominal distention, diarrhea and nausea. Negative for abdominal pain.    Genitourinary: Negative for difficulty urinating, frequency and vaginal pain.  Musculoskeletal: Negative for back pain and gait problem.  Skin: Negative for pallor and rash.  Neurological: Negative for dizziness, tremors, weakness, numbness and headaches.  Psychiatric/Behavioral: Positive for decreased concentration. Negative for confusion, sleep disturbance and suicidal ideas. The patient is nervous/anxious.     Objective:  BP 134/82 (BP Location: Left Arm)   Pulse 71   Temp 98.3 F (36.8 C) (Oral)   Wt 182 lb 3.2 oz (82.6 kg)   SpO2 97%   BMI 30.32 kg/m   BP Readings from Last 3 Encounters:  09/26/20 134/82  07/18/20 (!) 144/68  05/18/20 134/88    Wt Readings from Last 3 Encounters:  09/26/20 182 lb 3.2 oz (82.6 kg)  07/18/20 180 lb (81.6 kg)  05/18/20 171 lb (77.6 kg)    Physical Exam Constitutional:      General: She is not in acute distress.    Appearance: She is well-developed. She is obese.  HENT:     Head: Normocephalic.     Right Ear: External ear normal.     Left Ear: External ear normal.     Nose: Nose normal.  Eyes:     General:        Right eye: No discharge.        Left eye: No discharge.     Conjunctiva/sclera: Conjunctivae normal.     Pupils: Pupils are equal, round, and reactive to light.  Neck:     Thyroid: No thyromegaly.     Vascular: No JVD.     Trachea: No tracheal deviation.  Cardiovascular:     Rate and Rhythm: Normal rate and regular rhythm.     Heart sounds: Normal heart sounds.  Pulmonary:     Effort: No respiratory distress.     Breath sounds: No stridor. No wheezing.  Abdominal:     General: Bowel sounds are normal. There is no distension.     Palpations: Abdomen is soft. There is no mass.     Tenderness: There is no abdominal tenderness. There is no guarding or rebound.  Musculoskeletal:        General: No tenderness.     Cervical back: Normal range of motion and neck supple.  Lymphadenopathy:     Cervical: No cervical  adenopathy.  Skin:    Findings: No erythema or rash.  Neurological:     Cranial Nerves: No cranial nerve deficit.     Motor: No abnormal muscle tone.     Coordination: Coordination normal.     Deep Tendon Reflexes: Reflexes normal.  Psychiatric:        Behavior: Behavior normal.        Thought Content: Thought content normal.        Judgment: Judgment normal.     Lab Results  Component Value Date   WBC 8.0 03/27/2020   HGB 14.2 03/27/2020   HCT 41.9 03/27/2020   PLT 275.0 03/27/2020   GLUCOSE 125 (H) 03/27/2020   CHOL 235 (H) 03/27/2020   TRIG 125.0 03/27/2020   HDL 41.20 03/27/2020   LDLCALC 169 (H) 03/27/2020   ALT 10 03/27/2020   AST 17 03/27/2020   NA 141 03/27/2020   K 4.5 03/27/2020   CL 106 03/27/2020   CREATININE 1.02 03/27/2020   BUN 18 03/27/2020   CO2 29 03/27/2020   TSH 4.42 03/27/2020   INR 1.02 05/26/2015   HGBA1C 5.8 09/21/2019    MR BRAIN W WO CONTRAST  Result Date: 06/11/2020 CLINICAL DATA:  Memory loss.  Falls.  History of breast cancer. EXAM: MRI HEAD WITHOUT AND WITH CONTRAST TECHNIQUE: Multiplanar, multiecho pulse sequences of the brain and surrounding structures were obtained without and with intravenous contrast. CONTRAST:  79mL MULTIHANCE GADOBENATE DIMEGLUMINE 529 MG/ML IV SOLN COMPARISON:  Head CT 10/29/2019 and MRI 06/05/2005 FINDINGS: Brain: There is no evidence of acute infarct, intracranial hemorrhage, mass, midline shift, or extra-axial fluid collection. The ventricles and sulci are normal without age advanced or lobar predominant atrophy. T2 hyperintensities in the cerebral white matter and pons are new from the remote MRI and are nonspecific but compatible with chronic small vessel ischemic disease which is mild in the cerebral white matter and moderate in the pons. No abnormal enhancement is identified. Vascular: Major intracranial vascular flow voids are preserved. Skull and upper cervical spine: No calvarial lesion. Cervical facet arthrosis  with trace anterolisthesis of C3 on C4. Sinuses/Orbits: Bilateral cataract extraction. Paranasal sinuses and mastoid air cells are clear. Other: None. IMPRESSION: 1. No acute intracranial abnormality or mass. 2. Mild-to-moderate chronic small vessel ischemic disease, new from 2006. Electronically Signed   By: Logan Bores M.D.   On: 06/11/2020 13:53    Assessment & Plan:    Walker Kehr, MD

## 2020-09-26 NOTE — Addendum Note (Signed)
Addended by: Cresenciano Lick on: 09/26/2020 10:34 AM   Modules accepted: Orders

## 2020-09-26 NOTE — Assessment & Plan Note (Signed)
Labs

## 2020-09-26 NOTE — Patient Instructions (Addendum)
  Gluten free trial for 4-6 weeks. OK to use gluten-free bread and gluten-free pasta.    Gluten-Free Diet for Celiac Disease, Adult The gluten-free diet includes all foods that do not contain gluten. Gluten is a protein that is found in wheat, rye, barley, and some other grains. Following the gluten-free diet is the only treatment for people with celiac disease. It helps to prevent damage to the intestines and improves or eliminates the symptoms of celiac disease. Following the gluten-free diet requires some planning. It can be challenging at first, but it gets easier with time and practice. There are more gluten-free options available today than ever before. If you need help finding gluten-free foods or if you have questions, talk with your diet and nutrition specialist (registered dietitian) or your health care provider. What do I need to know about a gluten-free diet?  All fruits, vegetables, and meats are safe to eat and do not contain gluten.  When grocery shopping, start by shopping in the produce, meat, and dairy sections. These sections are more likely to contain gluten-free foods. Then move to the aisles that contain packaged foods if you need to.  Read all food labels. Gluten is often added to foods. Always check the ingredient list and look for warnings, such as "may contain gluten."  Talk with your dietitian or health care provider before taking a gluten-free multivitamin or mineral supplement.  Be aware of gluten-free foods having contact with foods that contain gluten (cross-contamination). This can happen at home and with any processed foods. ? Talk with your health care provider or dietitian about how to reduce the risk of cross-contamination in your home. ? If you have questions about how a food is processed, ask the manufacturer. What key words help to identify gluten? Foods that list any of these key words on the label usually contain gluten:  Wheat, flour, enriched  flour, bromated flour, white flour, durum flour, graham flour, phosphated flour, self-rising flour, semolina, farina, barley (malt), rye, and oats.  Starch, dextrin, modified food starch, or cereal.  Thickening, fillers, or emulsifiers.  Malt flavoring, malt extract, or malt syrup.  Hydrolyzed vegetable protein.  In the [place] packaged foods that are gluten-free are required to be labeled "GF." These foods should be easy to identify and are safe to eat. In the [place] food companies are also required to list common food allergens, including wheat, on their labels. Recommended foods Grains  Amaranth, bean flours, 100% buckwheat flour, corn, millet, nut flours or nut meals, GF oats, quinoa, rice, sorghum, teff, rice wafers, pure cornmeal tortillas, popcorn, and hot cereals made from cornmeal. Hominy, rice, wild rice. Some Asian rice noodles or bean noodles. Arrowroot starch, corn bran, corn flour, corn germ, cornmeal, corn starch, potato flour, potato starch flour, and rice bran. Plain, brown, and sweet rice flours. Rice polish, soy flour, and tapioca starch. Vegetables  All plain fresh, frozen, and canned vegetables. Fruits  All plain fresh, frozen, canned, and dried fruits, and 100% fruit juices. Meats and other protein foods  All fresh beef, pork, poultry, fish, seafood, and eggs. Fish canned in water, oil, brine, or vegetable broth. Plain nuts and seeds, peanut butter. Some lunch meat and some frankfurters. Dried beans, dried peas, and lentils. Dairy  Fresh plain, dry, evaporated, or condensed milk. Cream, butter, sour cream, whipping cream, and most yogurts. Unprocessed cheese, most processed cheeses, some cottage cheese, some cream cheeses. Beverages  Coffee, tea, most herbal teas. Carbonated beverages and some root beers.   Wine, sake, and distilled spirits, such as gin, vodka, and whiskey. Most hard ciders. Fats and oils  Butter, margarine, vegetable oil, hydrogenated butter, olive  oil, shortening, lard, cream, and some mayonnaise. Some commercial salad dressings. Olives. Sweets and desserts  Sugar, honey, some syrups, molasses, jelly, and jam. Plain hard candy, marshmallows, and gumdrops. Pure cocoa powder. Plain chocolate. Custard and some pudding mixes. Gelatin desserts, sorbets, frozen ice pops, and sherbet. Cake, cookies, and other desserts prepared with allowed flours. Some commercial ice creams. Cornstarch, tapioca, and rice puddings. Seasoning and other foods  Some canned or frozen soups. Monosodium glutamate (MSG). Cider, rice, and wine vinegar. Baking soda and baking powder. Cream of tartar. Baking and nutritional yeast. Certain soy sauces made without wheat (ask your dietitian about specific brands that are allowed). Nuts, coconut, and chocolate. Salt, pepper, herbs, spices, flavoring extracts, imitation or artificial flavorings, natural flavorings, and food colorings. Some medicines and supplements. Some lip glosses and other cosmetics. Rice syrups. The items listed may not be a complete list. Talk with your dietitian about what dietary choices are best for you. Foods to avoid Grains  Barley, bran, bulgur, couscous, cracked wheat, , farro, graham, malt, matzo, semolina, wheat germ, and all wheat and rye cereals including spelt and kamut. Cereals containing malt as a flavoring, such as rice cereal. Noodles, spaghetti, macaroni, most packaged rice mixes, and all mixes containing wheat, rye, barley, or triticale. Vegetables  Most creamed vegetables and most vegetables canned in sauces. Some commercially prepared vegetables and salads. Fruits  Thickened or prepared fruits and some pie fillings. Some fruit snacks and fruit roll-ups. Meats and other protein foods  Any meat or meat alternative containing wheat, rye, barley, or gluten stabilizers. These are often marinated or packaged meats and lunch meats. Bread-containing products, such as Swiss steak,  croquettes, meatballs, and meatloaf. Most tuna canned in vegetable broth and turkey with hydrolyzed vegetable protein (HVP) injected as part of the basting. Seitan. Imitation fish. Eggs in sauces made from ingredients to avoid. Dairy  Commercial chocolate milk drinks and malted milk. Some non-dairy creamers. Any cheese product containing ingredients to avoid. Beverages  Certain cereal beverages. Beer, ale, malted milk, and some root beers. Some hard ciders. Some instant flavored coffees. Some herbal teas made with barley or with barley malt added. Fats and oils  Some commercial salad dressings. Sour cream containing modified food starch. Sweets and desserts  Some toffees. Chocolate-coated nuts (may be rolled in wheat flour) and some commercial candies and candy bars. Most cakes, cookies, donuts, pastries, and other baked goods. Some commercial ice cream. Ice cream cones. Commercially prepared mixes for cakes, cookies, and other desserts. Bread pudding and other puddings thickened with flour. Products containing brown rice syrup made with barley malt enzyme. Desserts and sweets made with malt flavoring. Seasoning and other foods  Some curry powders, some dry seasoning mixes, some gravy extracts, some meat sauces, some ketchups, some prepared mustards, and horseradish. Certain soy sauces. Malt vinegar. Bouillon and bouillon cubes that contain HVP. Some chip dips, and some chewing gum. Yeast extract. Brewer's yeast. Caramel color. Some medicines and supplements. Some lip glosses and other cosmetics. The items listed may not be a complete list. Talk with your dietitian about what dietary choices are best for you. Summary  Gluten is a protein that is found in wheat, rye, barley, and some other grains. The gluten-free diet includes all foods that do not contain gluten.  If you need help finding gluten-free foods or if   you have questions, talk with your diet and nutrition specialist (registered  dietitian) or your health care provider.  Read all food labels. Gluten is often added to foods. Always check the ingredient list and look for warnings, such as "may contain gluten." This information is not intended to replace advice given to you by your health care provider. Make sure you discuss any questions you have with your health care provider. Document Released: 11/04/2005 Document Revised: 08/19/2016 Document Reviewed: 08/19/2016 Elsevier Interactive Patient Education  2018 [place] can try [employer] Mushroom extract or capsules for memory

## 2020-09-26 NOTE — Assessment & Plan Note (Signed)
On Metformin,  Off Victoza

## 2020-09-26 NOTE — Assessment & Plan Note (Signed)
Chronic IBS-D Levsin prn Lactose free, try Gluten free diet

## 2020-10-13 ENCOUNTER — Other Ambulatory Visit: Payer: Self-pay | Admitting: Internal Medicine

## 2020-12-26 ENCOUNTER — Other Ambulatory Visit: Payer: Self-pay

## 2020-12-27 ENCOUNTER — Ambulatory Visit (INDEPENDENT_AMBULATORY_CARE_PROVIDER_SITE_OTHER): Payer: Medicare HMO | Admitting: Internal Medicine

## 2020-12-27 ENCOUNTER — Encounter: Payer: Self-pay | Admitting: Internal Medicine

## 2020-12-27 ENCOUNTER — Other Ambulatory Visit: Payer: Self-pay

## 2020-12-27 VITALS — BP 126/76 | HR 82 | Resp 18 | Ht 60.0 in | Wt 179.0 lb

## 2020-12-27 DIAGNOSIS — R6889 Other general symptoms and signs: Secondary | ICD-10-CM | POA: Diagnosis not present

## 2020-12-27 DIAGNOSIS — E119 Type 2 diabetes mellitus without complications: Secondary | ICD-10-CM | POA: Diagnosis not present

## 2020-12-27 DIAGNOSIS — I10 Essential (primary) hypertension: Secondary | ICD-10-CM | POA: Diagnosis not present

## 2020-12-27 DIAGNOSIS — N183 Chronic kidney disease, stage 3 unspecified: Secondary | ICD-10-CM

## 2020-12-27 MED ORDER — OLMESARTAN MEDOXOMIL 20 MG PO TABS
20.0000 mg | ORAL_TABLET | Freq: Every day | ORAL | 3 refills | Status: DC
Start: 2020-12-27 — End: 2021-02-26

## 2020-12-27 MED ORDER — AMLODIPINE BESYLATE 2.5 MG PO TABS
2.5000 mg | ORAL_TABLET | Freq: Every day | ORAL | 3 refills | Status: DC
Start: 2020-12-27 — End: 2022-01-28

## 2020-12-27 MED ORDER — OMEPRAZOLE 40 MG PO CPDR
40.0000 mg | DELAYED_RELEASE_CAPSULE | Freq: Two times a day (BID) | ORAL | 3 refills | Status: DC
Start: 2020-12-27 — End: 2021-04-24

## 2020-12-27 MED ORDER — GABAPENTIN 600 MG PO TABS
600.0000 mg | ORAL_TABLET | Freq: Three times a day (TID) | ORAL | 1 refills | Status: DC
Start: 2020-12-27 — End: 2021-02-23

## 2020-12-27 MED ORDER — SOLIFENACIN SUCCINATE 5 MG PO TABS
ORAL_TABLET | ORAL | 3 refills | Status: DC
Start: 2020-12-27 — End: 2021-10-25

## 2020-12-27 MED ORDER — DULOXETINE HCL 60 MG PO CPEP
ORAL_CAPSULE | ORAL | 3 refills | Status: DC
Start: 2020-12-27 — End: 2021-10-25

## 2020-12-27 MED ORDER — METFORMIN HCL 500 MG PO TABS
ORAL_TABLET | ORAL | 3 refills | Status: DC
Start: 2020-12-27 — End: 2021-10-19

## 2020-12-27 NOTE — Patient Instructions (Signed)
You can try [employer] Mushroom capsules for memory, focus, neuropathy ( [employer])    

## 2020-12-27 NOTE — Assessment & Plan Note (Signed)
Continue with Norvasc, Olmesartan

## 2020-12-27 NOTE — Assessment & Plan Note (Signed)
On Metformin.  Check A1c 

## 2020-12-27 NOTE — Progress Notes (Signed)
Subjective:  Patient ID: Claudia [name], female    DOB: Jan 07, 1946  Age: 75 y.o. MRN: [id number]  CC: 3 month f/u (Rm 17. Patient stated that she feels fatigued a lot. She stated that her memory is getting worse. And she is also having a lot of dry mouth. She thinks it may be from her Vesicare. ) and Health Maintenance (Eye exam has been completed. Foot exam is due. )   HPI Claudia [name] presents for OAB, DM, memory problems, HTN f/u  Outpatient Medications Prior to Visit  Medication Sig Dispense Refill  . cholecalciferol (VITAMIN D) 1000 units tablet Take 1 tablet by mouth daily.    . cyanocobalamin (,VITAMIN B-12,) 1000 MCG/ML injection Inject 1 mL (1,000 mcg total) into the skin every 14 (fourteen) days. 10 mL 6  . SYRINGE-NEEDLE, DISP, 3 ML (BD ECLIPSE SYRINGE) 25G X 1" 3 ML MISC As directed SQ for B12 shots 50 each 3  . amLODipine (NORVASC) 2.5 MG tablet TAKE 1 TABLET EVERY DAY 90 tablet 3  . DULoxetine (CYMBALTA) 60 MG capsule TAKE 1 CAPSULE EVERY DAY FOR DEPRESSION 90 capsule 3  . gabapentin (NEURONTIN) 600 MG tablet TAKE 1 TABLET THREE TIMES DAILY 270 tablet 1  . metFORMIN (GLUCOPHAGE) 500 MG tablet TAKE 1 TABLET EVERY DAY WITH BREAKFAST 90 tablet 3  . olmesartan (BENICAR) 20 MG tablet TAKE 1 TABLET EVERY DAY 90 tablet 3  . omeprazole (PRILOSEC) 40 MG capsule TAKE 1 CAPSULE TWICE DAILY 180 capsule 3  . solifenacin (VESICARE) 5 MG tablet TAKE 1 TABLET EVERY DAY (ANNUAL APPOINTMENT DUE IN NOVEMBER, MUST SEE PROVIDER FOR FUTURE REFILLS) 90 tablet 3   No facility-administered medications prior to visit.    ROS: Review of Systems  Constitutional: Positive for fatigue. Negative for activity change, appetite change, chills and unexpected weight change.  HENT: Negative for congestion, mouth sores and sinus pressure.   Eyes: Negative for visual disturbance.  Respiratory: Negative for cough and chest tightness.   Gastrointestinal: Negative for abdominal pain and nausea.   Genitourinary: Negative for difficulty urinating, frequency and vaginal pain.  Musculoskeletal: Positive for arthralgias, back pain and gait problem.  Skin: Negative for pallor and rash.  Neurological: Negative for dizziness, tremors, weakness, numbness and headaches.  Psychiatric/Behavioral: Positive for decreased concentration, dysphoric mood and sleep disturbance. Negative for confusion and suicidal ideas. The patient is nervous/anxious.     Objective:  BP 126/76   Pulse 82   Resp 18   Ht 5' (1.524 m)   Wt 179 lb (81.2 kg)   SpO2 95%   BMI 34.96 kg/m   BP Readings from Last 3 Encounters:  12/27/20 126/76  09/26/20 134/82  07/18/20 (!) 144/68    Wt Readings from Last 3 Encounters:  12/27/20 179 lb (81.2 kg)  09/26/20 182 lb 3.2 oz (82.6 kg)  07/18/20 180 lb (81.6 kg)    Physical Exam Constitutional:      General: She is not in acute distress.    Appearance: She is well-developed. She is obese.  HENT:     Head: Normocephalic.     Right Ear: External ear normal.     Left Ear: External ear normal.     Nose: Nose normal.     Mouth/Throat:     Mouth: Oropharynx is clear and moist.  Eyes:     General:        Right eye: No discharge.        Left eye: No discharge.  Conjunctiva/sclera: Conjunctivae normal.     Pupils: Pupils are equal, round, and reactive to light.  Neck:     Thyroid: No thyromegaly.     Vascular: No JVD.     Trachea: No tracheal deviation.  Cardiovascular:     Rate and Rhythm: Normal rate and regular rhythm.     Heart sounds: Normal heart sounds.  Pulmonary:     Effort: No respiratory distress.     Breath sounds: No stridor. No wheezing.  Abdominal:     General: Bowel sounds are normal. There is no distension.     Palpations: Abdomen is soft. There is no mass.     Tenderness: There is no abdominal tenderness. There is no guarding or rebound.  Musculoskeletal:        General: Tenderness present. No edema.     Cervical back: Normal range of  motion and neck supple.  Lymphadenopathy:     Cervical: No cervical adenopathy.  Skin:    Findings: No erythema or rash.  Neurological:     Mental Status: She is oriented to person, place, and time.     Cranial Nerves: No cranial nerve deficit.     Motor: No abnormal muscle tone.     Coordination: Coordination normal.     Deep Tendon Reflexes: Reflexes normal.  Psychiatric:        Mood and Affect: Mood and affect normal.        Behavior: Behavior normal.        Thought Content: Thought content normal.        Judgment: Judgment normal.     Lab Results  Component Value Date   WBC 8.0 03/27/2020   HGB 14.2 03/27/2020   HCT 41.9 03/27/2020   PLT 275.0 03/27/2020   GLUCOSE 88 09/26/2020   CHOL 200 09/26/2020   TRIG 120.0 09/26/2020   HDL 41.20 09/26/2020   LDLCALC 135 (H) 09/26/2020   ALT 10 09/26/2020   AST 17 09/26/2020   NA 143 09/26/2020   K 3.9 09/26/2020   CL 105 09/26/2020   CREATININE 1.10 09/26/2020   BUN 19 09/26/2020   CO2 31 09/26/2020   TSH 4.18 09/26/2020   INR 1.02 05/26/2015   HGBA1C 6.3 09/26/2020    MR BRAIN W WO CONTRAST  Result Date: 06/11/2020 CLINICAL DATA:  Memory loss.  Falls.  History of breast cancer. EXAM: MRI HEAD WITHOUT AND WITH CONTRAST TECHNIQUE: Multiplanar, multiecho pulse sequences of the brain and surrounding structures were obtained without and with intravenous contrast. CONTRAST:  38mL MULTIHANCE GADOBENATE DIMEGLUMINE 529 MG/ML IV SOLN COMPARISON:  Head CT 10/29/2019 and MRI 06/05/2005 FINDINGS: Brain: There is no evidence of acute infarct, intracranial hemorrhage, mass, midline shift, or extra-axial fluid collection. The ventricles and sulci are normal without age advanced or lobar predominant atrophy. T2 hyperintensities in the cerebral white matter and pons are new from the remote MRI and are nonspecific but compatible with chronic small vessel ischemic disease which is mild in the cerebral white matter and moderate in the pons. No  abnormal enhancement is identified. Vascular: Major intracranial vascular flow voids are preserved. Skull and upper cervical spine: No calvarial lesion. Cervical facet arthrosis with trace anterolisthesis of C3 on C4. Sinuses/Orbits: Bilateral cataract extraction. Paranasal sinuses and mastoid air cells are clear. Other: None. IMPRESSION: 1. No acute intracranial abnormality or mass. 2. Mild-to-moderate chronic small vessel ischemic disease, new from 2006. Electronically Signed   By: Logan Bores M.D.   On: 06/11/2020 13:53  Assessment & Plan:   Faigy was seen today for 3 month f/u and health maintenance.  Diagnoses and all orders for this visit:  Essential hypertension  Controlled type 2 diabetes mellitus without complication, without long-term current use of insulin (HCC)  CRI (chronic renal insufficiency), stage 3 (moderate) (HCC)  Forgetfulness  Other orders -     solifenacin (VESICARE) 5 MG tablet; TAKE 1 TABLET EVERY DAY for your bladder -     amLODipine (NORVASC) 2.5 MG tablet; Take 1 tablet (2.5 mg total) by mouth daily. For your blood pressure -     DULoxetine (CYMBALTA) 60 MG capsule; TAKE 1 CAPSULE EVERY DAY FOR DEPRESSION -     gabapentin (NEURONTIN) 600 MG tablet; Take 1 tablet (600 mg total) by mouth 3 (three) times daily. For pain -     metFORMIN (GLUCOPHAGE) 500 MG tablet; TAKE 1 TABLET EVERY DAY WITH BREAKFAST for sugar -     olmesartan (BENICAR) 20 MG tablet; Take 1 tablet (20 mg total) by mouth daily. For your blood pressure -     omeprazole (PRILOSEC) 40 MG capsule; Take 1 capsule (40 mg total) by mouth 2 (two) times daily. For your GERD     Meds ordered this encounter  Medications  . solifenacin (VESICARE) 5 MG tablet    Sig: TAKE 1 TABLET EVERY DAY for your bladder    Dispense:  90 tablet    Refill:  3  . amLODipine (NORVASC) 2.5 MG tablet    Sig: Take 1 tablet (2.5 mg total) by mouth daily. For your blood pressure    Dispense:  90 tablet    Refill:  3   . DULoxetine (CYMBALTA) 60 MG capsule    Sig: TAKE 1 CAPSULE EVERY DAY FOR DEPRESSION    Dispense:  90 capsule    Refill:  3  . gabapentin (NEURONTIN) 600 MG tablet    Sig: Take 1 tablet (600 mg total) by mouth 3 (three) times daily. For pain    Dispense:  270 tablet    Refill:  1  . metFORMIN (GLUCOPHAGE) 500 MG tablet    Sig: TAKE 1 TABLET EVERY DAY WITH BREAKFAST for sugar    Dispense:  90 tablet    Refill:  3  . olmesartan (BENICAR) 20 MG tablet    Sig: Take 1 tablet (20 mg total) by mouth daily. For your blood pressure    Dispense:  90 tablet    Refill:  3  . omeprazole (PRILOSEC) 40 MG capsule    Sig: Take 1 capsule (40 mg total) by mouth 2 (two) times daily. For your GERD    Dispense:  180 capsule    Refill:  3     Follow-up: Return in about 3 months (around 03/26/2021) for a follow-up visit.  Walker Kehr, MD

## 2020-12-27 NOTE — Assessment & Plan Note (Addendum)
Options discussed She declined Aricept Try [employer] Prevagen is too $$$

## 2020-12-27 NOTE — Assessment & Plan Note (Signed)
Hydrate well Good BP control

## 2021-01-25 DIAGNOSIS — H524 Presbyopia: Secondary | ICD-10-CM | POA: Diagnosis not present

## 2021-01-25 DIAGNOSIS — Z961 Presence of intraocular lens: Secondary | ICD-10-CM | POA: Diagnosis not present

## 2021-01-25 DIAGNOSIS — Z0100 Encounter for examination of eyes and vision without abnormal findings: Secondary | ICD-10-CM | POA: Diagnosis not present

## 2021-01-25 DIAGNOSIS — H353131 Nonexudative age-related macular degeneration, bilateral, early dry stage: Secondary | ICD-10-CM | POA: Diagnosis not present

## 2021-01-25 DIAGNOSIS — H52223 Regular astigmatism, bilateral: Secondary | ICD-10-CM | POA: Diagnosis not present

## 2021-01-25 DIAGNOSIS — E119 Type 2 diabetes mellitus without complications: Secondary | ICD-10-CM | POA: Diagnosis not present

## 2021-01-25 DIAGNOSIS — H5213 Myopia, bilateral: Secondary | ICD-10-CM | POA: Diagnosis not present

## 2021-02-23 ENCOUNTER — Other Ambulatory Visit: Payer: Self-pay | Admitting: Internal Medicine

## 2021-02-26 ENCOUNTER — Other Ambulatory Visit: Payer: Self-pay | Admitting: Internal Medicine

## 2021-03-28 ENCOUNTER — Other Ambulatory Visit: Payer: Self-pay

## 2021-03-28 ENCOUNTER — Ambulatory Visit (INDEPENDENT_AMBULATORY_CARE_PROVIDER_SITE_OTHER): Payer: Medicare HMO | Admitting: Internal Medicine

## 2021-03-28 ENCOUNTER — Encounter: Payer: Self-pay | Admitting: Internal Medicine

## 2021-03-28 DIAGNOSIS — D485 Neoplasm of uncertain behavior of skin: Secondary | ICD-10-CM

## 2021-03-28 DIAGNOSIS — N183 Chronic kidney disease, stage 3 unspecified: Secondary | ICD-10-CM

## 2021-03-28 DIAGNOSIS — D510 Vitamin B12 deficiency anemia due to intrinsic factor deficiency: Secondary | ICD-10-CM | POA: Diagnosis not present

## 2021-03-28 DIAGNOSIS — F419 Anxiety disorder, unspecified: Secondary | ICD-10-CM | POA: Diagnosis not present

## 2021-03-28 DIAGNOSIS — R5382 Chronic fatigue, unspecified: Secondary | ICD-10-CM | POA: Diagnosis not present

## 2021-03-28 DIAGNOSIS — R413 Other amnesia: Secondary | ICD-10-CM | POA: Diagnosis not present

## 2021-03-28 LAB — COMPREHENSIVE METABOLIC PANEL
ALT: 9 U/L (ref 0–35)
AST: 15 U/L (ref 0–37)
Albumin: 4.1 g/dL (ref 3.5–5.2)
Alkaline Phosphatase: 79 U/L (ref 39–117)
BUN: 27 mg/dL — ABNORMAL HIGH (ref 6–23)
CO2: 26 mEq/L (ref 19–32)
Calcium: 9.1 mg/dL (ref 8.4–10.5)
Chloride: 105 mEq/L (ref 96–112)
Creatinine, Ser: 1.36 mg/dL — ABNORMAL HIGH (ref 0.40–1.20)
GFR: 37.65 mL/min — ABNORMAL LOW (ref >60.00)
Glucose, Bld: 124 mg/dL — ABNORMAL HIGH (ref 70–99)
Potassium: 4.1 mEq/L (ref 3.5–5.1)
Sodium: 140 mEq/L (ref 135–145)
Total Bilirubin: 0.9 mg/dL (ref 0.2–1.2)
Total Protein: 6.6 g/dL (ref 6.0–8.3)

## 2021-03-28 LAB — VITAMIN B12: Vitamin B-12: 1044 pg/mL — ABNORMAL HIGH (ref 211–911)

## 2021-03-28 MED ORDER — VITAMIN B-12 1000 MCG SL SUBL: 1.0000 | SUBLINGUAL_TABLET | Freq: Every day | SUBLINGUAL | 3 refills | Status: DC

## 2021-03-28 NOTE — Assessment & Plan Note (Signed)
On Duloxetine.

## 2021-03-28 NOTE — Assessment & Plan Note (Signed)
6 mm lesion on the chest - r/o CA Skin bx

## 2021-03-28 NOTE — Assessment & Plan Note (Signed)
Check CMET, B12

## 2021-03-28 NOTE — Progress Notes (Signed)
Subjective:  Patient ID: Claudia [name], female    DOB: Mar 17, 1946  Age: 75 y.o. MRN: [id number]  CC: Follow-up (3 month f/u)   HPI Jamieson B Beumer presents for HTN, B12 def, FMS f/u The pt tripped and fell in her boathouse. C/o CP  Outpatient Medications Prior to Visit  Medication Sig Dispense Refill  . amLODipine (NORVASC) 2.5 MG tablet Take 1 tablet (2.5 mg total) by mouth daily. For your blood pressure 90 tablet 3  . cholecalciferol (VITAMIN D) 1000 units tablet Take 1 tablet by mouth daily.    . cyanocobalamin (,VITAMIN B-12,) 1000 MCG/ML injection Inject 1 mL (1,000 mcg total) into the skin every 14 (fourteen) days. 10 mL 6  . DULoxetine (CYMBALTA) 60 MG capsule TAKE 1 CAPSULE EVERY DAY FOR DEPRESSION 90 capsule 3  . gabapentin (NEURONTIN) 600 MG tablet TAKE 1 TABLET THREE TIMES DAILY 270 tablet 3  . metFORMIN (GLUCOPHAGE) 500 MG tablet TAKE 1 TABLET EVERY DAY WITH BREAKFAST for sugar 90 tablet 3  . olmesartan (BENICAR) 20 MG tablet Take 1 tablet (20 mg total) by mouth daily. Annual appt due in May must see provider for future refills 90 tablet 0  . omeprazole (PRILOSEC) 40 MG capsule Take 1 capsule (40 mg total) by mouth 2 (two) times daily. For your GERD 180 capsule 3  . solifenacin (VESICARE) 5 MG tablet TAKE 1 TABLET EVERY DAY for your bladder 90 tablet 3  . SYRINGE-NEEDLE, DISP, 3 ML (BD ECLIPSE SYRINGE) 25G X 1" 3 ML MISC As directed SQ for B12 shots 50 each 3   No facility-administered medications prior to visit.    ROS: Review of Systems  Constitutional: Positive for fatigue. Negative for activity change, appetite change, chills and unexpected weight change.  HENT: Negative for congestion, mouth sores and sinus pressure.   Eyes: Negative for visual disturbance.  Respiratory: Negative for cough and chest tightness.   Cardiovascular: Positive for chest pain.  Gastrointestinal: Negative for abdominal pain and nausea.  Genitourinary: Negative for difficulty urinating,  frequency and vaginal pain.  Musculoskeletal: Positive for arthralgias and back pain. Negative for gait problem.  Skin: Negative for pallor and rash.  Neurological: Negative for dizziness, tremors, weakness, numbness and headaches.  Psychiatric/Behavioral: Negative for confusion and sleep disturbance. The patient is nervous/anxious.     Objective:  BP 120/82 (BP Location: Left Arm)   Pulse 80   Temp 98.2 F (36.8 C) (Oral)   Ht 5\' 5"  (1.651 m)   Wt 180 lb 9.6 oz (81.9 kg)   SpO2 99%   BMI 30.05 kg/m   BP Readings from Last 3 Encounters:  03/28/21 120/82  12/27/20 126/76  09/26/20 134/82    Wt Readings from Last 3 Encounters:  03/28/21 180 lb 9.6 oz (81.9 kg)  12/27/20 179 lb (81.2 kg)  09/26/20 182 lb 3.2 oz (82.6 kg)    Physical Exam Constitutional:      General: She is not in acute distress.    Appearance: She is well-developed. She is obese. She is not ill-appearing.  HENT:     Head: Normocephalic.     Right Ear: External ear normal.     Left Ear: External ear normal.     Nose: Nose normal.  Eyes:     General:        Right eye: No discharge.        Left eye: No discharge.     Conjunctiva/sclera: Conjunctivae normal.     Pupils: Pupils are  equal, round, and reactive to light.  Neck:     Thyroid: No thyromegaly.     Vascular: No JVD.     Trachea: No tracheal deviation.  Cardiovascular:     Rate and Rhythm: Normal rate and regular rhythm.     Heart sounds: Normal heart sounds.  Pulmonary:     Effort: No respiratory distress.     Breath sounds: No stridor. No wheezing.  Abdominal:     General: Bowel sounds are normal. There is no distension.     Palpations: Abdomen is soft. There is no mass.     Tenderness: There is no abdominal tenderness. There is no guarding or rebound.  Musculoskeletal:        General: Tenderness present.     Cervical back: Normal range of motion and neck supple.  Lymphadenopathy:     Cervical: No cervical adenopathy.  Skin:     Findings: No erythema or rash.  Neurological:     Mental Status: She is oriented to person, place, and time.     Cranial Nerves: No cranial nerve deficit.     Motor: No abnormal muscle tone.     Coordination: Coordination normal.     Deep Tendon Reflexes: Reflexes normal.  Psychiatric:        Behavior: Behavior normal.        Thought Content: Thought content normal.        Judgment: Judgment normal.    6 mm lesion on the chest  Lab Results  Component Value Date   WBC 8.0 03/27/2020   HGB 14.2 03/27/2020   HCT 41.9 03/27/2020   PLT 275.0 03/27/2020   GLUCOSE 88 09/26/2020   CHOL 200 09/26/2020   TRIG 120.0 09/26/2020   HDL 41.20 09/26/2020   LDLCALC 135 (H) 09/26/2020   ALT 10 09/26/2020   AST 17 09/26/2020   NA 143 09/26/2020   K 3.9 09/26/2020   CL 105 09/26/2020   CREATININE 1.10 09/26/2020   BUN 19 09/26/2020   CO2 31 09/26/2020   TSH 4.18 09/26/2020   INR 1.02 05/26/2015   HGBA1C 6.3 09/26/2020    MR BRAIN W WO CONTRAST  Result Date: 06/11/2020 CLINICAL DATA:  Memory loss.  Falls.  History of breast cancer. EXAM: MRI HEAD WITHOUT AND WITH CONTRAST TECHNIQUE: Multiplanar, multiecho pulse sequences of the brain and surrounding structures were obtained without and with intravenous contrast. CONTRAST:  36mL MULTIHANCE GADOBENATE DIMEGLUMINE 529 MG/ML IV SOLN COMPARISON:  Head CT 10/29/2019 and MRI 06/05/2005 FINDINGS: Brain: There is no evidence of acute infarct, intracranial hemorrhage, mass, midline shift, or extra-axial fluid collection. The ventricles and sulci are normal without age advanced or lobar predominant atrophy. T2 hyperintensities in the cerebral white matter and pons are new from the remote MRI and are nonspecific but compatible with chronic small vessel ischemic disease which is mild in the cerebral white matter and moderate in the pons. No abnormal enhancement is identified. Vascular: Major intracranial vascular flow voids are preserved. Skull and upper  cervical spine: No calvarial lesion. Cervical facet arthrosis with trace anterolisthesis of C3 on C4. Sinuses/Orbits: Bilateral cataract extraction. Paranasal sinuses and mastoid air cells are clear. Other: None. IMPRESSION: 1. No acute intracranial abnormality or mass. 2. Mild-to-moderate chronic small vessel ischemic disease, new from 2006. Electronically Signed   By: Logan Bores M.D.   On: 06/11/2020 13:53    Assessment & Plan:    Walker Kehr, MD

## 2021-03-28 NOTE — Assessment & Plan Note (Signed)
On B12 po - ?compliance  Check B12 Risks associated with treatment noncompliance were discussed. Compliance was encouraged.

## 2021-03-28 NOTE — Assessment & Plan Note (Signed)
contusion

## 2021-03-28 NOTE — Addendum Note (Signed)
Addended by: Jacobo Forest on: 03/28/2021 09:51 AM   Modules accepted: Orders

## 2021-03-28 NOTE — Assessment & Plan Note (Signed)
Check BMET

## 2021-04-18 DIAGNOSIS — C44529 Squamous cell carcinoma of skin of other part of trunk: Secondary | ICD-10-CM | POA: Diagnosis not present

## 2021-04-18 DIAGNOSIS — D485 Neoplasm of uncertain behavior of skin: Secondary | ICD-10-CM | POA: Diagnosis not present

## 2021-04-20 ENCOUNTER — Telehealth: Payer: Self-pay | Admitting: Internal Medicine

## 2021-04-20 NOTE — Telephone Encounter (Signed)
   Patient requesting refill for omeprazole (PRILOSEC) 40 MG capsule  Pharmacy [facility]

## 2021-04-24 MED ORDER — OMEPRAZOLE 40 MG PO CPDR: 40.0000 mg | DELAYED_RELEASE_CAPSULE | Freq: Two times a day (BID) | ORAL | 3 refills | Status: DC

## 2021-04-24 NOTE — Telephone Encounter (Signed)
Reviewed chart pt is up-to-date sent refills to [facility]  

## 2021-05-08 ENCOUNTER — Ambulatory Visit: Payer: Medicare HMO

## 2021-05-08 ENCOUNTER — Ambulatory Visit (INDEPENDENT_AMBULATORY_CARE_PROVIDER_SITE_OTHER): Payer: Medicare HMO

## 2021-05-08 ENCOUNTER — Other Ambulatory Visit: Payer: Self-pay

## 2021-05-08 VITALS — BP 120/78 | HR 91 | Temp 97.7°F | Ht 65.0 in | Wt 175.6 lb

## 2021-05-08 DIAGNOSIS — Z0000 Encounter for general adult medical examination without abnormal findings: Secondary | ICD-10-CM

## 2021-05-08 NOTE — Patient Instructions (Signed)
Claudia [name] , Thank you for taking time to come for your Medicare Wellness Visit. I appreciate your ongoing commitment to your health goals. Please review the following plan we discussed and let me know if I can assist you in the future.   Screening recommendations/referrals: Colonoscopy: last done 03/17/2017; due every 5 years (due 03/17/2022) Mammogram: last done 01/16/2011 Bone Density: last done 04/25/2020; due every 2 years (due 04/25/2022) Recommended yearly ophthalmology/optometry visit for glaucoma screening and checkup Recommended yearly dental visit for hygiene and checkup  Vaccinations: Influenza vaccine: 09/09/2020; due 06/18/2021  Pneumococcal vaccine: 10/23/2011, 11/02/2014 Tdap vaccine: 10/23/2011; due every 10 years (due 10/22/2021) Shingles vaccine: never done   Covid-19: 12/07/2019, 12/28/2019, 09/09/2020  Advanced directives: Advance directive discussed with you today. Even though you declined this today please call our office should you change your mind and we can give you the proper paperwork for you to fill out.  Conditions/risks identified: Start walking more. Be more active .  Next appointment: Please schedule your next Medicare Wellness Visit with your Nurse Health Advisor in 1 year by calling [phone].   Preventive Care 67 Years and Older, Female Preventive care refers to lifestyle choices and visits with your health care provider that can promote health and wellness. What does preventive care include? A yearly physical exam. This is also called an annual well check. Dental exams once or twice a year. Routine eye exams. Ask your health care provider how often you should have your eyes checked. Personal lifestyle choices, including: Daily care of your teeth and gums. Regular physical activity. Eating a healthy diet. Avoiding tobacco and drug use. Limiting alcohol use. Practicing safe sex. Taking low-dose aspirin every day. Taking vitamin and mineral supplements as  recommended by your health care provider. What happens during an annual well check? The services and screenings done by your health care provider during your annual well check will depend on your age, overall health, lifestyle risk factors, and family history of disease. Counseling  Your health care provider may ask you questions about your: Alcohol use. Tobacco use. Drug use. Emotional well-being. Home and relationship well-being. Sexual activity. Eating habits. History of falls. Memory and ability to understand (cognition). Work and work Statistician. Reproductive health. Screening  You may have the following tests or measurements: Height, weight, and BMI. Blood pressure. Lipid and cholesterol levels. These may be checked every 5 years, or more frequently if you are over 76 years old. Skin check. Lung cancer screening. You may have this screening every year starting at age 64 if you have a 30-pack-year history of smoking and currently smoke or have quit within the past 15 years. Fecal occult blood test (FOBT) of the stool. You may have this test every year starting at age 60. Flexible sigmoidoscopy or colonoscopy. You may have a sigmoidoscopy every 5 years or a colonoscopy every 10 years starting at age 31. Hepatitis C blood test. Hepatitis B blood test. Sexually transmitted disease (STD) testing. Diabetes screening. This is done by checking your blood sugar (glucose) after you have not eaten for a while (fasting). You may have this done every 1-3 years. Bone density scan. This is done to screen for osteoporosis. You may have this done starting at age 55. Mammogram. This may be done every 1-2 years. Talk to your health care provider about how often you should have regular mammograms. Talk with your health care provider about your test results, treatment options, and if necessary, the need for more tests. Vaccines  Your health care provider may recommend certain vaccines, such  as: Influenza vaccine. This is recommended every year. Tetanus, diphtheria, and acellular pertussis (Tdap, Td) vaccine. You may need a Td booster every 10 years. Zoster vaccine. You may need this after age 19. Pneumococcal 13-valent conjugate (PCV13) vaccine. One dose is recommended after age 51. Pneumococcal polysaccharide (PPSV23) vaccine. One dose is recommended after age 70. Talk to your health care provider about which screenings and vaccines you need and how often you need them. This information is not intended to replace advice given to you by your health care provider. Make sure you discuss any questions you have with your health care provider. Document Released: 12/01/2015 Document Revised: 07/24/2016 Document Reviewed: 09/05/2015 Elsevier Interactive Patient Education  2017 [place] Prevention in the Home Falls can cause injuries. They can happen to people of all ages. There are many things you can do to make your home safe and to help prevent falls. What can I do on the outside of my home? Regularly fix the edges of walkways and driveways and fix any cracks. Remove anything that might make you trip as you walk through a door, such as a raised step or threshold. Trim any bushes or trees on the path to your home. Use bright outdoor lighting. Clear any walking paths of anything that might make someone trip, such as rocks or tools. Regularly check to see if handrails are loose or broken. Make sure that both sides of any steps have handrails. Any raised decks and porches should have guardrails on the edges. Have any leaves, snow, or ice cleared regularly. Use sand or salt on walking paths during winter. Clean up any spills in your garage right away. This includes oil or grease spills. What can I do in the bathroom? Use night lights. Install grab bars by the toilet and in the tub and shower. Do not use towel bars as grab bars. Use non-skid mats or decals in the tub or  shower. If you need to sit down in the shower, use a plastic, non-slip stool. Keep the floor dry. Clean up any water that spills on the floor as soon as it happens. Remove soap buildup in the tub or shower regularly. Attach bath mats securely with double-sided non-slip rug tape. Do not have throw rugs and other things on the floor that can make you trip. What can I do in the bedroom? Use night lights. Make sure that you have a light by your bed that is easy to reach. Do not use any sheets or blankets that are too big for your bed. They should not hang down onto the floor. Have a firm chair that has side arms. You can use this for support while you get dressed. Do not have throw rugs and other things on the floor that can make you trip. What can I do in the kitchen? Clean up any spills right away. Avoid walking on wet floors. Keep items that you use a lot in easy-to-reach places. If you need to reach something above you, use a strong step stool that has a grab bar. Keep electrical cords out of the way. Do not use floor polish or wax that makes floors slippery. If you must use wax, use non-skid floor wax. Do not have throw rugs and other things on the floor that can make you trip. What can I do with my stairs? Do not leave any items on the stairs. Make sure that there are  handrails on both sides of the stairs and use them. Fix handrails that are broken or loose. Make sure that handrails are as long as the stairways. Check any carpeting to make sure that it is firmly attached to the stairs. Fix any carpet that is loose or worn. Avoid having throw rugs at the top or bottom of the stairs. If you do have throw rugs, attach them to the floor with carpet tape. Make sure that you have a light switch at the top of the stairs and the bottom of the stairs. If you do not have them, ask someone to add them for you. What else can I do to help prevent falls? Wear shoes that: Do not have high heels. Have  rubber bottoms. Are comfortable and fit you well. Are closed at the toe. Do not wear sandals. If you use a stepladder: Make sure that it is fully opened. Do not climb a closed stepladder. Make sure that both sides of the stepladder are locked into place. Ask someone to hold it for you, if possible. Clearly mark and make sure that you can see: Any grab bars or handrails. First and last steps. Where the edge of each step is. Use tools that help you move around (mobility aids) if they are needed. These include: Canes. Walkers. Scooters. Crutches. Turn on the lights when you go into a dark area. Replace any light bulbs as soon as they burn out. Set up your furniture so you have a clear path. Avoid moving your furniture around. If any of your floors are uneven, fix them. If there are any pets around you, be aware of where they are. Review your medicines with your doctor. Some medicines can make you feel dizzy. This can increase your chance of falling. Ask your doctor what other things that you can do to help prevent falls. This information is not intended to replace advice given to you by your health care provider. Make sure you discuss any questions you have with your health care provider. Document Released: 08/31/2009 Document Revised: 04/11/2016 Document Reviewed: 12/09/2014 Elsevier Interactive Patient Education  2017 [employer].

## 2021-05-08 NOTE — Progress Notes (Addendum)
Subjective:   Claudia [name] is a 75 y.o. female who presents for Medicare Annual (Subsequent) preventive examination.  Review of Systems     Cardiac Risk Factors include: advanced age (>52men, >30 women);diabetes mellitus;family history of premature cardiovascular disease;hypertension     Objective:    Today's Vitals   05/08/21 1019 05/08/21 1138  BP:  120/78  Pulse:  91  Temp:  97.7 F (36.5 C)  SpO2:  98%  Weight:  175 lb 9.6 oz (79.7 kg)  Height:  5\' 5"  (1.651 m)  PainSc: 0-No pain 0-No pain   Body mass index is 29.22 kg/m.  Advanced Directives 05/08/2021 07/18/2020 05/15/2020 03/27/2020 05/13/2017 03/17/2017 06/02/2015  Does Patient Have a Medical Advance Directive? No Yes Yes Yes Yes Yes Yes  Type of Advance Directive - [place];Out of facility DNR (pink MOST or yellow form);Living will - Living will;Healthcare Power of [place];Living will [place];Living will Living will  Does patient want to make changes to medical advance directive? - - - No - Patient declined - - No - Patient declined  Copy of [place] in Chart? - - - No - copy requested No - copy requested No - copy requested -  Would patient like information on creating a medical advance directive? No - Patient declined - - - - - -    Current Medications (verified) Outpatient Encounter Medications as of 05/08/2021  Medication Sig   amLODipine (NORVASC) 2.5 MG tablet Take 1 tablet (2.5 mg total) by mouth daily. For your blood pressure   cholecalciferol (VITAMIN D) 1000 units tablet Take 1 tablet by mouth daily.   Cyanocobalamin (VITAMIN B-12) 1000 MCG SUBL Place 1 tablet (1,000 mcg total) under the tongue daily.   DULoxetine (CYMBALTA) 60 MG capsule TAKE 1 CAPSULE EVERY DAY FOR DEPRESSION   gabapentin (NEURONTIN) 600 MG tablet TAKE 1 TABLET THREE TIMES DAILY   metFORMIN (GLUCOPHAGE) 500 MG tablet TAKE 1 TABLET EVERY DAY WITH  BREAKFAST for sugar   olmesartan (BENICAR) 20 MG tablet Take 1 tablet (20 mg total) by mouth daily. Annual appt due in May must see provider for future refills   omeprazole (PRILOSEC) 40 MG capsule Take 1 capsule (40 mg total) by mouth 2 (two) times daily. For your GERD   solifenacin (VESICARE) 5 MG tablet TAKE 1 TABLET EVERY DAY for your bladder   SYRINGE-NEEDLE, DISP, 3 ML (BD ECLIPSE SYRINGE) 25G X 1" 3 ML MISC As directed SQ for B12 shots   No facility-administered encounter medications on file as of 05/08/2021.    Allergies (verified) Aricept [donepezil hcl], Bupropion hcl, Lovastatin, Norco [hydrocodone-acetaminophen], Olanzapine-fluoxetine hcl, Sodium lauryl sulfate, Sulfonamide derivatives, Tramadol, and Tape   History: Past Medical History:  Diagnosis Date   Acute upper respiratory infections of unspecified site    Anxiety state, unspecified    Breast cancer (HCC)    Chest pain, unspecified    Cramp of limb    Depressive disorder, not elsewhere classified    Diverticulitis    pt stated   DM (diabetes mellitus) ([place])    Headache(784.0)    Hiatal hernia    History of cold sores    IBS (irritable bowel syndrome)    Insomnia, unspecified    Internal hemorrhoids without mention of complication    Irritable bowel syndrome    Myalgia and myositis, unspecified    Neuralgia of chest    post op   Neuralgia, neuritis,  and radiculitis, unspecified    Osteoarthritis    Osteoarthrosis, unspecified whether generalized or localized, unspecified site    Other B-complex deficiencies    Other malaise and fatigue    Personal history of malignant neoplasm of breast    Pneumonia    hx of   Restless legs syndrome (RLS)    Seizures ([place])    "mini seizures" 15 yrs. ago. Pt. states did have work up and was normal   Stricture and stenosis of esophagus    Unspecified essential hypertension    Past Surgical History:  Procedure Laterality Date   ABDOMINAL HYSTERECTOMY     breast  reconstruction surgery bilateral - 8 yrs. ago     CHOLECYSTECTOMY     deviated septum surgery x 3     hx. of bell's palsy at 14 yrs. old     MASTECTOMY     bilateral total   Mortons neuroma on right foot between middle toe     rotator cuff surgery -left shoulder- 8 yrs. ago     salva gland removed from left side of neck 60 yrs. ago     TONSILLECTOMY     TOTAL KNEE ARTHROPLASTY Left 06/01/2015   Procedure: LEFT TOTAL KNEE ARTHROPLASTY;  Surgeon: Rod Can, MD;  Location: [facility];  Service: Orthopedics;  Laterality: Left;   Family History  Problem Relation Age of Onset   Diabetes Mother    Kidney disease Mother    Lymphoma Mother    Diabetes Sister    Heart disease Sister    Stroke Sister    Diabetes Brother    Kidney disease Brother    Coronary artery disease Brother    Breast cancer Paternal Grandmother    Coronary artery disease Maternal Grandmother    Social History   Socioeconomic History   Marital status: Married    Spouse name: Not on file   Number of children: 1   Years of education: 12   Highest education level: Not on file  Occupational History   Occupation: Retired  Tobacco Use   Smoking status: Former    Pack years: 0.00    Types: Cigarettes    Quit date: 11/26/1971    Years since quitting: 49.4   Smokeless tobacco: Never  Vaping Use   Vaping Use: Never used  Substance and Sexual Activity   Alcohol use: No   Drug use: No   Sexual activity: Yes  Other Topics Concern   Not on file  Social History Narrative   Right handed   Drinks caffeine   Two story home   Senior [employer] - Retired again 2011      Regular exercise - YES, walking      Family history   B MG, CAD   Social Determinants of Radio broadcast assistant Strain: Low Risk    Difficulty of Paying Living Expenses: Not hard at all  Food Insecurity: No Food Insecurity   Worried About Charity fundraiser in the Last Year: Never true   Arboriculturist in the Last Year: Never true   Transportation Needs: No Transportation Needs   Lack of Transportation (Medical): No   Lack of Transportation (Non-Medical): No  Physical Activity: Sufficiently Active   Days of Exercise per Week: 5 days   Minutes of Exercise per Session: 30 min  Stress: No Stress Concern Present   Feeling of Stress : Not at all  Social Connections: Socially Integrated   Frequency of Communication with  Friends and Family: More than three times a week   Frequency of Social Gatherings with Friends and Family: More than three times a week   Attends Religious Services: More than 4 times per year   Active Member of [employer] or Organizations: Yes   Attends Music therapist: More than 4 times per year   Marital Status: Married    Tobacco Counseling Counseling given: Not Answered   Clinical Intake:  Pre-visit preparation completed: Yes  Pain : No/denies pain Pain Score: 0-No pain     BMI - recorded: 29.22 Nutritional Status: BMI 25 -29 Overweight Nutritional Risks: None Diabetes: Yes CBG done?: No Did pt. bring in CBG monitor from home?: No  How often do you need to have someone help you when you read instructions, pamphlets, or other written materials from your doctor or pharmacy?: 1 - Never What is the last grade level you completed in school?: High School Graduate; Retired from [employer]  Diabetic? yes  Interpreter Needed?: No  Information entered by :: [employer], LPN.   Activities of Daily Living In your present state of health, do you have any difficulty performing the following activities: 05/08/2021  Hearing? N  Vision? N  Difficulty concentrating or making decisions? N  Walking or climbing stairs? N  Dressing or bathing? N  Doing errands, shopping? N  Preparing Food and eating ? N  Using the Toilet? N  In the past six months, have you accidently leaked urine? N  Do you have problems with loss of bowel control? N  Managing your Medications? N   Managing your Finances? N  Housekeeping or managing your Housekeeping? N  Some recent data might be hidden    Patient Care Team: Plotnikov, Evie Lacks, MD as PCP - General [place], Aaron Edelman, MD as Consulting Physician (Orthopedic Surgery) Marybelle Killings, MD as Consulting Physician (Orthopedic Surgery) Cameron Sprang, MD as Consulting Physician (Neurology)  Indicate any recent Medical Services you may have received from other than [facility] providers in the past year (date may be approximate).     Assessment:   This is a routine wellness examination for [place].  Hearing/Vision screen Hearing Screening - Comments:: Patient denies any hearing difficulty. Vision Screening - Comments:: Patient wears glasses.  Eye checked once a year by [facility].  Dietary issues and exercise activities discussed: Current Exercise Habits: Home exercise routine, Type of exercise: walking, Time (Minutes): 30, Frequency (Times/Week): 5, Weekly Exercise (Minutes/Week): 150, Intensity: Moderate, Exercise limited by: None identified   Goals Addressed             This Visit's Progress    Patient Stated       Start being more physically active by walking more.       Depression Screen PHQ 2/9 Scores 05/08/2021 03/28/2021 12/27/2020 03/27/2020 05/13/2017 01/24/2017  PHQ - 2 Score 0 0 0 4 2 0  PHQ- 9 Score - 0 0 16 8 -    Fall Risk Fall Risk  05/08/2021 07/18/2020 05/15/2020 03/27/2020 08/28/2018  Falls in the past year? 1 1 1 1  Yes  Number falls in past yr: 1 1 1  0 1  Injury with Fall? 1 1 1 1  Yes  Risk Factor Category  - - - - -  Risk for fall due to : History of fall(s) - - Impaired balance/gait;Impaired vision;Orthopedic patient -  Follow up Falls evaluation completed - - Falls evaluation completed;Education provided Education provided    [place]  TO THE HOME:  Any stairs in or around the home? Yes  If so, are there any without handrails? No  Home free of loose throw rugs in  walkways, pet beds, electrical cords, etc? Yes  Adequate lighting in your home to reduce risk of falls? Yes   ASSISTIVE DEVICES UTILIZED TO PREVENT FALLS:  Life alert? No  Use of a cane, walker or w/c? No  Grab bars in the bathroom? Yes  Shower chair or bench in shower? Yes  Elevated toilet seat or a handicapped toilet? No   TIMED UP AND GO:  Was the test performed? Yes .  Length of time to ambulate 10 feet: 6 sec.   Gait steady and fast without use of assistive device  Cognitive Function: Normal cognitive status assessed by direct observation by this Nurse Health Advisor. No abnormalities found.   MMSE - Mini Mental State Exam 05/13/2017  Orientation to time 5  Orientation to Place 5  Registration 3  Attention/ Calculation 5  Recall 2  Language- name 2 objects 2  Language- repeat 1  Language- follow 3 step command 3  Language- read & follow direction 1  Write a sentence 1  Copy design 1  Total score 29   Montreal Cognitive Assessment  06/14/2020  Visuospatial/ Executive (0/5) 3  Naming (0/3) 3  Attention: Read list of digits (0/2) 2  Attention: Read list of letters (0/1) 1  Attention: Serial 7 subtraction starting at 100 (0/3) 3  Language: Repeat phrase (0/2) 0  Language : Fluency (0/1) 0  Abstraction (0/2) 2  Delayed Recall (0/5) 3  Orientation (0/6) 6  Total 23  Adjusted Score (based on education) 24   6CIT Screen 03/27/2020  What Year? 0 points  What month? 0 points  What time? 0 points  Count back from 20 0 points  Months in reverse 0 points  Repeat phrase 2 points  Total Score 2    Immunizations Immunization History  Administered Date(s) Administered   Fluad Quad(high Dose 65+) 09/09/2020   H1N1 11/01/2008   Influenza Split 08/20/2011   Influenza Whole 09/30/2002, 08/16/2008, 07/16/2010   Influenza, High Dose Seasonal PF 10/02/2015, 10/01/2018, 10/02/2019   Influenza-Unspecified 08/20/2016   PFIZER(Purple Top)SARS-COV-2 Vaccination 12/07/2019,  12/28/2019, 09/09/2020   Pneumococcal Conjugate-13 11/02/2014   Pneumococcal Polysaccharide-23 10/23/2011   Tdap 10/23/2011    TDAP status: Up to date  Flu Vaccine status: Up to date  Pneumococcal vaccine status: Up to date  Covid-19 vaccine status: Completed vaccines  Qualifies for Shingles Vaccine? Yes   Zostavax completed No   Shingrix Completed?: No.    Education has been provided regarding the importance of this vaccine. Patient has been advised to call insurance company to determine out of pocket expense if they have not yet received this vaccine. Advised may also receive vaccine at local pharmacy or Health Dept. Verbalized acceptance and understanding.  Screening Tests Health Maintenance  Topic Date Due   FOOT EXAM  Never done   Hepatitis C Screening  Never done   Zoster Vaccines- Shingrix (1 of 2) Never done   OPHTHALMOLOGY EXAM  07/28/2019   COVID-19 Vaccine (4 - Booster for Pfizer series) 12/10/2020   HEMOGLOBIN A1C  03/26/2021   INFLUENZA VACCINE  06/18/2021   TETANUS/TDAP  10/22/2021   COLONOSCOPY (Pts 45-76yrs Insurance coverage will need to be confirmed)  03/17/2022   DEXA SCAN  Completed   PNA vac Low Risk Adult  Completed   HPV VACCINES  Aged Out  Health Maintenance  Health Maintenance Due  Topic Date Due   FOOT EXAM  Never done   Hepatitis C Screening  Never done   Zoster Vaccines- Shingrix (1 of 2) Never done   OPHTHALMOLOGY EXAM  07/28/2019   COVID-19 Vaccine (4 - Booster for Pfizer series) 12/10/2020   HEMOGLOBIN A1C  03/26/2021    Colorectal cancer screening: Type of screening: Colonoscopy. Completed 03/17/2017. Repeat every 5 years  Mammogram status: No longer required due to history of breast cancer.  Bone Density status: Completed 04/25/2020. Results reflect: Bone density results: OSTEOPENIA. Repeat every 2 years.  Lung Cancer Screening: (Low Dose CT Chest recommended if Age 91-80 years, 30 pack-year currently smoking OR have quit w/in  15years.) does not qualify.   Lung Cancer Screening Referral: no  Additional Screening:  Hepatitis C Screening: does not qualify; Completed no  Vision Screening: Recommended annual ophthalmology exams for early detection of glaucoma and other disorders of the eye. Is the patient up to date with their annual eye exam?  Yes  Who is the provider or what is the name of the office in which the patient attends annual eye exams? [facility] If pt is not established with a provider, would they like to be referred to a provider to establish care? No .   Dental Screening: Recommended annual dental exams for proper oral hygiene  Community Resource Referral / Chronic Care Management: CRR required this visit?  No   CCM required this visit?  No      Plan:     I have personally reviewed and noted the following in the patient's chart:   Medical and social history Use of alcohol, tobacco or illicit drugs  Current medications and supplements including opioid prescriptions.  Functional ability and status Nutritional status Physical activity Advanced directives List of other physicians Hospitalizations, surgeries, and ER visits in previous 12 months Vitals Screenings to include cognitive, depression, and falls Referrals and appointments  In addition, I have reviewed and discussed with patient certain preventive protocols, quality metrics, and best practice recommendations. A written personalized care plan for preventive services as well as general preventive health recommendations were provided to patient.     Sheral Flow, LPN   9/37/9024   Nurse Notes: n/a   Medical screening examination/treatment/procedure(s) were performed by non-physician practitioner and as supervising physician I was immediately available for consultation/collaboration.  I agree with above. Lew Dawes, MD

## 2021-05-15 DIAGNOSIS — L905 Scar conditions and fibrosis of skin: Secondary | ICD-10-CM | POA: Diagnosis not present

## 2021-05-15 DIAGNOSIS — C44529 Squamous cell carcinoma of skin of other part of trunk: Secondary | ICD-10-CM | POA: Diagnosis not present

## 2021-06-28 ENCOUNTER — Ambulatory Visit: Payer: Medicare HMO | Admitting: Internal Medicine

## 2021-07-18 ENCOUNTER — Encounter: Payer: Medicare HMO | Admitting: Counselor

## 2021-07-26 ENCOUNTER — Encounter: Payer: Medicare HMO | Admitting: Counselor
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
## 2021-07-30 ENCOUNTER — Ambulatory Visit: Payer: Medicare HMO | Admitting: Neurology

## 2021-07-30 ENCOUNTER — Ambulatory Visit: Payer: Medicare HMO | Admitting: Physician Assistant

## 2021-07-30 ENCOUNTER — Other Ambulatory Visit: Payer: Self-pay

## 2021-07-30 ENCOUNTER — Encounter: Payer: Self-pay | Admitting: Physician Assistant

## 2021-07-30 VITALS — BP 114/69 | HR 102 | Resp 18 | Ht 67.0 in | Wt 174.0 lb

## 2021-07-30 DIAGNOSIS — G3184 Mild cognitive impairment, so stated: Secondary | ICD-10-CM | POA: Diagnosis not present

## 2021-07-30 MED ORDER — DONEPEZIL HCL 10 MG PO TABS: ORAL_TABLET | ORAL | 3 refills | Status: DC

## 2021-07-30 NOTE — Patient Instructions (Addendum)
It was a pleasure to see you today at our office.   Recommendations:   Keep the Neurocognitive evaluation at our office in March 2023 Follow up with [facility].  We will start donepezil half tablet ('5mg'$ ) daily for 2  weeks.  If you are tolerating the medication, then after 2 weeks, we will increase the dose to a full tablet of 10 mg daily.  Side effects include nausea, vomiting, diarrhea, vivid dreams, and muscle cramps.  Please call the clinic if you experience any of these symptoms.  Follow up once the results of the above are available   RECOMMENDATIONS FOR ALL PATIENTS WITH MEMORY PROBLEMS: 1. Continue to exercise (Recommend 30 minutes of walking everyday, or 3 hours every week) 2. Increase social interactions - continue going to [place] and enjoy social gatherings with friends and family 3. Eat healthy, avoid fried foods and eat more fruits and vegetables 4. Maintain adequate blood pressure, blood sugar, and blood cholesterol level. Reducing the risk of stroke and cardiovascular disease also helps promoting better memory. 5. Avoid stressful situations. Live a simple life and avoid aggravations. Organize your time and prepare for the next day in anticipation. 6. Sleep well, avoid any interruptions of sleep and avoid any distractions in the bedroom that may interfere with adequate sleep quality 7. Avoid sugar, avoid sweets as there is a strong link between excessive sugar intake, diabetes, and cognitive impairment We discussed the Mediterranean diet, which has been shown to help patients reduce the risk of progressive memory disorders and reduces cardiovascular risk. This includes eating fish, eat fruits and green leafy vegetables, nuts like almonds and hazelnuts, walnuts, and also use olive oil. Avoid fast foods and fried foods as much as possible. Avoid sweets and sugar as sugar use has been linked to worsening of memory function.  There is always a concern of gradual progression of memory  problems. If this is the case, then we may need to adjust level of care according to patient needs. Support, both to the patient and caregiver, should then be put into place.      You have been referred for a neuropsychological evaluation (i.e., evaluation of memory and thinking abilities). Please bring someone with you to this appointment if possible, as it is helpful for the doctor to hear from both you and another adult who knows you well. Please bring eyeglasses and hearing aids if you wear them.    The evaluation will take approximately 3 hours and has two parts:   The first part is a clinical interview with the neuropsychologist (Dr. Melvyn Novas or Dr. Nicole Kindred). During the interview, the neuropsychologist will speak with you and the individual you brought to the appointment.    The second part of the evaluation is testing with the doctor's technician Hinton Dyer or Maudie Mercury). During the testing, the technician will ask you to remember different types of material, solve problems, and answer some questionnaires. Your family member will not be present for this portion of the evaluation.   Please note: We must reserve several hours of the neuropsychologist's time and the psychometrician's time for your evaluation appointment. As such, there is a No-Show fee of $100. If you are unable to attend any of your appointments, please contact our office as soon as possible to reschedule.    FALL PRECAUTIONS: Be cautious when walking. Scan the area for obstacles that may increase the risk of trips and falls. When getting up in the mornings, sit up at the edge of the bed  for a few minutes before getting out of bed. Consider elevating the bed at the head end to avoid drop of blood pressure when getting up. Walk always in a well-lit room (use night lights in the walls). Avoid area rugs or power cords from appliances in the middle of the walkways. Use a walker or a cane if necessary and consider physical therapy for balance  exercise. Get your eyesight checked regularly.  FINANCIAL OVERSIGHT: Supervision, especially oversight when making financial decisions or transactions is also recommended.  HOME SAFETY: Consider the safety of the kitchen when operating appliances like stoves, microwave oven, and blender. Consider having supervision and share cooking responsibilities until no longer able to participate in those. Accidents with firearms and other hazards in the house should be identified and addressed as well.   ABILITY TO BE LEFT ALONE: If patient is unable to contact 911 operator, consider using LifeLine, or when the need is there, arrange for someone to stay with patients. Smoking is a fire hazard, consider supervision or cessation. Risk of wandering should be assessed by caregiver and if detected at any point, supervision and safe proof recommendations should be instituted.  MEDICATION SUPERVISION: Inability to self-administer medication needs to be constantly addressed. Implement a mechanism to ensure safe administration of the medications.   DRIVING: Regarding driving, in patients with progressive memory problems, driving will be impaired. We advise to have someone else do the driving if trouble finding directions or if minor accidents are reported. Independent driving assessment is available to determine safety of driving.       Mediterranean Diet A Mediterranean diet refers to food and lifestyle choices that are based on the traditions of countries located on the [employer]. This way of eating has been shown to help prevent certain conditions and improve outcomes for people who have chronic diseases, like kidney disease and heart disease. What are tips for following this plan? Lifestyle  Cook and eat meals together with your family, when possible. Drink enough fluid to keep your urine clear or pale yellow. Be physically active every day. This includes: Aerobic exercise like running or  swimming. Leisure activities like gardening, walking, or housework. Get 7-8 hours of sleep each night. If recommended by your health care provider, drink red wine in moderation. This means 1 glass a day for nonpregnant women and 2 glasses a day for men. A glass of wine equals 5 oz (150 mL). Reading food labels  Check the serving size of packaged foods. For foods such as rice and pasta, the serving size refers to the amount of cooked product, not dry. Check the total fat in packaged foods. Avoid foods that have saturated fat or trans fats. Check the ingredients list for added sugars, such as corn syrup. Shopping  At the grocery store, buy most of your food from the areas near the walls of the store. This includes: Fresh fruits and vegetables (produce). Grains, beans, nuts, and seeds. Some of these may be available in unpackaged forms or large amounts (in bulk). Fresh seafood. Poultry and eggs. Low-fat dairy products. Buy whole ingredients instead of prepackaged foods. Buy fresh fruits and vegetables in-season from local farmers markets. Buy frozen fruits and vegetables in resealable bags. If you do not have access to quality fresh seafood, buy precooked frozen shrimp or canned fish, such as tuna, salmon, or sardines. Buy small amounts of raw or cooked vegetables, salads, or olives from the deli or salad bar at your store. Stock Conservator, museum/gallery so  you always have certain foods on hand, such as olive oil, canned tuna, canned tomatoes, rice, pasta, and beans. Cooking  Cook foods with extra-virgin olive oil instead of using butter or other vegetable oils. Have meat as a side dish, and have vegetables or grains as your main dish. This means having meat in small portions or adding small amounts of meat to foods like pasta or stew. Use beans or vegetables instead of meat in common dishes like chili or lasagna. Experiment with different cooking methods. Try roasting or broiling vegetables instead of  steaming or sauteing them. Add frozen vegetables to soups, stews, pasta, or rice. Add nuts or seeds for added healthy fat at each meal. You can add these to yogurt, salads, or vegetable dishes. Marinate fish or vegetables using olive oil, lemon juice, garlic, and fresh herbs. Meal planning  Plan to eat 1 vegetarian meal one day each week. Try to work up to 2 vegetarian meals, if possible. Eat seafood 2 or more times a week. Have healthy snacks readily available, such as: Vegetable sticks with hummus. Greek yogurt. Fruit and nut trail mix. Eat balanced meals throughout the week. This includes: Fruit: 2-3 servings a day Vegetables: 4-5 servings a day Low-fat dairy: 2 servings a day Fish, poultry, or lean meat: 1 serving a day Beans and legumes: 2 or more servings a week Nuts and seeds: 1-2 servings a day Whole grains: 6-8 servings a day Extra-virgin olive oil: 3-4 servings a day Limit red meat and sweets to only a few servings a month What are my food choices? Mediterranean diet Recommended Grains: Whole-grain pasta. Brown rice. Bulgar wheat. Polenta. Couscous. Whole-wheat bread. [place]. Vegetables: Artichokes. Beets. Broccoli. Cabbage. Carrots. Eggplant. Green beans. Chard. Kale. Spinach. Onions. Leeks. Peas. Squash. Tomatoes. Peppers. Radishes. Fruits: Apples. Apricots. Avocado. Berries. Bananas. Cherries. Dates. Figs. Grapes. Lemons. Melon. Oranges. Peaches. Plums. Pomegranate. Meats and other protein foods: Beans. Almonds. Sunflower seeds. Pine nuts. Peanuts. [place]. Salmon. Scallops. Shrimp. [place]. Tilapia. Clams. Oysters. Eggs. Dairy: Low-fat milk. Cheese. Greek yogurt. Beverages: Water. Red wine. Herbal tea. Fats and oils: Extra virgin olive oil. Avocado oil. Grape seed oil. Sweets and desserts: [place] yogurt with honey. Baked apples. Poached pears. Trail mix. Seasoning and other foods: Basil. Cilantro. Coriander. Cumin. Mint. Parsley. Sage. Rosemary. Tarragon. Garlic.  Oregano. Thyme. Pepper. Balsalmic vinegar. Tahini. Hummus. Tomato sauce. Olives. Mushrooms. Limit these Grains: Prepackaged pasta or rice dishes. Prepackaged cereal with added sugar. Vegetables: Deep fried potatoes (french fries). Fruits: Fruit canned in syrup. Meats and other protein foods: Beef. Pork. Lamb. Poultry with skin. Hot dogs. [place]ce Salines. Dairy: Ice cream. Sour cream. Whole milk. Beverages: Juice. Sugar-sweetened soft drinks. Beer. Liquor and spirits. Fats and oils: Butter. Canola oil. Vegetable oil. Beef fat (tallow). Lard. Sweets and desserts: Cookies. Cakes. Pies. Candy. Seasoning and other foods: Mayonnaise. Premade sauces and marinades. The items listed may not be a complete list. Talk with your dietitian about what dietary choices are right for you. Summary The Mediterranean diet includes both food and lifestyle choices. Eat a variety of fresh fruits and vegetables, beans, nuts, seeds, and whole grains. Limit the amount of red meat and sweets that you eat. Talk with your health care provider about whether it is safe for you to drink red wine in moderation. This means 1 glass a day for nonpregnant women and 2 glasses a day for men. A glass of wine equals 5 oz (150 mL). This information is not intended to replace advice given to you by  your health care provider. Make sure you discuss any questions you have with your health care provider. Document Released: 06/27/2016 Document Revised: 07/30/2016 Document Reviewed: 06/27/2016 Elsevier Interactive Patient Education  2017 [employer].

## 2021-10-18 ENCOUNTER — Encounter: Payer: Self-pay | Admitting: Psychology

## 2021-10-19 ENCOUNTER — Other Ambulatory Visit: Payer: Self-pay | Admitting: Internal Medicine

## 2021-10-25 ENCOUNTER — Other Ambulatory Visit: Payer: Self-pay | Admitting: Internal Medicine

## 2021-11-14 ENCOUNTER — Encounter: Payer: Medicare HMO | Admitting: Psychology

## 2021-11-15 ENCOUNTER — Ambulatory Visit (INDEPENDENT_AMBULATORY_CARE_PROVIDER_SITE_OTHER): Payer: Medicare HMO | Admitting: Internal Medicine

## 2021-11-15 ENCOUNTER — Encounter: Payer: Self-pay | Admitting: Internal Medicine

## 2021-11-15 ENCOUNTER — Emergency Department (HOSPITAL_COMMUNITY): Payer: Medicare HMO

## 2021-11-15 ENCOUNTER — Emergency Department (HOSPITAL_COMMUNITY)
Admission: EM | Admit: 2021-11-15 | Discharge: 2021-11-16 | Disposition: A | Discharge status: Home / Self Care | Payer: Medicare HMO | Attending: Emergency Medicine | Admitting: Emergency Medicine

## 2021-11-15 ENCOUNTER — Other Ambulatory Visit: Payer: Self-pay

## 2021-11-15 VITALS — BP 126/78 | HR 86 | Temp 99.0°F | Ht 67.0 in | Wt 175.0 lb

## 2021-11-15 DIAGNOSIS — Z79899 Other long term (current) drug therapy: Secondary | ICD-10-CM | POA: Insufficient documentation

## 2021-11-15 DIAGNOSIS — Z853 Personal history of malignant neoplasm of breast: Secondary | ICD-10-CM | POA: Diagnosis not present

## 2021-11-15 DIAGNOSIS — G3184 Mild cognitive impairment, so stated: Secondary | ICD-10-CM | POA: Diagnosis not present

## 2021-11-15 DIAGNOSIS — I499 Cardiac arrhythmia, unspecified: Secondary | ICD-10-CM | POA: Diagnosis not present

## 2021-11-15 DIAGNOSIS — I4892 Unspecified atrial flutter: Secondary | ICD-10-CM | POA: Diagnosis not present

## 2021-11-15 DIAGNOSIS — Z23 Encounter for immunization: Secondary | ICD-10-CM | POA: Diagnosis not present

## 2021-11-15 DIAGNOSIS — R42 Dizziness and giddiness: Secondary | ICD-10-CM | POA: Diagnosis not present

## 2021-11-15 DIAGNOSIS — Z96652 Presence of left artificial knee joint: Secondary | ICD-10-CM | POA: Insufficient documentation

## 2021-11-15 DIAGNOSIS — Z7984 Long term (current) use of oral hypoglycemic drugs: Secondary | ICD-10-CM | POA: Insufficient documentation

## 2021-11-15 DIAGNOSIS — F329 Major depressive disorder, single episode, unspecified: Secondary | ICD-10-CM | POA: Diagnosis not present

## 2021-11-15 DIAGNOSIS — R0602 Shortness of breath: Secondary | ICD-10-CM | POA: Diagnosis not present

## 2021-11-15 DIAGNOSIS — Z87891 Personal history of nicotine dependence: Secondary | ICD-10-CM | POA: Insufficient documentation

## 2021-11-15 DIAGNOSIS — I10 Essential (primary) hypertension: Secondary | ICD-10-CM | POA: Insufficient documentation

## 2021-11-15 DIAGNOSIS — E538 Deficiency of other specified B group vitamins: Secondary | ICD-10-CM

## 2021-11-15 DIAGNOSIS — E119 Type 2 diabetes mellitus without complications: Secondary | ICD-10-CM | POA: Insufficient documentation

## 2021-11-15 LAB — CBC
HCT: 46.7 % — ABNORMAL HIGH (ref 36.0–46.0)
Hemoglobin: 15.3 g/dL — ABNORMAL HIGH (ref 12.0–15.0)
MCH: 30.5 pg (ref 26.0–34.0)
MCHC: 32.8 g/dL (ref 30.0–36.0)
MCV: 93.2 fL (ref 80.0–100.0)
Platelets: 271 10*3/uL (ref 150–400)
RBC: 5.01 MIL/uL (ref 3.87–5.11)
RDW: 13.2 % (ref 11.5–15.5)
WBC: 7.5 10*3/uL (ref 4.0–10.5)
nRBC: 0 % (ref 0.0–0.2)

## 2021-11-15 LAB — BASIC METABOLIC PANEL
Anion gap: 8 (ref 5–15)
BUN: 19 mg/dL (ref 8–23)
CO2: 23 mmol/L (ref 22–32)
Calcium: 9.2 mg/dL (ref 8.9–10.3)
Chloride: 109 mmol/L (ref 98–111)
Creatinine, Ser: 1.33 mg/dL — ABNORMAL HIGH (ref 0.44–1.00)
GFR, Estimated: 41 mL/min — ABNORMAL LOW (ref >60)
Glucose, Bld: 106 mg/dL — ABNORMAL HIGH (ref 70–99)
Potassium: 3.9 mmol/L (ref 3.5–5.1)
Sodium: 140 mmol/L (ref 135–145)

## 2021-11-15 LAB — TROPONIN I (HIGH SENSITIVITY)
Troponin I (High Sensitivity): 13 ng/L (ref <18)
Troponin I (High Sensitivity): 15 ng/L (ref <18)

## 2021-11-15 MED ORDER — DONEPEZIL HCL 10 MG PO TABS: ORAL_TABLET | ORAL | 3 refills | Status: DC

## 2021-11-15 MED ORDER — CITALOPRAM HYDROBROMIDE 20 MG PO TABS: 20.0000 mg | ORAL_TABLET | Freq: Every day | ORAL | 3 refills | Status: DC

## 2021-11-15 MED ORDER — VITAMIN B-12 1000 MCG SL SUBL: 1.0000 | SUBLINGUAL_TABLET | Freq: Every day | SUBLINGUAL | 3 refills | Status: AC

## 2021-11-15 NOTE — ED Triage Notes (Signed)
Pt here from MD with several complaints. Abnormal EKG with dizziness , SOB and headache. Intermitted dementia. Family with pt.  Headache in triage. 9/10

## 2021-11-15 NOTE — Progress Notes (Signed)
Patient ID: Claudia [name], female   DOB: [birth year], 75 y.o.   MRN: [id number]        Chief Complaint: follow up with daughter for checkup       HPI:  Claudia [name] is a 75 y.o. female here with daughter who is concerned mother is overall not doing well but not sure how; She sees mother as flat affect, HA, depressed, bored, diziness and worsening ST memory, but o/w hard to be more specific.  Pt denies increased sob or doe, wheezing, orthopnea, PND, increased LE swelling, palpitations, or syncope.   Pt is ok with trying to change cymbalta she has been taking for quite some time to see if can do better.  Not taking B12 now, but willing to restart.    Pt denies polydipsia, polyuria, or new focal neuro s/s.   Pt denies fever, wt loss, night sweats, loss of appetite, or other constitutional symptoms     Wt Readings from Last 3 Encounters:  11/15/21 174 lb 2.6 oz (79 kg)  11/15/21 175 lb (79.4 kg)  07/30/21 174 lb (78.9 kg)   BP Readings from Last 3 Encounters:  11/16/21 (!) 146/87  11/15/21 126/78  07/30/21 114/69         Past Medical History:  Diagnosis Date   Acute upper respiratory infections of unspecified site    Anxiety state, unspecified    Breast cancer ([place])    Chest pain, unspecified    Cramp of limb    Depressive disorder, not elsewhere classified    Diverticulitis    pt stated   DM (diabetes mellitus) ([place])    Headache(784.0)    Hiatal hernia    History of cold sores    IBS (irritable bowel syndrome)    Insomnia, unspecified    Internal hemorrhoids without mention of complication    Irritable bowel syndrome    Myalgia and myositis, unspecified    Neuralgia of chest    post op   Neuralgia, neuritis, and radiculitis, unspecified    Osteoarthritis    Osteoarthrosis, unspecified whether generalized or localized, unspecified site    Other B-complex deficiencies    Other malaise and fatigue    Personal history of malignant neoplasm of breast    Pneumonia    hx of    Restless legs syndrome (RLS)    Seizures ([place])    "mini seizures" 15 yrs. ago. Pt. states did have work up and was normal   Stricture and stenosis of esophagus    Unspecified essential hypertension    Past Surgical History:  Procedure Laterality Date   ABDOMINAL HYSTERECTOMY     breast reconstruction surgery bilateral - 8 yrs. ago     CHOLECYSTECTOMY     deviated septum surgery x 3     hx. of bell's palsy at 14 yrs. old     MASTECTOMY     bilateral total   Mortons neuroma on right foot between middle toe     rotator cuff surgery -left shoulder- 8 yrs. ago     salva gland removed from left side of neck 60 yrs. ago     TONSILLECTOMY     TOTAL KNEE ARTHROPLASTY Left 06/01/2015   Procedure: LEFT TOTAL KNEE ARTHROPLASTY;  Surgeon: Rod Can, MD;  Location: [facility];  Service: Orthopedics;  Laterality: Left;    reports that she quit smoking about 50 years ago. Her smoking use included cigarettes. She has never used smokeless tobacco. She reports that she does  not drink alcohol and does not use drugs. family history includes Breast cancer in her paternal grandmother; Coronary artery disease in her brother and maternal grandmother; Diabetes in her brother, mother, and sister; Heart disease in her sister; Kidney disease in her brother and mother; Lymphoma in her mother; Stroke in her sister. Allergies  Allergen Reactions   Aricept [Donepezil Hcl]     Dry mouth, cramps   Bupropion Hcl     REACTION: CP   Lovastatin     REACTION: myalgial   Norco [Hydrocodone-Acetaminophen] Itching    intolerant   Olanzapine-Fluoxetine Hcl     REACTION: Leg swelling   Sodium Lauryl Sulfate    Sulfonamide Derivatives Hives and Itching   Tramadol     itching   Tape Rash    Latex tape   Current Outpatient Medications on File Prior to Visit  Medication Sig Dispense Refill   amLODipine (NORVASC) 2.5 MG tablet Take 1 tablet (2.5 mg total) by mouth daily. For your blood pressure 90 tablet 3    cholecalciferol (VITAMIN D) 1000 units tablet Take 1 tablet by mouth daily.     gabapentin (NEURONTIN) 600 MG tablet TAKE 1 TABLET THREE TIMES DAILY (Patient taking differently: Take 600 mg by mouth 3 (three) times daily.) 270 tablet 3   metFORMIN (GLUCOPHAGE) 500 MG tablet TAKE 1 TABLET EVERY DAY WITH BREAKFAST (Patient taking differently: Take 500 mg by mouth daily with breakfast. TAKE 1 TABLET EVERY DAY WITH BREAKFAST) 90 tablet 3   olmesartan (BENICAR) 20 MG tablet Take 1 tablet (20 mg total) by mouth daily. Annual appt due in May must see provider for future refills 90 tablet 0   omeprazole (PRILOSEC) 40 MG capsule Take 1 capsule (40 mg total) by mouth 2 (two) times daily. For your GERD 180 capsule 3   solifenacin (VESICARE) 5 MG tablet TAKE 1 TABLET EVERY DAY (Patient taking differently: Take 5 mg by mouth daily. TAKE 1 TABLET EVERY DAY) 90 tablet 0   SYRINGE-NEEDLE, DISP, 3 ML (BD ECLIPSE SYRINGE) 25G X 1" 3 ML MISC As directed SQ for B12 shots 50 each 3   No current facility-administered medications on file prior to visit.        ROS:  All others reviewed and negative.  Objective        PE:  BP 126/78 (BP Location: Right Arm, Patient Position: Sitting, Cuff Size: Large)    Pulse 86    Temp 99 F (37.2 C) (Oral)    Ht 5\' 7"  (1.702 m)    Wt 175 lb (79.4 kg)    SpO2 99%    BMI 27.41 kg/m                 Constitutional: Pt appears in NAD               HENT: Head: NCAT.                Right Ear: External ear normal.                 Left Ear: External ear normal.                Eyes: . Pupils are equal, round, and reactive to light. Conjunctivae and EOM are normal               Nose: without d/c or deformity               Neck: Neck supple. Gross normal  ROM               Cardiovascular: Normal rate and irregular rhythm.                 Pulmonary/Chest: Effort normal and breath sounds without rales or wheezing.                Abd:  Soft, NT, ND, + BS, no organomegaly                Neurological: Pt is alert. At baseline orientation, motor grossly intact               Skin: Skin is warm. No rashes, no other new lesions, LE edema - none               Psychiatric: Pt behavior is normal without agitation but flat affect  Micro: none  Cardiac tracings I have personally interpreted today:  ECG - atrial flutter 81  Pertinent Radiological findings (summarize): none   Lab Results  Component Value Date   WBC 7.5 11/15/2021   HGB 15.3 (H) 11/15/2021   HCT 46.7 (H) 11/15/2021   PLT 271 11/15/2021   GLUCOSE 106 (H) 11/15/2021   CHOL 200 09/26/2020   TRIG 120.0 09/26/2020   HDL 41.20 09/26/2020   LDLCALC 135 (H) 09/26/2020   ALT 9 03/28/2021   AST 15 03/28/2021   NA 140 11/15/2021   K 3.9 11/15/2021   CL 109 11/15/2021   CREATININE 1.33 (H) 11/15/2021   BUN 19 11/15/2021   CO2 23 11/15/2021   TSH 4.18 09/26/2020   INR 1.02 05/26/2015   HGBA1C 6.3 09/26/2020   Assessment/Plan:  Britania B Mcnew is a 75 y.o. White or Caucasian [1] female with  has a past medical history of Acute upper respiratory infections of unspecified site, Anxiety state, unspecified, Breast cancer ([place]), Chest pain, unspecified, Cramp of limb, Depressive disorder, not elsewhere classified, Diverticulitis, DM (diabetes mellitus) ([place]), Headache(784.0), Hiatal hernia, History of cold sores, IBS (irritable bowel syndrome), Insomnia, unspecified, Internal hemorrhoids without mention of complication, Irritable bowel syndrome, Myalgia and myositis, unspecified, Neuralgia of chest, Neuralgia, neuritis, and radiculitis, unspecified, Osteoarthritis, Osteoarthrosis, unspecified whether generalized or localized, unspecified site, Other B-complex deficiencies, Other malaise and fatigue, Personal history of malignant neoplasm of breast, Pneumonia, Restless legs syndrome (RLS), Seizures ([place]), Stricture and stenosis of esophagus, and Unspecified essential hypertension.  Depression With mild worsening, for change  cymbalta to celexa 20 qd  Irregular heart beats New onset, unclear how long atrial flutter has been present, possibly accounts for her dizziness or other vague symptoms; volume stable, but will need to go to ED now  Mild cognitive impairment ? Mild worsening even in the past mo, but possiblly related to atrial flutter, so for contd currnet med tx aricept and f/u neurology as planned feb 2023  B12 deficiency Lab Results  Component Value Date   VITAMINB12 1,044 (H) 03/28/2021   Stable, cont oral replacement - b12 1000 mcg qd  Followup: Return in about 4 weeks (around 12/13/2021).  Cathlean Cower, MD 11/16/2021 1:38 PM [facility]

## 2021-11-15 NOTE — Patient Instructions (Addendum)
Your EKG was done today - this is c/w atrial flutter - please go to ED now  [facility] to [place] to START the Celexa 20 mg per day when arrives in mail  [place] to take B12 as prescribed  Please continue all other medications as before, and refills have been done if requested.  Please have the pharmacy call with any other refills you may need.  Please continue your efforts at being more active, low cholesterol diet, and weight control.  Please keep your appointments with your specialists as you may have planned - Neurology in Feb 2023  Please go to the [facility] at [place] either today before 530PM, or ANY other day at your convenience for the blood tests that have been already ordered per Dr Alain Marion which would include the thyroid testing  Please see Dr Alain Marion in 1-2 months or sooner if needed

## 2021-11-16 ENCOUNTER — Encounter: Payer: Self-pay | Admitting: Internal Medicine

## 2021-11-16 ENCOUNTER — Emergency Department (HOSPITAL_COMMUNITY): Payer: Medicare HMO

## 2021-11-16 DIAGNOSIS — R42 Dizziness and giddiness: Secondary | ICD-10-CM | POA: Diagnosis not present

## 2021-11-16 DIAGNOSIS — E538 Deficiency of other specified B group vitamins: Secondary | ICD-10-CM | POA: Insufficient documentation

## 2021-11-16 MED ORDER — APIXABAN 5 MG PO TABS
5.0000 mg | ORAL_TABLET | Freq: Once | ORAL | Status: AC
  Administered 2021-11-16: 06:00:00 5 mg via ORAL
  Filled 2021-11-16: qty 1

## 2021-11-16 MED ORDER — ACETAMINOPHEN 325 MG PO TABS
650.0000 mg | ORAL_TABLET | Freq: Once | ORAL | Status: AC
  Administered 2021-11-16: 02:00:00 650 mg via ORAL
  Filled 2021-11-16: qty 2

## 2021-11-16 MED ORDER — APIXABAN 5 MG PO TABS: 5.0000 mg | ORAL_TABLET | Freq: Two times a day (BID) | ORAL | 0 refills | Status: AC

## 2021-11-16 NOTE — Assessment & Plan Note (Signed)
With mild worsening, for change cymbalta to celexa 20 qd

## 2021-11-16 NOTE — ED Notes (Signed)
Patient transported to CT scan . 

## 2021-11-16 NOTE — Assessment & Plan Note (Signed)
New onset, unclear how long atrial flutter has been present, possibly accounts for her dizziness or other vague symptoms; volume stable, but will need to go to ED now

## 2021-11-16 NOTE — Assessment & Plan Note (Addendum)
?   Mild worsening even in the past mo, but possiblly related to atrial flutter, so for contd currnet med tx aricept and f/u neurology as planned feb 2023

## 2021-11-16 NOTE — ED Provider Notes (Signed)
[facility] EMERGENCY DEPARTMENT Provider Note   CSN: [id number] Arrival date & time: 11/15/21  1725     History Chief Complaint  Patient presents with   Abnormal ECG    Claudia [name] is a 75 y.o. female.  The history is provided by the patient and a relative.  Weakness Severity:  Moderate Onset quality:  Gradual Timing:  Intermittent Progression:  Waxing and waning Chronicity:  New Relieved by:  Nothing Worsened by:  Nothing Associated symptoms: dizziness and headaches   Associated symptoms: no chest pain, no fever, no shortness of breath and no vomiting   Patient with history of hypertension, mild cognitive impairment presents with multiple complaints.  Over the past month she has had intermittent episodes of fatigue and dizziness.  She also reports episodes of posterior headache.  No chest pain.  Patient was seen by her PCP who noted that she was in an abnormal heart rhythm.  This is a new diagnosis.  She is not on anticoagulation.    Past Medical History:  Diagnosis Date   Acute upper respiratory infections of unspecified site    Anxiety state, unspecified    Breast cancer ([place])    Chest pain, unspecified    Cramp of limb    Depressive disorder, not elsewhere classified    Diverticulitis    pt stated   DM (diabetes mellitus) ([place])    Headache(784.0)    Hiatal hernia    History of cold sores    IBS (irritable bowel syndrome)    Insomnia, unspecified    Internal hemorrhoids without mention of complication    Irritable bowel syndrome    Myalgia and myositis, unspecified    Neuralgia of chest    post op   Neuralgia, neuritis, and radiculitis, unspecified    Osteoarthritis    Osteoarthrosis, unspecified whether generalized or localized, unspecified site    Other B-complex deficiencies    Other malaise and fatigue    Personal history of malignant neoplasm of breast    Pneumonia    hx of   Restless legs syndrome (RLS)    Seizures ([place])     "mini seizures" 15 yrs. ago. Pt. states did have work up and was normal   Stricture and stenosis of esophagus    Unspecified essential hypertension     Patient Active Problem List   Diagnosis Date Noted   Irregular heart beats 11/15/2021   Mild cognitive impairment 11/15/2021   Osteopenia 12/22/2019   Wrist fracture, closed 12/22/2019   Scaphoid fracture, wrist, closed 11/02/2019   Toe contusion 11/02/2019   Facial trauma, initial encounter 10/29/2019   Confusion 10/29/2019   Left wrist pain 10/29/2019   Ingrowing nail, left great toe 09/21/2019   Memory loss 09/21/2019   Occipital neuralgia of right side 06/17/2019   Foot pain, bilateral 01/07/2019   Contusion of hip 10/07/2018   Right shoulder pain 10/07/2018   CRI (chronic renal insufficiency), stage 3 (moderate) ([place]) 07/01/2018   Dog scratch 05/12/2018   Sinusitis, chronic 12/02/2017   Paresthesia 11/06/2017   Skin rash 08/07/2017   Headache 05/13/2017   OAB (overactive bladder) 05/14/2016   Concussion with loss of consciousness 01/05/2016   Neoplasm of uncertain behavior of skin 01/05/2016   Acute upper respiratory infection 09/12/2015   Diabetes type 2, controlled ([place]) 09/12/2015   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 ([place]) 42/59/5638   Diastolic dysfunction without heart failure 05/14/2015   Preop exam for internal medicine 05/10/2015  Hamstring tendonitis of right thigh 01/27/2015   Forgetfulness 11/02/2014   Pes planus of both feet 08/08/2014   Avulsion fracture of lateral malleolus 06/07/2014   Sprain of ankle, unspecified site 02/01/2014   Tibialis posterior tendinitis 12/03/2013   Ankle pain, right 09/14/2013   Hyperglycemia 09/14/2013   Leg pain 04/29/2012   Knee pain 04/29/2012   Well adult exam 10/23/2011   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   Weight gain 04/10/2011   LIPOMA 10/17/2010   HEMATURIA UNSPECIFIED 08/27/2010   Abdominal pain 07/16/2010   WEIGHT LOSS 05/09/2010   FREQUENCY, URINARY  05/09/2010   ABDOMINAL PAIN, EPIGASTRIC 05/09/2010   VERTIGO 02/06/2010   NECK PAIN 03/28/2009   GALLSTONES 12/07/2008   HEMORRHOIDS, INTERNAL 11/23/2008   ESOPHAGEAL STRICTURE 11/23/2008   HIATAL HERNIA 11/23/2008   IBS 11/23/2008   Upper respiratory infection 11/01/2008   Chest wall pain 11/01/2008   Pernicious anemia 08/16/2008   Chronic fatigue 08/16/2008   Anxiety disorder 11/25/2007   Depression 11/25/2007   Osteoarthritis 11/25/2007   CRAMPS,LEG 11/25/2007   BREAST CANCER, HX OF 11/25/2007   RESTLESS LEG SYNDROME 07/23/2007   Essential hypertension 07/23/2007   Fibromyalgia 07/23/2007   NEURALGIA 07/23/2007   INSOMNIA 07/23/2007    Past Surgical History:  Procedure Laterality Date   ABDOMINAL HYSTERECTOMY     breast reconstruction surgery bilateral - 8 yrs. ago     CHOLECYSTECTOMY     deviated septum surgery x 3     hx. of bell's palsy at 14 yrs. old     MASTECTOMY     bilateral total   Mortons neuroma on right foot between middle toe     rotator cuff surgery -left shoulder- 8 yrs. ago     salva gland removed from left side of neck 60 yrs. ago     TONSILLECTOMY     TOTAL KNEE ARTHROPLASTY Left 06/01/2015   Procedure: LEFT TOTAL KNEE ARTHROPLASTY;  Surgeon: Rod Can, MD;  Location: [facility];  Service: Orthopedics;  Laterality: Left;     OB History   No obstetric history on file.     Family History  Problem Relation Age of Onset   Diabetes Mother    Kidney disease Mother    Lymphoma Mother    Diabetes Sister    Heart disease Sister    Stroke Sister    Diabetes Brother    Kidney disease Brother    Coronary artery disease Brother    Breast cancer Paternal Grandmother    Coronary artery disease Maternal Grandmother     Social History   Tobacco Use   Smoking status: Former    Types: Cigarettes    Quit date: 11/26/1971    Years since quitting: 50.0   Smokeless tobacco: Never  Vaping Use   Vaping Use: Never used  Substance Use Topics   Alcohol  use: No   Drug use: No    Home Medications Prior to Admission medications   Medication Sig Start Date End Date Taking? Authorizing Provider  acetaminophen (TYLENOL) 500 MG tablet Take 500 mg by mouth every 6 (six) hours as needed for moderate pain.   Yes [provider]  amLODipine (NORVASC) 2.5 MG tablet Take 1 tablet (2.5 mg total) by mouth daily. For your blood pressure 12/27/20  Yes Plotnikov, Evie Lacks, MD  cholecalciferol (VITAMIN D) 1000 units tablet Take 1 tablet by mouth daily.   Yes [provider]  Cyanocobalamin (VITAMIN B-12) 1000 MCG SUBL Place 1 tablet (1,000 mcg total) under  the tongue daily. 11/15/21  Yes Biagio Borg, MD  donepezil (ARICEPT) 10 MG tablet Take 10 mg by mouth once daily 11/15/21  Yes Biagio Borg, MD  DULoxetine (CYMBALTA) 60 MG capsule Take 60 mg by mouth daily.   Yes [provider]  gabapentin (NEURONTIN) 600 MG tablet TAKE 1 TABLET THREE TIMES DAILY Patient taking differently: Take 600 mg by mouth 3 (three) times daily. 02/23/21  Yes Plotnikov, Evie Lacks, MD  metFORMIN (GLUCOPHAGE) 500 MG tablet TAKE 1 TABLET EVERY DAY WITH BREAKFAST Patient taking differently: Take 500 mg by mouth daily with breakfast. TAKE 1 TABLET EVERY DAY WITH BREAKFAST 10/19/21  Yes Plotnikov, Evie Lacks, MD  olmesartan (BENICAR) 20 MG tablet Take 1 tablet (20 mg total) by mouth daily. Annual appt due in May must see provider for future refills 02/26/21  Yes Plotnikov, Evie Lacks, MD  omeprazole (PRILOSEC) 40 MG capsule Take 1 capsule (40 mg total) by mouth 2 (two) times daily. For your GERD 04/24/21  Yes Plotnikov, Evie Lacks, MD  solifenacin (VESICARE) 5 MG tablet TAKE 1 TABLET EVERY DAY Patient taking differently: Take 5 mg by mouth daily. TAKE 1 TABLET EVERY DAY 10/25/21  Yes Plotnikov, Evie Lacks, MD  citalopram (CELEXA) 20 MG tablet Take 1 tablet (20 mg total) by mouth daily. Patient not taking: Reported on 11/16/2021 11/15/21   Biagio Borg, MD  SYRINGE-NEEDLE,  DISP, 3 ML (BD ECLIPSE SYRINGE) 25G X 1" 3 ML MISC As directed SQ for B12 shots 06/17/19   Plotnikov, Evie Lacks, MD    Allergies    Aricept Reather Littler hcl], Bupropion hcl, Lovastatin, Norco [hydrocodone-acetaminophen], Olanzapine-fluoxetine hcl, Sodium lauryl sulfate, Sulfonamide derivatives, Tramadol, and Tape  Review of Systems   Review of Systems  Constitutional:  Positive for fatigue. Negative for fever.  Eyes:  Negative for visual disturbance.  Respiratory:  Negative for shortness of breath.   Cardiovascular:  Positive for palpitations. Negative for chest pain.  Gastrointestinal:  Negative for blood in stool and vomiting.  Genitourinary:  Negative for vaginal bleeding.  Neurological:  Positive for dizziness, weakness and headaches. Negative for syncope.  All other systems reviewed and are negative.  Physical Exam Updated Vital Signs BP 132/85 (BP Location: Right Arm)    Pulse 89    Temp 98.5 F (36.9 C) (Oral)    Resp (!) 23    Ht 1.702 m (5\' 7" )    Wt 79 kg    SpO2 100%    BMI 27.28 kg/m   Physical Exam CONSTITUTIONAL: Elderly, no acute distress HEAD: Normocephalic/atraumatic EYES: EOMI/PERRL ENMT: Mucous membranes moist NECK: supple no meningeal signs SPINE/BACK:entire spine nontender CV: Irregular, no loud murmurs LUNGS: Lungs are clear to auscultation bilaterally, no apparent distress ABDOMEN: soft, nontender, no rebound or guarding, bowel sounds noted throughout abdomen GU:no cva tenderness NEURO: Pt is awake/alert/appropriate, moves all extremitiesx4.  No facial droop.  No arm or leg drift EXTREMITIES: pulses normal/equal, full ROM SKIN: warm, color normal PSYCH: no abnormalities of mood noted, alert and oriented to situation  ED Results / Procedures / Treatments   Labs (all labs ordered are listed, but only abnormal results are displayed) Labs Reviewed  BASIC METABOLIC PANEL - Abnormal; Notable for the following components:      Result Value   Glucose, Bld 106  (*)    Creatinine, Ser 1.33 (*)    GFR, Estimated 41 (*)    All other components within normal limits  CBC - Abnormal; Notable for the following  components:   Hemoglobin 15.3 (*)    HCT 46.7 (*)    All other components within normal limits  TROPONIN I (HIGH SENSITIVITY)  TROPONIN I (HIGH SENSITIVITY)    EKG EKG Interpretation  Date/Time:  Thursday November 15 2021 18:02:30 EST Ventricular Rate:  93 PR Interval:    QRS Duration: 82 QT Interval:  346 QTC Calculation: 430 R Axis:   47 Text Interpretation: Atrial fibrillation with premature ventricular or aberrantly conducted complexes Nonspecific ST abnormality Abnormal ECG Confirmed by Ripley Fraise [phone]) on 11/16/2021 1:37:20 AM  Radiology DG Chest 2 View  Result Date: 11/15/2021 CLINICAL DATA:  Shortness of breath EXAM: CHEST - 2 VIEW COMPARISON:  10/30/2006 FINDINGS: The heart size and mediastinal contours are within normal limits. Both lungs are clear. The visualized skeletal structures are unremarkable. IMPRESSION: No active cardiopulmonary disease. Electronically Signed   By: Donavan Foil M.D.   On: 11/15/2021 18:55   CT Head Wo Contrast  Result Date: 11/16/2021 CLINICAL DATA:  75 year old female with history of worsening headache. Dizziness. Shortness of breath. EXAM: CT HEAD WITHOUT CONTRAST TECHNIQUE: Contiguous axial images were obtained from the base of the skull through the vertex without intravenous contrast. COMPARISON:  Head CT 10/29/2019. FINDINGS: Brain: Very mild cerebral atrophy. Patchy areas of mild decreased attenuation are noted throughout the deep and periventricular white matter of the cerebral hemispheres bilaterally, compatible with mild chronic microvascular ischemic disease. No evidence of acute infarction, hemorrhage, hydrocephalus, extra-axial collection or mass lesion/mass effect. Vascular: No hyperdense vessel or unexpected calcification. Skull: Normal. Negative for fracture or focal lesion.  Sinuses/Orbits: No acute finding. Other: None. IMPRESSION: 1. No acute intracranial abnormalities. 2. Very mild cerebral atrophy with mild chronic microvascular ischemic changes in the cerebral white matter, as above. Electronically Signed   By: Vinnie Langton M.D.   On: 11/16/2021 05:37    Procedures Procedures   Medications Ordered in ED Medications  acetaminophen (TYLENOL) tablet 650 mg (650 mg Oral Given 11/16/21 0200)    ED Course  I have reviewed the triage vital signs and the nursing notes.  Pertinent labs & imaging results that were available during my care of the patient were reviewed by me and considered in my medical decision making (see chart for details).    MDM Rules/Calculators/A&P     CHA2DS2-VASc Score: 4                    This patient presents to the ED for concern of dizziness and fatigue, this involves an extensive number of treatment options, and is a complaint that carries with it a high risk of complications and morbidity.  The differential diagnosis includes stroke, metabolic derangement, cardiac dysrhythmia, dehydration   Additional history obtained:  Additional history obtained from daughter EKG from PCP office reveals atrial flutter  Lab Tests:  I Ordered, reviewed, and interpreted labs.  The pertinent results include: Mild renal insufficiency   Imaging Studies ordered:  I ordered imaging studies including chest x-ray I independently visualized and interpreted imaging which showed no acute findings I agree with the radiologist interpretation   Cardiac Monitoring:  The patient was maintained on a cardiac monitor.  I personally viewed and interpreted the cardiac monitored which showed an underlying rhythm of: Atrial flutter   Medicines ordered and prescription drug management:  I ordered medication including Eliquis for atrial flutter Reevaluation of the patient after these medicines showed that the patient stayed the same I have reviewed  the patients home medicines  and have made adjustments as needed   Critical Interventions:  Started anticoagulation   Consultations Obtained:  I requested consultation with the pharmacist,  and discussed lab and imaging findings as well as pertinent plan - they recommend: We will start anticoagulation    Reevaluation:  After the interventions noted above, I reevaluated the patient and found that they have :stayed the same   Dispostion:  After consideration of the diagnostic results and the patients response to treatment feel that the patent would benefit from discharge home.     Patient with new onset atrial flutter unclear of when this started.  Rate is controlled.  We will start anticoagulation due to elevated [place] Vasc score.  Refer to cardiology.  CT head was performed to rule out intracranial hemorrhage given her headache.  This was negative.  Patient denies any recent bleeding.  Will discharge home with Eliquis Final Clinical Impression(s) / ED Diagnoses Final diagnoses:  Atrial flutter, unspecified type [facility])    Rx / DC Orders ED Discharge Orders          Ordered    Amb referral to [facility]        11/16/21 0351    apixaban (ELIQUIS) 5 MG TABS tablet  2 times daily        11/16/21 0533             Ripley Fraise, MD 11/16/21 [phone]

## 2021-11-16 NOTE — Assessment & Plan Note (Signed)
Lab Results  Component Value Date   VITAMINB12 1,044 (H) 03/28/2021   Stable, cont oral replacement - b12 1000 mcg qd

## 2021-11-21 ENCOUNTER — Encounter: Payer: Medicare HMO | Admitting: Psychology

## 2021-11-30 ENCOUNTER — Ambulatory Visit (HOSPITAL_COMMUNITY): Payer: Medicare HMO | Admitting: Physician Assistant

## 2021-12-10 DIAGNOSIS — I4811 Longstanding persistent atrial fibrillation: Secondary | ICD-10-CM | POA: Diagnosis not present

## 2021-12-10 DIAGNOSIS — I4892 Unspecified atrial flutter: Secondary | ICD-10-CM | POA: Diagnosis not present

## 2021-12-10 NOTE — Addendum Note (Signed)
Addended by: Terence Lux A on: 12/10/2021 02:08 PM   Modules accepted: Orders

## 2021-12-19 ENCOUNTER — Telehealth: Payer: Self-pay | Admitting: *Deleted

## 2021-12-19 NOTE — Telephone Encounter (Signed)
Rec'd fax from [facility] requesting refills on Duloxetine 60 mg. Med has not been filled by pcp. Pls advise.Marland KitchenJohny Chess

## 2021-12-20 MED ORDER — DULOXETINE HCL 60 MG PO CPEP: 60.0000 mg | ORAL_CAPSULE | Freq: Every day | ORAL | 3 refills | Status: DC

## 2021-12-20 NOTE — Telephone Encounter (Signed)
Okay.  Thanks.

## 2021-12-20 NOTE — Telephone Encounter (Signed)
MD sent../lmb 

## 2022-01-01 DIAGNOSIS — I4892 Unspecified atrial flutter: Secondary | ICD-10-CM | POA: Insufficient documentation

## 2022-01-01 DIAGNOSIS — R413 Other amnesia: Secondary | ICD-10-CM | POA: Diagnosis not present

## 2022-01-01 DIAGNOSIS — I4811 Longstanding persistent atrial fibrillation: Secondary | ICD-10-CM | POA: Insufficient documentation

## 2022-01-15 DIAGNOSIS — I4892 Unspecified atrial flutter: Secondary | ICD-10-CM | POA: Diagnosis not present

## 2022-01-15 DIAGNOSIS — Z885 Allergy status to narcotic agent status: Secondary | ICD-10-CM | POA: Diagnosis not present

## 2022-01-15 DIAGNOSIS — Z01818 Encounter for other preprocedural examination: Secondary | ICD-10-CM | POA: Diagnosis not present

## 2022-01-15 DIAGNOSIS — Z882 Allergy status to sulfonamides status: Secondary | ICD-10-CM | POA: Diagnosis not present

## 2022-01-15 DIAGNOSIS — Z888 Allergy status to other drugs, medicaments and biological substances status: Secondary | ICD-10-CM | POA: Diagnosis not present

## 2022-01-15 DIAGNOSIS — J9811 Atelectasis: Secondary | ICD-10-CM | POA: Diagnosis not present

## 2022-01-15 DIAGNOSIS — Z79899 Other long term (current) drug therapy: Secondary | ICD-10-CM | POA: Diagnosis not present

## 2022-01-15 DIAGNOSIS — Z01812 Encounter for preprocedural laboratory examination: Secondary | ICD-10-CM | POA: Diagnosis not present

## 2022-01-15 DIAGNOSIS — K219 Gastro-esophageal reflux disease without esophagitis: Secondary | ICD-10-CM | POA: Diagnosis not present

## 2022-01-15 DIAGNOSIS — Z7901 Long term (current) use of anticoagulants: Secondary | ICD-10-CM | POA: Diagnosis not present

## 2022-01-15 DIAGNOSIS — I4811 Longstanding persistent atrial fibrillation: Secondary | ICD-10-CM | POA: Diagnosis not present

## 2022-01-16 DIAGNOSIS — Z79899 Other long term (current) drug therapy: Secondary | ICD-10-CM | POA: Diagnosis not present

## 2022-01-16 DIAGNOSIS — Z01818 Encounter for other preprocedural examination: Secondary | ICD-10-CM | POA: Diagnosis not present

## 2022-01-16 DIAGNOSIS — Z885 Allergy status to narcotic agent status: Secondary | ICD-10-CM | POA: Diagnosis not present

## 2022-01-16 DIAGNOSIS — I10 Essential (primary) hypertension: Secondary | ICD-10-CM | POA: Diagnosis not present

## 2022-01-16 DIAGNOSIS — I4811 Longstanding persistent atrial fibrillation: Secondary | ICD-10-CM | POA: Diagnosis not present

## 2022-01-16 DIAGNOSIS — K219 Gastro-esophageal reflux disease without esophagitis: Secondary | ICD-10-CM | POA: Diagnosis not present

## 2022-01-16 DIAGNOSIS — Z7901 Long term (current) use of anticoagulants: Secondary | ICD-10-CM | POA: Diagnosis not present

## 2022-01-16 DIAGNOSIS — Z882 Allergy status to sulfonamides status: Secondary | ICD-10-CM | POA: Diagnosis not present

## 2022-01-16 DIAGNOSIS — Z888 Allergy status to other drugs, medicaments and biological substances status: Secondary | ICD-10-CM | POA: Diagnosis not present

## 2022-01-16 DIAGNOSIS — I4892 Unspecified atrial flutter: Secondary | ICD-10-CM | POA: Diagnosis not present

## 2022-01-17 DIAGNOSIS — Z79899 Other long term (current) drug therapy: Secondary | ICD-10-CM | POA: Diagnosis not present

## 2022-01-17 DIAGNOSIS — I4892 Unspecified atrial flutter: Secondary | ICD-10-CM | POA: Diagnosis not present

## 2022-01-17 DIAGNOSIS — K219 Gastro-esophageal reflux disease without esophagitis: Secondary | ICD-10-CM | POA: Diagnosis not present

## 2022-01-17 DIAGNOSIS — Z882 Allergy status to sulfonamides status: Secondary | ICD-10-CM | POA: Diagnosis not present

## 2022-01-17 DIAGNOSIS — Z01818 Encounter for other preprocedural examination: Secondary | ICD-10-CM | POA: Diagnosis not present

## 2022-01-17 DIAGNOSIS — Z885 Allergy status to narcotic agent status: Secondary | ICD-10-CM | POA: Diagnosis not present

## 2022-01-17 DIAGNOSIS — I4811 Longstanding persistent atrial fibrillation: Secondary | ICD-10-CM | POA: Diagnosis not present

## 2022-01-17 DIAGNOSIS — Z7901 Long term (current) use of anticoagulants: Secondary | ICD-10-CM | POA: Diagnosis not present

## 2022-01-17 DIAGNOSIS — Z888 Allergy status to other drugs, medicaments and biological substances status: Secondary | ICD-10-CM | POA: Diagnosis not present

## 2022-01-18 ENCOUNTER — Encounter: Payer: Medicare HMO | Admitting: Psychology

## 2022-01-21 DIAGNOSIS — Z8679 Personal history of other diseases of the circulatory system: Secondary | ICD-10-CM | POA: Diagnosis not present

## 2022-01-21 DIAGNOSIS — I4892 Unspecified atrial flutter: Secondary | ICD-10-CM | POA: Diagnosis not present

## 2022-01-21 DIAGNOSIS — R0609 Other forms of dyspnea: Secondary | ICD-10-CM | POA: Diagnosis not present

## 2022-01-21 DIAGNOSIS — Z9889 Other specified postprocedural states: Secondary | ICD-10-CM | POA: Diagnosis not present

## 2022-01-25 ENCOUNTER — Encounter: Payer: Medicare HMO | Admitting: Psychology

## 2022-01-28 ENCOUNTER — Other Ambulatory Visit: Payer: Self-pay | Admitting: Internal Medicine

## 2022-01-28 DIAGNOSIS — Z8679 Personal history of other diseases of the circulatory system: Secondary | ICD-10-CM | POA: Diagnosis not present

## 2022-01-28 DIAGNOSIS — Z9889 Other specified postprocedural states: Secondary | ICD-10-CM | POA: Diagnosis not present

## 2022-01-28 DIAGNOSIS — I4892 Unspecified atrial flutter: Secondary | ICD-10-CM | POA: Diagnosis not present

## 2022-01-28 DIAGNOSIS — R0609 Other forms of dyspnea: Secondary | ICD-10-CM | POA: Diagnosis not present

## 2022-02-11 ENCOUNTER — Ambulatory Visit: Payer: Medicare HMO | Admitting: Physician Assistant

## 2022-03-04 DIAGNOSIS — I4892 Unspecified atrial flutter: Secondary | ICD-10-CM | POA: Diagnosis not present

## 2022-03-04 DIAGNOSIS — I4811 Longstanding persistent atrial fibrillation: Secondary | ICD-10-CM | POA: Diagnosis not present

## 2022-03-04 DIAGNOSIS — Z9889 Other specified postprocedural states: Secondary | ICD-10-CM | POA: Diagnosis not present

## 2022-03-04 DIAGNOSIS — R0609 Other forms of dyspnea: Secondary | ICD-10-CM | POA: Diagnosis not present

## 2022-03-04 DIAGNOSIS — Z8679 Personal history of other diseases of the circulatory system: Secondary | ICD-10-CM | POA: Diagnosis not present

## 2022-03-27 DIAGNOSIS — R413 Other amnesia: Secondary | ICD-10-CM | POA: Diagnosis not present

## 2022-04-16 ENCOUNTER — Telehealth: Payer: Self-pay | Admitting: Internal Medicine

## 2022-04-16 NOTE — Telephone Encounter (Signed)
Daughter called in and states pt has been experiencing vomiting, tremors, and a major headache.   States pt has taken both Cymbalta and Celexa.   She pulled Celexa out of her medication box and wants to know if she should pull Cymbalta as well.   Pt has made an appt for tomorrow to discuss another issue.   Please call pt ASAP with recommendations on what to do.

## 2022-04-17 ENCOUNTER — Encounter: Payer: Self-pay | Admitting: Internal Medicine

## 2022-04-17 ENCOUNTER — Other Ambulatory Visit (INDEPENDENT_AMBULATORY_CARE_PROVIDER_SITE_OTHER): Payer: Medicare HMO

## 2022-04-17 ENCOUNTER — Ambulatory Visit (INDEPENDENT_AMBULATORY_CARE_PROVIDER_SITE_OTHER): Payer: Medicare HMO | Admitting: Internal Medicine

## 2022-04-17 VITALS — BP 138/82 | HR 65 | Temp 98.2°F | Ht 67.0 in | Wt 165.0 lb

## 2022-04-17 DIAGNOSIS — G44229 Chronic tension-type headache, not intractable: Secondary | ICD-10-CM

## 2022-04-17 DIAGNOSIS — I10 Essential (primary) hypertension: Secondary | ICD-10-CM | POA: Diagnosis not present

## 2022-04-17 DIAGNOSIS — I4892 Unspecified atrial flutter: Secondary | ICD-10-CM | POA: Diagnosis not present

## 2022-04-17 DIAGNOSIS — N3281 Overactive bladder: Secondary | ICD-10-CM | POA: Diagnosis not present

## 2022-04-17 LAB — SEDIMENTATION RATE: Sed Rate: 21 mm/hr (ref 0–30)

## 2022-04-17 MED ORDER — SOLIFENACIN SUCCINATE 5 MG PO TABS: 5.0000 mg | ORAL_TABLET | Freq: Every day | ORAL | 3 refills | Status: AC

## 2022-04-17 MED ORDER — DULOXETINE HCL 60 MG PO CPEP: 60.0000 mg | ORAL_CAPSULE | Freq: Every day | ORAL | 3 refills | Status: AC

## 2022-04-17 MED ORDER — DONEPEZIL HCL 10 MG PO TABS: ORAL_TABLET | ORAL | 3 refills | Status: AC

## 2022-04-17 NOTE — Telephone Encounter (Signed)
Noted.  Do not take either today.  Keep the appointment for today.  According to our records she was not taking Celexa since December 2022.  Thanks

## 2022-04-17 NOTE — Assessment & Plan Note (Signed)
On Vesicare 

## 2022-04-17 NOTE — Progress Notes (Signed)
Subjective:  Patient ID: Claudia [name], female    DOB: 1946-07-15  Age: 76 y.o. MRN: [id number]  CC: Thyroid concerns and Medication Problem   HPI Claudia [name] presents for tremors on Cymbata and Celexa, A fib, memory problems, HTN Taking Gabapentin one a day - not helping She is here w/her dtr Claudia [name] who helps w/hx  Outpatient Medications Prior to Visit  Medication Sig Dispense Refill   acetaminophen (TYLENOL) 500 MG tablet Take 500 mg by mouth every 6 (six) hours as needed for moderate pain.     apixaban (ELIQUIS) 5 MG TABS tablet Take by mouth.     cholecalciferol (VITAMIN D) 1000 units tablet Take 1 tablet by mouth daily.     olmesartan (BENICAR) 20 MG tablet Take 1 tablet (20 mg total) by mouth daily. Annual appt due in May must see provider for future refills 90 tablet 0   omeprazole (PRILOSEC) 40 MG capsule Take 1 capsule (40 mg total) by mouth 2 (two) times daily. For your GERD 180 capsule 3   SYRINGE-NEEDLE, DISP, 3 ML (BD ECLIPSE SYRINGE) 25G X 1" 3 ML MISC As directed SQ for B12 shots 50 each 3   donepezil (ARICEPT) 10 MG tablet Take 10 mg by mouth once daily 90 tablet 3   DULoxetine (CYMBALTA) 60 MG capsule Take 1 capsule (60 mg total) by mouth daily. 90 capsule 3   gabapentin (NEURONTIN) 600 MG tablet TAKE 1 TABLET THREE TIMES DAILY (Patient taking differently: Take 600 mg by mouth 3 (three) times daily.) 270 tablet 3   solifenacin (VESICARE) 5 MG tablet TAKE 1 TABLET EVERY DAY (Patient taking differently: Take 5 mg by mouth daily. TAKE 1 TABLET EVERY DAY) 90 tablet 0   acetaminophen (TYLENOL) 500 MG tablet Take by mouth.     apixaban (ELIQUIS) 5 MG TABS tablet Take 1 tablet (5 mg total) by mouth 2 (two) times daily. 60 tablet 0   Cyanocobalamin (VITAMIN B-12) 1000 MCG SUBL Place 1 tablet (1,000 mcg total) under the tongue daily. 100 tablet 3   amLODipine (NORVASC) 2.5 MG tablet Take 1 tablet (2.5 mg total) by mouth daily. Please call our office to schedule a follow up  to receive additional refills. 30 tablet 0   citalopram (CELEXA) 20 MG tablet Take 1 tablet (20 mg total) by mouth daily. (Patient not taking: Reported on 11/16/2021) 90 tablet 3   donepezil (ARICEPT) 10 MG tablet Take by mouth.     metFORMIN (GLUCOPHAGE) 500 MG tablet TAKE 1 TABLET EVERY DAY WITH BREAKFAST (Patient taking differently: Take 500 mg by mouth daily with breakfast. TAKE 1 TABLET EVERY DAY WITH BREAKFAST) 90 tablet 3   No facility-administered medications prior to visit.    ROS: Review of Systems  Constitutional:  Positive for fatigue. Negative for activity change, appetite change, chills and unexpected weight change.  HENT:  Negative for congestion, mouth sores and sinus pressure.   Eyes:  Negative for visual disturbance.  Respiratory:  Negative for cough and chest tightness.   Gastrointestinal:  Negative for abdominal pain and nausea.  Genitourinary:  Negative for difficulty urinating, frequency and vaginal pain.  Musculoskeletal:  Negative for back pain and gait problem.  Skin:  Negative for pallor and rash.  Neurological:  Positive for dizziness, tremors and headaches. Negative for weakness, light-headedness and numbness.  Psychiatric/Behavioral:  Positive for decreased concentration, dysphoric mood and sleep disturbance. Negative for confusion and suicidal ideas. The patient is nervous/anxious.    Objective:  BP  138/82 (BP Location: Left Arm, Patient Position: Sitting, Cuff Size: Large)   Pulse 65   Temp 98.2 F (36.8 C) (Oral)   Ht '5\' 7"'$  (1.702 m)   Wt 165 lb (74.8 kg)   SpO2 96%   BMI 25.84 kg/m   BP Readings from Last 3 Encounters:  04/17/22 138/82  11/16/21 (!) 146/87  11/15/21 126/78    Wt Readings from Last 3 Encounters:  04/17/22 165 lb (74.8 kg)  11/15/21 174 lb 2.6 oz (79 kg)  11/15/21 175 lb (79.4 kg)    Physical Exam Constitutional:      General: She is not in acute distress.    Appearance: She is well-developed. She is obese.  HENT:      Head: Normocephalic.     Right Ear: External ear normal.     Left Ear: External ear normal.     Nose: Nose normal.  Eyes:     General:        Right eye: No discharge.        Left eye: No discharge.     Conjunctiva/sclera: Conjunctivae normal.     Pupils: Pupils are equal, round, and reactive to light.  Neck:     Thyroid: No thyromegaly.     Vascular: No JVD.     Trachea: No tracheal deviation.  Cardiovascular:     Rate and Rhythm: Normal rate and regular rhythm.     Heart sounds: Normal heart sounds.  Pulmonary:     Effort: No respiratory distress.     Breath sounds: No stridor. No wheezing.  Abdominal:     General: Bowel sounds are normal. There is no distension.     Palpations: Abdomen is soft. There is no mass.     Tenderness: There is no abdominal tenderness. There is no guarding or rebound.  Musculoskeletal:        General: Tenderness present.     Cervical back: Normal range of motion and neck supple. No rigidity.  Lymphadenopathy:     Cervical: No cervical adenopathy.  Skin:    Findings: No erythema or rash.  Neurological:     Mental Status: She is oriented to person, place, and time.     Cranial Nerves: No cranial nerve deficit.     Motor: No abnormal muscle tone.     Coordination: Coordination abnormal.     Gait: Gait abnormal.     Deep Tendon Reflexes: Reflexes normal.  Psychiatric:        Behavior: Behavior normal.        Thought Content: Thought content normal.        Judgment: Judgment normal.   Antalgic gait  Lab Results  Component Value Date   WBC 7.5 11/15/2021   HGB 15.3 (H) 11/15/2021   HCT 46.7 (H) 11/15/2021   PLT 271 11/15/2021   GLUCOSE 106 (H) 11/15/2021   CHOL 200 09/26/2020   TRIG 120.0 09/26/2020   HDL 41.20 09/26/2020   LDLCALC 135 (H) 09/26/2020   ALT 9 03/28/2021   AST 15 03/28/2021   NA 140 11/15/2021   K 3.9 11/15/2021   CL 109 11/15/2021   CREATININE 1.33 (H) 11/15/2021   BUN 19 11/15/2021   CO2 23 11/15/2021   TSH 4.18  09/26/2020   INR 1.02 05/26/2015   HGBA1C 6.3 09/26/2020    DG Chest 2 View  Result Date: 11/15/2021 CLINICAL DATA:  Shortness of breath EXAM: CHEST - 2 VIEW COMPARISON:  10/30/2006 FINDINGS: The heart size and  mediastinal contours are within normal limits. Both lungs are clear. The visualized skeletal structures are unremarkable. IMPRESSION: No active cardiopulmonary disease. Electronically Signed   By: Donavan Foil M.D.   On: 11/15/2021 18:55   CT Head Wo Contrast  Result Date: 11/16/2021 CLINICAL DATA:  76 year old female with history of worsening headache. Dizziness. Shortness of breath. EXAM: CT HEAD WITHOUT CONTRAST TECHNIQUE: Contiguous axial images were obtained from the base of the skull through the vertex without intravenous contrast. COMPARISON:  Head CT 10/29/2019. FINDINGS: Brain: Very mild cerebral atrophy. Patchy areas of mild decreased attenuation are noted throughout the deep and periventricular white matter of the cerebral hemispheres bilaterally, compatible with mild chronic microvascular ischemic disease. No evidence of acute infarction, hemorrhage, hydrocephalus, extra-axial collection or mass lesion/mass effect. Vascular: No hyperdense vessel or unexpected calcification. Skull: Normal. Negative for fracture or focal lesion. Sinuses/Orbits: No acute finding. Other: None. IMPRESSION: 1. No acute intracranial abnormalities. 2. Very mild cerebral atrophy with mild chronic microvascular ischemic changes in the cerebral white matter, as above. Electronically Signed   By: Vinnie Langton M.D.   On: 11/16/2021 05:37    Assessment & Plan:   Problem List Items Addressed This Visit     Atrial flutter ([place]) - Primary   Relevant Medications   apixaban (ELIQUIS) 5 MG TABS tablet   Essential hypertension    BP Readings from Last 3 Encounters:  04/17/22 138/82  11/16/21 (!) 146/87  11/15/21 126/78        Relevant Medications   apixaban (ELIQUIS) 5 MG TABS tablet   Other  Relevant Orders   TSH   T4, free   Headache   Relevant Medications   acetaminophen (TYLENOL) 500 MG tablet   DULoxetine (CYMBALTA) 60 MG capsule   Other Relevant Orders   Sedimentation rate   OAB (overactive bladder)    On Vesicare          Meds ordered this encounter  Medications   DULoxetine (CYMBALTA) 60 MG capsule    Sig: Take 1 capsule (60 mg total) by mouth daily.    Dispense:  90 capsule    Refill:  3   donepezil (ARICEPT) 10 MG tablet    Sig: Take 10 mg by mouth once daily    Dispense:  90 tablet    Refill:  3   solifenacin (VESICARE) 5 MG tablet    Sig: Take 1 tablet (5 mg total) by mouth daily. TAKE 1 TABLET EVERY DAY    Dispense:  90 tablet    Refill:  3      Follow-up: Return in about 2 weeks (around 05/01/2022) for a follow-up visit.  Walker Kehr, MD

## 2022-04-17 NOTE — Assessment & Plan Note (Signed)
BP Readings from Last 3 Encounters:  04/17/22 138/82  11/16/21 (!) 146/87  11/15/21 126/78

## 2022-04-18 LAB — T4, FREE: Free T4: 1.32 ng/dL (ref 0.60–1.60)

## 2022-04-18 LAB — TSH: TSH: 3.73 u[IU]/mL (ref 0.35–5.50)

## 2022-04-19 DIAGNOSIS — Z882 Allergy status to sulfonamides status: Secondary | ICD-10-CM | POA: Diagnosis not present

## 2022-04-19 DIAGNOSIS — I10 Essential (primary) hypertension: Secondary | ICD-10-CM | POA: Diagnosis not present

## 2022-04-19 DIAGNOSIS — R4182 Altered mental status, unspecified: Secondary | ICD-10-CM | POA: Diagnosis not present

## 2022-04-19 DIAGNOSIS — I4819 Other persistent atrial fibrillation: Secondary | ICD-10-CM | POA: Diagnosis not present

## 2022-04-19 DIAGNOSIS — N390 Urinary tract infection, site not specified: Secondary | ICD-10-CM | POA: Diagnosis not present

## 2022-04-19 DIAGNOSIS — Z885 Allergy status to narcotic agent status: Secondary | ICD-10-CM | POA: Diagnosis not present

## 2022-04-19 DIAGNOSIS — Z9049 Acquired absence of other specified parts of digestive tract: Secondary | ICD-10-CM | POA: Diagnosis not present

## 2022-04-19 DIAGNOSIS — R5383 Other fatigue: Secondary | ICD-10-CM | POA: Diagnosis not present

## 2022-04-19 DIAGNOSIS — K5790 Diverticulosis of intestine, part unspecified, without perforation or abscess without bleeding: Secondary | ICD-10-CM | POA: Diagnosis not present

## 2022-04-19 DIAGNOSIS — R10817 Generalized abdominal tenderness: Secondary | ICD-10-CM | POA: Diagnosis not present

## 2022-04-19 DIAGNOSIS — K5730 Diverticulosis of large intestine without perforation or abscess without bleeding: Secondary | ICD-10-CM | POA: Diagnosis not present

## 2022-04-19 DIAGNOSIS — R9082 White matter disease, unspecified: Secondary | ICD-10-CM | POA: Diagnosis not present

## 2022-04-19 DIAGNOSIS — N200 Calculus of kidney: Secondary | ICD-10-CM | POA: Diagnosis not present

## 2022-04-19 DIAGNOSIS — E860 Dehydration: Secondary | ICD-10-CM | POA: Diagnosis not present

## 2022-04-19 DIAGNOSIS — N3289 Other specified disorders of bladder: Secondary | ICD-10-CM | POA: Diagnosis not present

## 2022-05-06 ENCOUNTER — Encounter: Payer: Self-pay | Admitting: Gastroenterology

## 2022-05-06 ENCOUNTER — Ambulatory Visit: Payer: Medicare HMO | Admitting: Internal Medicine

## 2022-05-06 DIAGNOSIS — N390 Urinary tract infection, site not specified: Secondary | ICD-10-CM | POA: Diagnosis not present

## 2022-05-06 DIAGNOSIS — E876 Hypokalemia: Secondary | ICD-10-CM | POA: Diagnosis not present

## 2022-05-06 DIAGNOSIS — R41841 Cognitive communication deficit: Secondary | ICD-10-CM | POA: Diagnosis not present

## 2022-05-06 DIAGNOSIS — E079 Disorder of thyroid, unspecified: Secondary | ICD-10-CM | POA: Diagnosis not present

## 2022-05-06 DIAGNOSIS — G609 Hereditary and idiopathic neuropathy, unspecified: Secondary | ICD-10-CM | POA: Diagnosis not present

## 2022-05-06 DIAGNOSIS — D529 Folate deficiency anemia, unspecified: Secondary | ICD-10-CM | POA: Diagnosis not present

## 2022-05-06 DIAGNOSIS — R4182 Altered mental status, unspecified: Secondary | ICD-10-CM | POA: Diagnosis not present

## 2022-05-06 DIAGNOSIS — M859 Disorder of bone density and structure, unspecified: Secondary | ICD-10-CM | POA: Diagnosis not present

## 2022-05-06 DIAGNOSIS — Z13820 Encounter for screening for osteoporosis: Secondary | ICD-10-CM | POA: Diagnosis not present

## 2022-05-06 DIAGNOSIS — R946 Abnormal results of thyroid function studies: Secondary | ICD-10-CM | POA: Diagnosis not present

## 2022-05-20 DIAGNOSIS — R310 Gross hematuria: Secondary | ICD-10-CM | POA: Insufficient documentation

## 2022-05-20 DIAGNOSIS — I10 Essential (primary) hypertension: Secondary | ICD-10-CM | POA: Diagnosis not present

## 2022-05-20 DIAGNOSIS — N200 Calculus of kidney: Secondary | ICD-10-CM | POA: Insufficient documentation

## 2022-05-24 DIAGNOSIS — R4182 Altered mental status, unspecified: Secondary | ICD-10-CM | POA: Diagnosis not present

## 2022-06-04 DIAGNOSIS — Z8679 Personal history of other diseases of the circulatory system: Secondary | ICD-10-CM | POA: Diagnosis not present

## 2022-06-04 DIAGNOSIS — I4811 Longstanding persistent atrial fibrillation: Secondary | ICD-10-CM | POA: Diagnosis not present

## 2022-06-04 DIAGNOSIS — Z9889 Other specified postprocedural states: Secondary | ICD-10-CM | POA: Diagnosis not present

## 2022-06-04 DIAGNOSIS — R319 Hematuria, unspecified: Secondary | ICD-10-CM | POA: Diagnosis not present

## 2022-06-04 DIAGNOSIS — R0609 Other forms of dyspnea: Secondary | ICD-10-CM | POA: Diagnosis not present

## 2022-06-04 DIAGNOSIS — I4892 Unspecified atrial flutter: Secondary | ICD-10-CM | POA: Diagnosis not present

## 2022-06-04 DIAGNOSIS — I10 Essential (primary) hypertension: Secondary | ICD-10-CM | POA: Diagnosis not present

## 2022-07-02 DIAGNOSIS — Z13820 Encounter for screening for osteoporosis: Secondary | ICD-10-CM | POA: Diagnosis not present

## 2022-07-02 DIAGNOSIS — M8580 Other specified disorders of bone density and structure, unspecified site: Secondary | ICD-10-CM | POA: Diagnosis not present

## 2022-07-02 DIAGNOSIS — Z780 Asymptomatic menopausal state: Secondary | ICD-10-CM | POA: Diagnosis not present

## 2022-07-10 ENCOUNTER — Other Ambulatory Visit: Payer: Self-pay | Admitting: Internal Medicine

## 2022-07-10 ENCOUNTER — Other Ambulatory Visit: Payer: Self-pay | Admitting: *Deleted

## 2022-07-10 MED ORDER — OLMESARTAN MEDOXOMIL 20 MG PO TABS: 20.0000 mg | ORAL_TABLET | Freq: Every day | ORAL | 1 refills | Status: DC

## 2022-07-10 MED ORDER — OMEPRAZOLE 40 MG PO CPDR: 40.0000 mg | DELAYED_RELEASE_CAPSULE | Freq: Two times a day (BID) | ORAL | 1 refills | Status: AC

## 2022-07-12 DIAGNOSIS — G609 Hereditary and idiopathic neuropathy, unspecified: Secondary | ICD-10-CM | POA: Diagnosis not present

## 2022-07-12 DIAGNOSIS — R3129 Other microscopic hematuria: Secondary | ICD-10-CM | POA: Diagnosis not present

## 2022-07-12 DIAGNOSIS — R41841 Cognitive communication deficit: Secondary | ICD-10-CM | POA: Diagnosis not present

## 2022-07-12 DIAGNOSIS — M859 Disorder of bone density and structure, unspecified: Secondary | ICD-10-CM | POA: Diagnosis not present

## 2022-07-16 DIAGNOSIS — F0390 Unspecified dementia without behavioral disturbance: Secondary | ICD-10-CM | POA: Diagnosis not present

## 2022-07-16 DIAGNOSIS — R419 Unspecified symptoms and signs involving cognitive functions and awareness: Secondary | ICD-10-CM | POA: Diagnosis not present

## 2022-07-16 DIAGNOSIS — R41841 Cognitive communication deficit: Secondary | ICD-10-CM | POA: Diagnosis not present

## 2022-07-23 DIAGNOSIS — R310 Gross hematuria: Secondary | ICD-10-CM | POA: Diagnosis not present

## 2022-09-04 DIAGNOSIS — I10 Essential (primary) hypertension: Secondary | ICD-10-CM | POA: Diagnosis not present

## 2022-09-04 DIAGNOSIS — Z9889 Other specified postprocedural states: Secondary | ICD-10-CM | POA: Diagnosis not present

## 2022-09-04 DIAGNOSIS — R0609 Other forms of dyspnea: Secondary | ICD-10-CM | POA: Diagnosis not present

## 2022-09-04 DIAGNOSIS — Z8679 Personal history of other diseases of the circulatory system: Secondary | ICD-10-CM | POA: Diagnosis not present

## 2022-09-04 DIAGNOSIS — I4892 Unspecified atrial flutter: Secondary | ICD-10-CM | POA: Diagnosis not present

## 2022-09-09 ENCOUNTER — Telehealth: Payer: Self-pay | Admitting: Internal Medicine

## 2022-09-09 NOTE — Telephone Encounter (Signed)
LVM for pt to rtn my call to schedule AWV with [facility] call back # [phone] 

## 2022-10-22 DIAGNOSIS — R3915 Urgency of urination: Secondary | ICD-10-CM | POA: Insufficient documentation

## 2022-10-22 DIAGNOSIS — N200 Calculus of kidney: Secondary | ICD-10-CM | POA: Diagnosis not present

## 2022-10-22 DIAGNOSIS — K5900 Constipation, unspecified: Secondary | ICD-10-CM | POA: Diagnosis not present

## 2022-10-22 DIAGNOSIS — R310 Gross hematuria: Secondary | ICD-10-CM | POA: Diagnosis not present

## 2022-10-22 DIAGNOSIS — I10 Essential (primary) hypertension: Secondary | ICD-10-CM | POA: Diagnosis not present

## 2022-11-04 DIAGNOSIS — E119 Type 2 diabetes mellitus without complications: Secondary | ICD-10-CM | POA: Diagnosis not present

## 2022-11-04 DIAGNOSIS — I10 Essential (primary) hypertension: Secondary | ICD-10-CM | POA: Diagnosis not present

## 2022-11-04 DIAGNOSIS — I480 Paroxysmal atrial fibrillation: Secondary | ICD-10-CM | POA: Diagnosis not present

## 2022-11-04 DIAGNOSIS — R0609 Other forms of dyspnea: Secondary | ICD-10-CM | POA: Diagnosis not present

## 2022-11-04 DIAGNOSIS — I4892 Unspecified atrial flutter: Secondary | ICD-10-CM | POA: Diagnosis not present

## 2022-11-04 DIAGNOSIS — Z9889 Other specified postprocedural states: Secondary | ICD-10-CM | POA: Diagnosis not present

## 2022-11-04 DIAGNOSIS — I4811 Longstanding persistent atrial fibrillation: Secondary | ICD-10-CM | POA: Diagnosis not present

## 2022-11-04 DIAGNOSIS — Z8679 Personal history of other diseases of the circulatory system: Secondary | ICD-10-CM | POA: Diagnosis not present

## 2022-11-04 DIAGNOSIS — R319 Hematuria, unspecified: Secondary | ICD-10-CM | POA: Diagnosis not present

## 2022-12-09 DIAGNOSIS — N3281 Overactive bladder: Secondary | ICD-10-CM | POA: Diagnosis not present

## 2022-12-09 DIAGNOSIS — R4181 Age-related cognitive decline: Secondary | ICD-10-CM | POA: Diagnosis not present

## 2022-12-09 DIAGNOSIS — I10 Essential (primary) hypertension: Secondary | ICD-10-CM | POA: Diagnosis not present

## 2022-12-09 DIAGNOSIS — F32A Depression, unspecified: Secondary | ICD-10-CM | POA: Diagnosis not present

## 2023-01-01 ENCOUNTER — Telehealth: Payer: Self-pay

## 2023-01-01 NOTE — Telephone Encounter (Signed)
N/A unable to leave a message for patient to call back to schedule Medicare Annual Wellness Visit   Last AWV  05/08/21  Please schedule at anytime with [facility] if patient calls the office back.    30 Minutes appointment   Any questions, please call me at [phone]

## 2023-04-26 ENCOUNTER — Other Ambulatory Visit: Payer: Self-pay | Admitting: Internal Medicine

## 2023-05-05 DIAGNOSIS — I10 Essential (primary) hypertension: Secondary | ICD-10-CM | POA: Diagnosis not present

## 2023-05-05 DIAGNOSIS — I4811 Longstanding persistent atrial fibrillation: Secondary | ICD-10-CM | POA: Diagnosis not present

## 2023-05-05 DIAGNOSIS — R0609 Other forms of dyspnea: Secondary | ICD-10-CM | POA: Diagnosis not present

## 2023-05-05 DIAGNOSIS — Z9889 Other specified postprocedural states: Secondary | ICD-10-CM | POA: Diagnosis not present

## 2023-05-05 DIAGNOSIS — R319 Hematuria, unspecified: Secondary | ICD-10-CM | POA: Diagnosis not present

## 2023-05-05 DIAGNOSIS — I4892 Unspecified atrial flutter: Secondary | ICD-10-CM | POA: Diagnosis not present

## 2023-05-05 DIAGNOSIS — Z8679 Personal history of other diseases of the circulatory system: Secondary | ICD-10-CM | POA: Diagnosis not present

## 2023-06-16 ENCOUNTER — Other Ambulatory Visit: Payer: Self-pay | Admitting: Internal Medicine

## 2023-06-27 DIAGNOSIS — I10 Essential (primary) hypertension: Secondary | ICD-10-CM | POA: Diagnosis not present

## 2023-06-27 DIAGNOSIS — R41841 Cognitive communication deficit: Secondary | ICD-10-CM | POA: Diagnosis not present

## 2023-06-27 DIAGNOSIS — F32A Depression, unspecified: Secondary | ICD-10-CM | POA: Diagnosis not present

## 2024-12-06 ENCOUNTER — Telehealth: Payer: Self-pay | Admitting: *Deleted

## 2024-12-06 NOTE — Transitions of Care (Post Inpatient/ED Visit) (Signed)
" ° °  12/06/2024  Name: Claudia [name] MRN: [id number] DOB: [birth year]  Today's TOC FU Call Status: Today's TOC FU Call Status:: Unsuccessful Call (1st Attempt) Unsuccessful Call (1st Attempt) Date: 12/06/24  Attempted to reach the patient regarding the most recent Inpatient visit.  Phone rang multiple times without physical or voice mail pick up- unable to leave message   Follow Up Plan: Additional outreach attempts will be made to reach the patient to complete the Transitions of Care (Post Inpatient/ED visit) call.   Pls call/ message for questions,  Masaki Rothbauer Mckinney Allison Silva, RN, BSN, CCRN Alumnus RN Care Manager  Transitions of Care  [facility] [phone]: direct office  "

## 2024-12-07 ENCOUNTER — Telehealth: Payer: Self-pay | Admitting: *Deleted

## 2024-12-07 NOTE — Transitions of Care (Post Inpatient/ED Visit) (Signed)
" ° °  12/07/2024  Name: Claudia [name] MRN: [id number] DOB: 1945/11/22  Today's TOC FU Call Status: Today's TOC FU Call Status:: Unsuccessful Call (2nd Attempt) Unsuccessful Call (2nd Attempt) Date: 12/07/24  Attempted to reach the patient regarding the most recent Inpatient visit.  Phone rang multiple times without physical or voice mail pick up- unable to leave message   Follow Up Plan: Additional outreach attempts will be made to reach the patient to complete the Transitions of Care (Post Inpatient/ED visit) call.   Pls call/ message for questions,  Layal Javid Mckinney Trung Wenzl, RN, BSN, CCRN Alumnus RN Care Manager  Transitions of Care  [facility] [phone]: direct office  "
